# Patient Record
Sex: Male | Born: 1940 | Race: Black or African American | Hispanic: No | Smoking: Never smoker
Health system: Southern US, Community
[De-identification: ages and names within clinical notes are randomized; demographics above are authoritative.]

## PROBLEM LIST (undated history)

## (undated) DIAGNOSIS — Z972 Presence of dental prosthetic device (complete) (partial): Secondary | ICD-10-CM

## (undated) DIAGNOSIS — R972 Elevated prostate specific antigen [PSA]: Secondary | ICD-10-CM

## (undated) DIAGNOSIS — R351 Nocturia: Secondary | ICD-10-CM

## (undated) DIAGNOSIS — Z9189 Other specified personal risk factors, not elsewhere classified: Secondary | ICD-10-CM

## (undated) DIAGNOSIS — B999 Unspecified infectious disease: Secondary | ICD-10-CM

## (undated) DIAGNOSIS — C61 Malignant neoplasm of prostate: Secondary | ICD-10-CM

## (undated) DIAGNOSIS — I509 Heart failure, unspecified: Secondary | ICD-10-CM

## (undated) DIAGNOSIS — I10 Essential (primary) hypertension: Secondary | ICD-10-CM

## (undated) HISTORY — PX: LUMBAR SPINE SURGERY: SHX701

## (undated) HISTORY — PX: INGUINAL HERNIA REPAIR: SUR1180

---

## 2014-09-06 ENCOUNTER — Other Ambulatory Visit: Payer: Self-pay | Admitting: Urology

## 2014-09-15 ENCOUNTER — Encounter (HOSPITAL_BASED_OUTPATIENT_CLINIC_OR_DEPARTMENT_OTHER): Payer: Self-pay | Admitting: *Deleted

## 2014-09-15 NOTE — Progress Notes (Signed)
NPO AFTER MN. ARRIVE AT 0715. NEEDS ISTAT AND EKG. WILL DO FLEET ENEMA AM DOS.  PT TO BRING NAME OF BP MED. WHICH HE TAKES IN THE EVENING.

## 2014-09-15 NOTE — Progress Notes (Signed)
   09/15/14 1241  OBSTRUCTIVE SLEEP APNEA  Have you ever been diagnosed with sleep apnea through a sleep study? No  Do you snore loudly (loud enough to be heard through closed doors)?  1  Do you often feel tired, fatigued, or sleepy during the daytime? 0  Has anyone observed you stop breathing during your sleep? 0  Do you have, or are you being treated for high blood pressure? 1  BMI more than 35 kg/m2? 0  Age over 72 years old? 1  Gender: 1  Obstructive Sleep Apnea Score 4  Score 4 or greater  Results sent to PCP

## 2014-09-16 ENCOUNTER — Encounter (HOSPITAL_BASED_OUTPATIENT_CLINIC_OR_DEPARTMENT_OTHER)
Admission: RE | Disposition: A | Discharge status: Home / Self Care | Payer: Self-pay | Source: Ambulatory Visit | Attending: Urology

## 2014-09-16 ENCOUNTER — Ambulatory Visit (HOSPITAL_BASED_OUTPATIENT_CLINIC_OR_DEPARTMENT_OTHER): Payer: Medicare Other | Admitting: Anesthesiology

## 2014-09-16 ENCOUNTER — Encounter (HOSPITAL_BASED_OUTPATIENT_CLINIC_OR_DEPARTMENT_OTHER): Payer: Self-pay | Admitting: *Deleted

## 2014-09-16 ENCOUNTER — Ambulatory Visit (HOSPITAL_BASED_OUTPATIENT_CLINIC_OR_DEPARTMENT_OTHER)
Admission: RE | Admit: 2014-09-16 | Discharge: 2014-09-16 | Disposition: A | Discharge status: Home / Self Care | Payer: Medicare Other | Source: Ambulatory Visit | Attending: Urology | Admitting: Urology

## 2014-09-16 DIAGNOSIS — C61 Malignant neoplasm of prostate: Secondary | ICD-10-CM | POA: Diagnosis not present

## 2014-09-16 DIAGNOSIS — I10 Essential (primary) hypertension: Secondary | ICD-10-CM | POA: Insufficient documentation

## 2014-09-16 DIAGNOSIS — R972 Elevated prostate specific antigen [PSA]: Secondary | ICD-10-CM | POA: Diagnosis present

## 2014-09-16 HISTORY — DX: Other specified personal risk factors, not elsewhere classified: Z91.89

## 2014-09-16 HISTORY — DX: Nocturia: R35.1

## 2014-09-16 HISTORY — PX: PROSTATE BIOPSY: SHX241

## 2014-09-16 HISTORY — DX: Presence of dental prosthetic device (complete) (partial): Z97.2

## 2014-09-16 HISTORY — DX: Essential (primary) hypertension: I10

## 2014-09-16 HISTORY — DX: Elevated prostate specific antigen (PSA): R97.20

## 2014-09-16 LAB — POCT I-STAT 4, (NA,K, GLUC, HGB,HCT)
Glucose, Bld: 97 mg/dL (ref 70–99)
HEMATOCRIT: 45 % (ref 39.0–52.0)
HEMOGLOBIN: 15.3 g/dL (ref 13.0–17.0)
Potassium: 4.9 mEq/L (ref 3.7–5.3)
SODIUM: 141 meq/L (ref 137–147)

## 2014-09-16 SURGERY — BIOPSY, PROSTATE, RECTAL APPROACH, WITH US GUIDANCE
Anesthesia: Monitor Anesthesia Care | Site: Prostate

## 2014-09-16 MED ORDER — MIDAZOLAM HCL 2 MG/2ML IJ SOLN
INTRAMUSCULAR | Status: AC
  Filled 2014-09-16: qty 2

## 2014-09-16 MED ORDER — LIDOCAINE HCL (CARDIAC) 20 MG/ML IV SOLN
INTRAVENOUS | Status: DC | PRN
  Administered 2014-09-16: 50 mg via INTRAVENOUS

## 2014-09-16 MED ORDER — FENTANYL CITRATE 0.05 MG/ML IJ SOLN
25.0000 ug | INTRAMUSCULAR | Status: DC | PRN
  Filled 2014-09-16: qty 1

## 2014-09-16 MED ORDER — FENTANYL CITRATE 0.05 MG/ML IJ SOLN
INTRAMUSCULAR | Status: DC | PRN
  Administered 2014-09-16: 100 ug via INTRAVENOUS

## 2014-09-16 MED ORDER — CIPROFLOXACIN IN D5W 400 MG/200ML IV SOLN
400.0000 mg | INTRAVENOUS | Status: DC
  Filled 2014-09-16: qty 200

## 2014-09-16 MED ORDER — LACTATED RINGERS IV SOLN
INTRAVENOUS | Status: DC
  Administered 2014-09-16: 09:00:00 via INTRAVENOUS
  Filled 2014-09-16: qty 1000

## 2014-09-16 MED ORDER — MIDAZOLAM HCL 5 MG/5ML IJ SOLN
INTRAMUSCULAR | Status: DC | PRN
  Administered 2014-09-16: 2 mg via INTRAVENOUS

## 2014-09-16 MED ORDER — ONDANSETRON HCL 4 MG/2ML IJ SOLN
4.0000 mg | Freq: Once | INTRAMUSCULAR | Status: DC | PRN
  Filled 2014-09-16: qty 2

## 2014-09-16 MED ORDER — FENTANYL CITRATE 0.05 MG/ML IJ SOLN
INTRAMUSCULAR | Status: AC
  Filled 2014-09-16: qty 2

## 2014-09-16 MED ORDER — EPHEDRINE SULFATE 50 MG/ML IJ SOLN
INTRAMUSCULAR | Status: DC | PRN
  Administered 2014-09-16 (×2): 10 mg via INTRAVENOUS
  Administered 2014-09-16: 5 mg via INTRAVENOUS

## 2014-09-16 MED ORDER — PROPOFOL 10 MG/ML IV EMUL
INTRAVENOUS | Status: DC | PRN
  Administered 2014-09-16: 75 ug/kg/min via INTRAVENOUS

## 2014-09-16 SURGICAL SUPPLY — 20 items
BAG DRN ANRFLXCHMBR STRAP LEK (BAG)
BAG URINE LEG 19OZ MD ST LTX (BAG) IMPLANT
CATH FOLEY 2WAY SLVR  5CC 16FR (CATHETERS)
CATH FOLEY 2WAY SLVR 5CC 16FR (CATHETERS) IMPLANT
DRESSING TELFA 8X3 (GAUZE/BANDAGES/DRESSINGS) ×1 IMPLANT
DRSG TEGADERM 4X4.75 (GAUZE/BANDAGES/DRESSINGS) IMPLANT
DRSG TEGADERM 8X12 (GAUZE/BANDAGES/DRESSINGS) IMPLANT
GAUZE SPONGE 4X4 12PLY STRL LF (GAUZE/BANDAGES/DRESSINGS) IMPLANT
GLOVE BIO SURGEON STRL SZ7 (GLOVE) ×3 IMPLANT
NDL SAFETY ECLIPSE 18X1.5 (NEEDLE) IMPLANT
NDL SPNL 22GX7 QUINCKE BK (NEEDLE) IMPLANT
NEEDLE HYPO 18GX1.5 SHARP (NEEDLE)
NEEDLE SPNL 22GX7 QUINCKE BK (NEEDLE) IMPLANT
PLUG CATH AND CAP STER (CATHETERS) IMPLANT
SET IRRIG Y TYPE TUR BLADDER L (SET/KITS/TRAYS/PACK) IMPLANT
SURGILUBE 2OZ TUBE FLIPTOP (MISCELLANEOUS) ×3 IMPLANT
SYR 20CC LL (SYRINGE) IMPLANT
SYRINGE 10CC LL (SYRINGE) IMPLANT
TOWEL OR 17X24 6PK STRL BLUE (TOWEL DISPOSABLE) ×3 IMPLANT
UNDERPAD 30X30 INCONTINENT (UNDERPADS AND DIAPERS) ×5 IMPLANT

## 2014-09-16 NOTE — H&P (Signed)
History of Present Illness Austin Oconnor is referred by Dr Collier Flowers. He was found to have a significantly elevated PSA on 07/20/14. It is 15.01. He states that he never had a PSA before. He has been having hesitancy, nocturia x 3 for the past 6-7 months. He denies dysuria, hematuria. One nephew died of prostate cancer at age 72.   Past Medical History Problems  1. History of hypertension (Z86.79)  Surgical History Problems  1. History of Back Surgery 2. History of Hernia Repair  Current Meds 1. Lisinopril 20 MG Oral Tablet;  Therapy: (Recorded:21Oct2015) to Recorded 2. Multi-Vitamin TABS;  Therapy: (Recorded:21Oct2015) to Recorded  Allergies Medication  1. No Known Drug Allergies  Family History Problems  1. Family history of myocardial infarction (Z82.49) : Father  Social History Problems    Caffeine use (F15.90)   Father deceased   Mother deceased   Never a smoker   Number of children   Retired  Review of Systems Genitourinary, constitutional, skin, eye, otolaryngeal, hematologic/lymphatic, cardiovascular, pulmonary, endocrine, musculoskeletal, gastrointestinal, neurological and psychiatric system(s) were reviewed and pertinent findings if present are noted.  Genitourinary: urinary frequency, nocturia, difficulty starting the urinary stream, weak urinary stream, urinary stream starts and stops, erectile dysfunction and initiating urination requires straining.  Eyes: blurred vision and diplopia.  ENT: sore throat and sinus problems.  Musculoskeletal: joint pain.    Vitals Vital Signs [Data Includes: Last 1 Day]  Recorded: 21Oct2015 10:36AM  Height: 5 ft 7 in Weight: 161 lb  BMI Calculated: 25.22 BSA Calculated: 1.84 Blood Pressure: 167 / 71 Heart Rate: 56 Respiration: 18  Physical Exam Constitutional: Well nourished and well developed . No acute distress.  ENT:. The ears and nose are normal in appearance.  Neck: The appearance of the neck is normal  and no neck mass is present.  Pulmonary: No respiratory distress and normal respiratory rhythm and effort.  Cardiovascular: Heart rate and rhythm are normal . No peripheral edema.  Abdomen: The abdomen is soft and nontender. No masses are palpated. No CVA tenderness. No hernias are palpable. No hepatosplenomegaly noted.  Rectal: The perineum is normal on inspection. He has a very tight sphincter. He complained of severe pain. I could not introduce my index finger in the rectum.  Genitourinary: Examination of the penis demonstrates no discharge, no masses, no lesions and a normal meatus. The scrotum is without lesions. The right epididymis is palpably normal and non-tender. The left epididymis is palpably normal and non-tender. The right testis is non-tender and without masses. The left testis is non-tender and without masses.  Lymphatics: The femoral and inguinal nodes are not enlarged or tender.  Skin: Normal skin turgor, no visible rash and no visible skin lesions.  Neuro/Psych:. Mood and affect are appropriate.    Results/Data Urine [Data Includes: Last 1 Day]   21Oct2015  COLOR STRAW   APPEARANCE CLEAR   SPECIFIC GRAVITY 1.010   pH 5.5   GLUCOSE NEG mg/dL  BILIRUBIN NEG   KETONE NEG mg/dL  BLOOD NEG   PROTEIN NEG mg/dL  UROBILINOGEN 0.2 mg/dL  NITRITE NEG   LEUKOCYTE ESTERASE NEG    Assessment Assessed  1. Elevated prostate specific antigen (PSA) (R97.2)  Plan Health Maintenance  1. UA With REFLEX; [Do Not Release]; Status:Complete;   Done: 21Oct2015 10:18AM  He needs prostate biopsy. That needs to be done under general anesthesia. The procedure, risks, benefits were explained to the patient and his son and daughter. The risks include but are not  limited to hemorrhage: blood in the urine, stools, semen, infection, sepsis. They understand and are agreeable.

## 2014-09-16 NOTE — Discharge Instructions (Signed)
Post Anesthesia Home Care Instructions  Activity: Get plenty of rest for the remainder of the day. A responsible adult should stay with you for 24 hours following the procedure.  For the next 24 hours, DO NOT: -Drive a car -Paediatric nurse -Drink alcoholic beverages -Take any medication unless instructed by your physician -Make any legal decisions or sign important papers.  Meals: Start with liquid foods such as gelatin or soup. Progress to regular foods as tolerated. Avoid greasy, spicy, heavy foods. If nausea and/or vomiting occur, drink only clear liquids until the nausea and/or vomiting subsides. Call your physician if vomiting continues.  Special Instructions/Symptoms: Your throat may feel dry or sore from the anesthesia or the breathing tube placed in your throat during surgery. If this causes discomfort, gargle with warm salt water. The discomfort should disappear within 24 hours.  Transurethral Resection of the Prostate Care After Refer to this sheet in the next few weeks. These instructions provide you with information on caring for yourself after your procedure. Your caregiver also may give you specific instructions. Your treatment has been planned according to current medical practices, but complications sometimes occur. Call your caregiver if you have any problems or questions after your procedure. HOME CARE INSTRUCTIONS  Recovery can take 4-6 weeks. Avoid alcohol, caffeinated drinks, and spicy foods for 2 weeks after your procedure. Drink enough fluids to keep your urine clear or pale yellow. Urinate as soon as you feel the urge to do so. Do not try to hold your urine for long periods of time. During recovery you may experience pain caused by bladder spasms, which result in a very intense urge to urinate. Take all medicines as directed by your caregiver, including medicines for pain. Try to limit the amount of pain medicines you take because it can cause constipation. If you do  become constipated, do not strain to move your bowels. Straining can increase bleeding. Constipation can be minimized by increasing the amount fluids and fiber in your diet. Your caregiver also may prescribe a stool softener. Do not lift heavy objects (more than 5 lb [2.25 kg]) or perform exercises that cause you to strain for at least 1 month after your procedure. When sitting, you may want to sit in a soft chair or use a cushion. For the first 10 days after your procedure, avoid the following activities:  Running.  Strenuous work.  Long walks.  Riding in a car for extended periods.  Sex. SEEK MEDICAL CARE IF:  You have difficulty urinating.  You have blood in your urine that does not go away after you rest or increase your fluid intake.  You have swelling in your penis or scrotum. SEEK IMMEDIATE MEDICAL CARE IF:   You are suddenly unable to urinate.  You notice blood clots in your urine.  You have chills.  You have a fever.  You have pain in your back or lower abdomen.  You have pain or swelling in your legs. MAKE SURE YOU:   Understand these instructions.  Will watch your condition.  Will get help right away if you are not doing well or get worse. Document Released: 10/28/2005 Document Revised: 07/22/2012 Document Reviewed: 12/06/2011 [facility] Patient Information 2015 [place], [place]. This information is not intended to replace advice given to you by your health care provider. Make sure you discuss any questions you have with your health care provider. Transrectal Ultrasound-Guided Biopsy A transrectal ultrasound-guided biopsy is a procedure to take samples of tissue from your prostate. Ultrasound  images are used to guide the procedure. It is usually done to check the prostate gland for cancer. BEFORE THE PROCEDURE  Do not eat or drink after midnight on the night before your procedure.  Take medicines as your doctor tells you.  Your doctor may have you stop taking  some medicines 5-7 days before the procedure.  You will be given an enema before your procedure. During an enema, a liquid is put into your butt (rectum) to clear out waste.  You may have lab tests the day of your procedure.  Make plans to have someone drive you home. PROCEDURE  You will be given medicine to help you relax before the procedure. An IV tube will be put into one of your veins. It will be used to give fluids and medicine.  You will be given medicine to reduce the risk of infection (antibiotic).  You will be placed on your side.  A probe with gel will be put in your butt. This is used to take pictures of your prostate and the area around it.  A medicine to numb the area is put into your prostate.  A biopsy needle is then inserted and guided to your prostate.  Samples of prostate tissue are taken. The needle is removed.  The samples are sent to a lab to be checked. Results are usually back in 2-3 days. AFTER THE PROCEDURE  You will be taken to a room where you will be watched until you are doing okay.  You may have some pain in the area around your butt. You will be given medicines for this.  You may be able to go home the same day. Sometimes, an overnight stay in the hospital is needed. Document Released: 10/16/2009 Document Revised: 11/02/2013 Document Reviewed: 06/16/2013 [facility] Patient Information 2015 [place], [place]. This information is not intended to replace advice given to you by your health care provider. Make sure you discuss any questions you have with your health care provider.

## 2014-09-16 NOTE — Op Note (Signed)
Austin Oconnor is a 73 y.o.   09/16/2014  Monitor Anesthesia Care  Preop diagnosis: Elevated PSA  Postop diagnosis: Same  Procedure done: Ultrasound guided prostate biopsy  Surgeon: Charlene Brooke. [name]  Anesthesia: Monitored anesthesia care  Indication:patient is a 73 years old male who was found to have a significantly elevated PSA of 15.01 by his primary care physician on 07/20/2014.  I could not perform a digital rectal examination because he has a tight anal sphincter.  He is scheduled today for ultrasound prostate biopsy.  Procedure:The patient was identified by his wrist band and proper timeout was taken.  A 10 MHz transrectal ultrasound probe was inserted in the rectum. The seminal vesicles appear normal. There are several calcifications at the base and mid gland. There is no evidence of hypoechoic nodules. The prostate width is 4.78 cm. The height is 3.78 cm. The length is 4.45 cm. The prostate volume is 42.18 cm.  2 biopsies were taken of the right apex: One at the right lateral and the other one at the right medial apex. 2 biopsies of the right mid gland were done: 1 at the right mid lateral and one at the medial mid gland. Then 2 biopsies of the right base were done: 1 at the right lateral and one at the right medial base.  Then 2 biopsies of the left apex were done: 1 at the lateral apex and one at the medial apex. 2 biopsies of left mid gland were done: 1 on the lateral mid gland and one at the medial mid gland. 2 biopsies of the left base were done: 1 at the lateral base and one at the medial base.  There was no evidence of bleeding at the end of the procedure. The ultrasound probe was then removed.  Patient tolerated procedure well left the OR in satisfactory condition to postanesthesia care unit.

## 2014-09-16 NOTE — Anesthesia Postprocedure Evaluation (Signed)
  Anesthesia Post-op Note  Patient: Austin Oconnor  Procedure(s) Performed: Procedure(s) (LRB): BIOPSY TRANSRECTAL ULTRASONIC PROSTATE (TUBP) (N/A)  Patient Location: PACU  Anesthesia Type: MAC  Level of Consciousness: awake and alert   Airway and Oxygen Therapy: Patient Spontanous Breathing  Post-op Pain: mild  Post-op Assessment: Post-op Vital signs reviewed, Patient's Cardiovascular Status Stable, Respiratory Function Stable, Patent Airway and No signs of Nausea or vomiting  Last Vitals:  Filed Vitals:   09/16/14 1000  BP: 114/59  Pulse:   Temp:   Resp:     Post-op Vital Signs: stable   Complications: No apparent anesthesia complications

## 2014-09-16 NOTE — Transfer of Care (Signed)
Immediate Anesthesia Transfer of Care Note  Patient: Austin Oconnor  Procedure(s) Performed: Procedure(s): BIOPSY TRANSRECTAL ULTRASONIC PROSTATE (TUBP) (N/A)  Patient Location: PACU  Anesthesia Type:General  Level of Consciousness: sedated and responds to stimulation  Airway & Oxygen Therapy: Patient Spontanous Breathing and Patient connected to face mask oxygen  Post-op Assessment: Report given to PACU RN  Post vital signs: Reviewed and stable  Complications: No apparent anesthesia complications

## 2014-09-16 NOTE — Anesthesia Preprocedure Evaluation (Addendum)
Anesthesia Evaluation  Patient identified by MRN, date of birth, ID band Patient awake    Reviewed: Allergy & Precautions, H&P , NPO status , Patient's Chart, lab work & pertinent test results  History of Anesthesia Complications Negative for: history of anesthetic complications  Airway Mallampati: III  TM Distance: >3 FB Neck ROM: Full    Dental  (+) Partial Lower, Partial Upper, Dental Advisory Given   Pulmonary neg pulmonary ROS,  breath sounds clear to auscultation  Pulmonary exam normal       Cardiovascular Exercise Tolerance: Good hypertension, Pt. on medications Rhythm:Regular Rate:Normal     Neuro/Psych negative neurological ROS  negative psych ROS   GI/Hepatic negative GI ROS, Neg liver ROS,   Endo/Other  negative endocrine ROS  Renal/GU negative Renal ROS  negative genitourinary   Musculoskeletal negative musculoskeletal ROS (+)   Abdominal   Peds negative pediatric ROS (+)  Hematology negative hematology ROS (+)   Anesthesia Other Findings   Reproductive/Obstetrics negative OB ROS                           Anesthesia Physical Anesthesia Plan  ASA: II  Anesthesia Plan: MAC   Post-op Pain Management:    Induction: Intravenous  Airway Management Planned: Nasal Cannula  Additional Equipment:   Intra-op Plan:   Post-operative Plan: Extubation in OR  Informed Consent: I have reviewed the patients History and Physical, chart, labs and discussed the procedure including the risks, benefits and alternatives for the proposed anesthesia with the patient or authorized representative who has indicated his/her understanding and acceptance.   Dental advisory given  Plan Discussed with: CRNA  Anesthesia Plan Comments:        Anesthesia Quick Evaluation

## 2014-09-19 ENCOUNTER — Encounter (HOSPITAL_BASED_OUTPATIENT_CLINIC_OR_DEPARTMENT_OTHER): Payer: Self-pay | Admitting: Urology

## 2014-09-22 ENCOUNTER — Other Ambulatory Visit (HOSPITAL_COMMUNITY): Payer: Self-pay | Admitting: Urology

## 2014-09-22 DIAGNOSIS — C61 Malignant neoplasm of prostate: Secondary | ICD-10-CM

## 2014-09-30 ENCOUNTER — Encounter (HOSPITAL_COMMUNITY)
Admission: RE | Admit: 2014-09-30 | Discharge: 2014-09-30 | Disposition: A | Discharge status: Home / Self Care | Payer: Medicare Other | Source: Ambulatory Visit | Attending: Urology | Admitting: Urology

## 2014-09-30 DIAGNOSIS — C61 Malignant neoplasm of prostate: Secondary | ICD-10-CM | POA: Insufficient documentation

## 2014-09-30 MED ORDER — TECHNETIUM TC 99M MEDRONATE IV KIT
26.4000 | PACK | Freq: Once | INTRAVENOUS | Status: AC | PRN
  Administered 2014-09-30: 26.4 via INTRAVENOUS

## 2014-10-04 ENCOUNTER — Other Ambulatory Visit (HOSPITAL_COMMUNITY): Payer: Self-pay | Admitting: Urology

## 2014-10-04 ENCOUNTER — Ambulatory Visit (HOSPITAL_COMMUNITY)
Admission: RE | Admit: 2014-10-04 | Discharge: 2014-10-04 | Disposition: A | Discharge status: Home / Self Care | Payer: Medicare Other | Source: Ambulatory Visit | Attending: Urology | Admitting: Urology

## 2014-10-04 DIAGNOSIS — C61 Malignant neoplasm of prostate: Secondary | ICD-10-CM | POA: Insufficient documentation

## 2014-10-11 ENCOUNTER — Other Ambulatory Visit (HOSPITAL_COMMUNITY): Payer: Self-pay | Admitting: Urology

## 2014-10-11 DIAGNOSIS — R948 Abnormal results of function studies of other organs and systems: Secondary | ICD-10-CM

## 2014-10-24 ENCOUNTER — Ambulatory Visit (HOSPITAL_COMMUNITY)
Admission: RE | Admit: 2014-10-24 | Discharge: 2014-10-24 | Disposition: A | Discharge status: Home / Self Care | Payer: Medicare Other | Source: Ambulatory Visit | Attending: Urology | Admitting: Urology

## 2014-10-24 DIAGNOSIS — Z8546 Personal history of malignant neoplasm of prostate: Secondary | ICD-10-CM | POA: Insufficient documentation

## 2014-10-24 DIAGNOSIS — R948 Abnormal results of function studies of other organs and systems: Secondary | ICD-10-CM | POA: Diagnosis not present

## 2014-10-24 DIAGNOSIS — M25551 Pain in right hip: Secondary | ICD-10-CM | POA: Diagnosis not present

## 2014-10-24 LAB — POCT I-STAT CREATININE: CREATININE: 0.8 mg/dL (ref 0.50–1.35)

## 2014-10-24 MED ORDER — GADOBENATE DIMEGLUMINE 529 MG/ML IV SOLN
15.0000 mL | Freq: Once | INTRAVENOUS | Status: AC | PRN
  Administered 2014-10-24: 15 mL via INTRAVENOUS

## 2014-10-28 ENCOUNTER — Telehealth: Payer: Self-pay | Admitting: Oncology

## 2014-10-28 NOTE — Telephone Encounter (Signed)
LEFT MESSAGE FOR PATIENT TO RETURN CALL TO SCHEDULE NP APPT [phone]

## 2014-11-02 ENCOUNTER — Telehealth: Payer: Self-pay | Admitting: Oncology

## 2014-11-02 NOTE — Telephone Encounter (Signed)
LEFT MESSAGE FOR PATIENT TO RETURN CALL TO SCHEDULE NP APPT.  °

## 2014-11-17 ENCOUNTER — Ambulatory Visit (HOSPITAL_BASED_OUTPATIENT_CLINIC_OR_DEPARTMENT_OTHER): Payer: Medicare Other | Admitting: Oncology

## 2014-11-17 ENCOUNTER — Other Ambulatory Visit: Payer: Medicare Other

## 2014-11-17 ENCOUNTER — Ambulatory Visit: Payer: Medicare Other

## 2014-11-17 ENCOUNTER — Encounter (INDEPENDENT_AMBULATORY_CARE_PROVIDER_SITE_OTHER): Payer: Self-pay

## 2014-11-17 ENCOUNTER — Encounter: Payer: Self-pay | Admitting: Oncology

## 2014-11-17 VITALS — BP 152/73 | HR 68 | Temp 97.7°F | Resp 18 | Ht 68.0 in | Wt 162.4 lb

## 2014-11-17 DIAGNOSIS — M259 Joint disorder, unspecified: Secondary | ICD-10-CM

## 2014-11-17 DIAGNOSIS — C61 Malignant neoplasm of prostate: Secondary | ICD-10-CM

## 2014-11-17 NOTE — Consult Note (Signed)
Reason for Referral: Prostate cancer.   HPI: 74 year old gentleman currently of [place] where he lived the majority of his life. He is a rather healthy gentleman without any significant comorbid conditions and recently evaluated by Dr. Janice Norrie. For an elevated PSA of 15.01. He also have had symptoms of nocturia and hesitancy and subsequently underwent a prostate biopsy on 09/16/2014. 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 Bone scan for staging purposes was obtained on 09/30/2014 which showed increased uptake in the intertrochanteric region of the left femur but really no other widespread disease. He underwent a MRI of the pelvis which showed multifocal osseous disease with a larger lesion in the anterior trochanteric region of the proximal left femur and predisposing that lesion to pathological fracture. Patient was offered androgen deprivation and bone directed therapy but refused and I was asked to comment about the role of chemotherapy in his care. Clinically, he is asymptomatic at this time. He is no longer reporting any major urinary symptoms. He does not report any frequency or urgency. His nocturia has improved. He does not report any headaches or blurry vision or syncope. He does not report any fevers, chills, weight loss or appetite changes. He does not report any chest pain, shortness of breath, cough or hemoptysis. He does not report any nausea, vomiting, abdominal pain, hematochezia or melena. He does not report any palpitation or leg edema. He does not report any hematuria or dysuria. He does not report any skeletal complaints. He does not report any hip pain back pain or any shoulder pain. He is able to ambulate without any difficulties. Rest of his review of systems unremarkable.   Past Medical History  Diagnosis Date  . Elevated PSA   . Hypertension   . Nocturia   . Wears partial dentures   . At risk for sleep  apnea     STOP-BANG= 4      :  Past Surgical History  Procedure Laterality Date  . Inguinal hernia repair Bilateral 1990  &  1960s  . Lumbar spine surgery  age 37's  . Prostate biopsy N/A 09/16/2014    Procedure: BIOPSY TRANSRECTAL ULTRASONIC PROSTATE (TUBP);  Surgeon: Arvil Persons, MD;  Location: [facility];  Service: Urology;  Laterality: N/A;  :   Current Outpatient Prescriptions  Medication Sig Dispense Refill  . Cholecalciferol (VITAMIN D3) 1000 UNITS CAPS Take 1 capsule by mouth daily.    Marland Kitchen lisinopril (PRINIVIL,ZESTRIL) 20 MG tablet Take 20 mg by mouth daily.    . Multiple Vitamin (MULTIVITAMIN) tablet Take 1 tablet by mouth daily.    . vitamin B-12 (CYANOCOBALAMIN) 1000 MCG tablet Take 1,000 mcg by mouth daily.     No current facility-administered medications for this visit.     No Known Allergies:  No family history on file.:  History   Social History  . Marital Status: Widowed    Spouse Name: N/A    Number of Children: N/A  . Years of Education: N/A   Occupational History  . Not on file.   Social History Main Topics  . Smoking status: Never Smoker   . Smokeless tobacco: Never Used  . Alcohol Use: No  . Drug Use: No  . Sexual Activity: Not on file   Other Topics Concern  . Not on file   Social History Narrative  :  Pertinent items are noted in HPI.  Exam:  Blood pressure 152/73, pulse 68, temperature 97.7 F (36.5 C), temperature source Oral, resp. rate 18, height 5\' 8"  (1.727 m), weight 162 lb 6.4 oz (73.664 kg), SpO2 99 %. General appearance: alert and cooperative Head: Normocephalic, without obvious abnormality Throat: lips, mucosa, and tongue normal; teeth and gums normal Neck: no adenopathy Back: negative Resp: clear to auscultation bilaterally Chest wall: no tenderness Cardio: regular rate and rhythm, S1, S2 normal, no murmur, click, rub or gallop GI: soft, non-tender; bowel sounds normal; no masses,  no  organomegaly Extremities: extremities normal, atraumatic, no cyanosis or edema Pulses: 2+ and symmetric Skin: Skin color, texture, turgor normal. No rashes or lesions Lymph nodes: Cervical, supraclavicular, and axillary nodes normal.  CBC    Component Value Date/Time   HGB 15.3 09/16/2014 0851   HCT 45.0 09/16/2014 0851      Chemistry      Component Value Date/Time   NA 141 09/16/2014 0851   K 4.9 09/16/2014 0851   CREATININE 0.80 10/24/2014 1259   No results found for: CALCIUM, ALKPHOS, AST, ALT, BILITOT     Mr Pelvis W Wo Contrast  10/25/2014   CLINICAL DATA:  Right hip pain with abnormal bone scan. History of prostate cancer. No known injury. Initial encounter.  EXAM: MRI PELVIS WITHOUT AND WITH CONTRAST  TECHNIQUE: Multiplanar multisequence MR imaging of the pelvis was performed both before and after administration of intravenous contrast.  CONTRAST:  83mL MULTIHANCE GADOBENATE DIMEGLUMINE 529 MG/ML IV SOLN  COMPARISON:  Whole body bone scan 09/30/2014. Radiographs 10/04/2014  FINDINGS: There is a large lytic lesion in the proximal left femur corresponding with the bone scan abnormality. This involves the intertrochanteric and subtrochanteric regions, partially destroying the cortex posteromedially in the region of the lesser trochanter. The lesion measures approximately 5.4 x 3.2 cm on coronal image number 14 and shows heterogeneous enhancement following contrast and surrounding bone marrow edema. No definite pathologic fracture is demonstrated at this time, although the patient is at risk for that.  Multiple other smaller enhancing metastases are identified throughout the pelvis. There is a tiny lesion in the proximal right femoral diaphysis. No other cortical destruction or impending pathologic fractures demonstrated. There are no soft tissue masses.  The prostate gland demonstrates heterogeneous enlargement and enhancement, measuring approximately 5.1 x 5.2 cm transverse. No enlarged  pelvic lymph nodes demonstrated.  There are degenerative and postsurgical changes within the lower lumbar spine. Both common hamstring tendons demonstrate tendinosis and partial tearing. Small scrotal hydroceles are noted bilaterally.  IMPRESSION: 1. Multifocal osseous metastatic disease. There is a large lesion in the intertrochanteric region of the proximal left femur, corresponding with the bone scan abnormality and predisposing the patient to pathologic fracture. 2. Heterogeneous enlargement and enhancement of the prostate gland consistent with primary prostate cancer. No pelvic adenopathy identified.   Electronically Signed   By: Camie Patience M.D.   On: 10/25/2014 08:43    Assessment and Plan:   74 year old gentleman with the following issues:  1. 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 His diagnosis was made on 09/19/2014. His imaging studies did show metastatic disease into the pelvic bones predominantly in the left hip area. The natural course of this disease was discussed extensively with the patient and his family. I explained to him that the his disease is  incurable but certainly would be manageable intradermal for an extended period of time with androgen deprivation. Different forms of androgen deprivation was discussed with the patient including chemical castration versus surgical castration.  The role of systemic chemotherapy in the hormone sensitive prostate cancer setting was discussed. I feel it ends some value to androgen deprivation but it isn't by itself is unclear whether it adds any value. He had adamantly refused androgen deprivation leading up to this point and it is unclear to me how much chemotherapy would do by itself. The standard of care at this time would be androgen deprivation and anything short of that will definitely would not offer  the best treatment option.  He still very hesitant to proceed with hormone therapy due to the fear of side effects. He is very concerned about sexual function and he is not really sure he would proceed with androgen deprivation knowing that. I explained to him that without androgen deprivation, he is risking further testis is of his cancer and ultimately more suffering and ultimately death from his cancer which is very possible.  For the time being he will continue to think about it and will let me know in the future.  2. Left hip lesion: This is a risk of potential pathological fracture. He is asymptomatic at this time and ideally I would like to have him evaluated by orthopedics as well as radiation oncology. I will make these referrals for him if he is willing to proceed with that.  3. Bone directed therapy: He will certainly benefit from Xgeva to prevent any further bone loss and any skeletal related complications.  He will think about our discussion today and will let me know in the near future and I will proceed per his recommendations.

## 2014-11-17 NOTE — Progress Notes (Signed)
Checked in new pt with no financial concerns at this time.  Pt has 2 insurances so financial assistance may not be needed but he has Raquel's card for any billing questions or concerns.

## 2014-11-17 NOTE — Progress Notes (Signed)
Please see consult note.  

## 2015-12-27 ENCOUNTER — Other Ambulatory Visit: Payer: Self-pay | Admitting: Urology

## 2015-12-27 DIAGNOSIS — C61 Malignant neoplasm of prostate: Secondary | ICD-10-CM

## 2016-01-10 ENCOUNTER — Encounter (HOSPITAL_COMMUNITY)
Admission: RE | Admit: 2016-01-10 | Discharge: 2016-01-10 | Disposition: A | Discharge status: Home / Self Care | Payer: Medicare Other | Source: Ambulatory Visit | Attending: Urology | Admitting: Urology

## 2016-01-10 DIAGNOSIS — R937 Abnormal findings on diagnostic imaging of other parts of musculoskeletal system: Secondary | ICD-10-CM | POA: Diagnosis not present

## 2016-01-10 DIAGNOSIS — C61 Malignant neoplasm of prostate: Secondary | ICD-10-CM | POA: Insufficient documentation

## 2016-01-10 MED ORDER — TECHNETIUM TC 99M MEDRONATE IV KIT
25.0000 | PACK | Freq: Once | INTRAVENOUS | Status: AC | PRN
  Administered 2016-01-10: 25.9 via INTRAVENOUS

## 2016-01-30 NOTE — Progress Notes (Signed)
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 To received Lupron as well as Xgeva every four months.  Pain on a scale of 0-10 is: Denies pain in his left femur. Reports his left leg at the hip becomes sore after he does leg exercises at the [facility].   Reports hot flashes. Gleason 4+5=9. 42 cc volume prostate.   If Spine Met(s), symptoms, if any, include:  Bowel/Bladder retention or incontinence (please describe): strong urine stream, decreased libido and erectile dysfunction. Denies dysuria or hematuria.  Numbness or weakness in extremities (please describe): No  Current Decadron regimen, if applicable: No  Ambulatory status? Walker? Wheelchair?: Ambulatory  SAFETY ISSUES:  Prior radiation?   Pacemaker/ICD? no  Possible current pregnancy? no  Is the patient on methotrexate? no  Current Complaints / other details:  75 year old male. NKDA. Retired. One son and one daughter. Reports hot flashes.

## 2016-02-05 ENCOUNTER — Ambulatory Visit
Admission: RE | Admit: 2016-02-05 | Discharge: 2016-02-05 | Disposition: A | Discharge status: Home / Self Care | Payer: Medicare Other | Source: Ambulatory Visit | Attending: Radiation Oncology | Admitting: Radiation Oncology

## 2016-02-05 ENCOUNTER — Encounter: Payer: Self-pay | Admitting: Radiation Oncology

## 2016-02-05 ENCOUNTER — Ambulatory Visit: Payer: Medicare Other

## 2016-02-05 VITALS — BP 166/81 | HR 52 | Resp 16 | Ht 68.0 in | Wt 159.7 lb

## 2016-02-05 DIAGNOSIS — Z510 Encounter for antineoplastic radiation therapy: Secondary | ICD-10-CM | POA: Diagnosis not present

## 2016-02-05 DIAGNOSIS — R351 Nocturia: Secondary | ICD-10-CM | POA: Diagnosis not present

## 2016-02-05 DIAGNOSIS — C7951 Secondary malignant neoplasm of bone: Secondary | ICD-10-CM | POA: Diagnosis present

## 2016-02-05 DIAGNOSIS — I10 Essential (primary) hypertension: Secondary | ICD-10-CM | POA: Diagnosis not present

## 2016-02-05 DIAGNOSIS — C61 Malignant neoplasm of prostate: Secondary | ICD-10-CM | POA: Diagnosis present

## 2016-02-05 NOTE — Progress Notes (Signed)
Radiation Oncology         [phone] ________________________________  Initial Outpatient Consultation  Name: Austin Oconnor  MRN: [id number]  Date: 02/05/2016  DOB: 1940-12-25  CC:No PCP Per Patient  Austin Gallo, MD   REFERRING PHYSICIAN: Franchot Gallo, MD  DIAGNOSIS: 75 y.o. 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    ICD-9-CM ICD-10-CM   1. Malignant neoplasm of prostate ([place]) 185 C61   2. 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 gentleman.  He was noted to have an elevated PSA of 15.01 by his primary care physician, Dr. Collier Oconnor, on 07/20/14.  Accordingly, he was referred for evaluation in urology by Dr. Janice Oconnor on 09/16/14. A digital rectal examination was unable to be performed at that time due to the patient having a tight anal sphincter.  The patient proceeded to transrectal ultrasound with 12 biopsies of the prostate on 09/16/14.  The prostate volume measured 42 cc.  Out of 12 core biopsies, all were positive.  The maximum Gleason score was 4+5=9 and this was seen in the right base lateral and right base medial.   Bone scan on 09/30/14 revealed evidence of osseous metastatic disease in the intertrochanteric region of the left femur. MRI of the pelvis on 10/24/14 was performed, confirming metastatic involvement in the left intertrochanteric femur and multiple other metastases in the pelvis.  The patient started [place] on 11/28/14 and his first Lupron injection a month later. Delton See was started on 06/06/15. The patient's PSA on 05/05/15 = 0.43, 11/14/14 = 0.25, and 01/10/16 = 0.25 with his testosterone = 20.  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  The patient has kindly been referred today for discussion of potential radiation treatment  options.  PREVIOUS RADIATION THERAPY: No  PAST MEDICAL HISTORY:  has a past medical history of Elevated PSA; Hypertension; Nocturia; Wears partial dentures; and At risk for sleep apnea.    PAST SURGICAL HISTORY: Past Surgical History  Procedure Laterality Date  . Inguinal hernia repair Bilateral 1990  &  1960s  . Lumbar spine surgery  age 26's  . Prostate biopsy N/A 09/16/2014    Procedure: BIOPSY TRANSRECTAL ULTRASONIC PROSTATE (TUBP);  Surgeon: Austin Persons, MD;  Location: [facility];  Service: Urology;  Laterality: N/A;    FAMILY HISTORY: family history is negative for Cancer.  SOCIAL HISTORY:  reports that he has never smoked. He has never used smokeless tobacco. He reports that he does not drink alcohol or use illicit drugs.  ALLERGIES: Review of patient's allergies indicates no known allergies.  MEDICATIONS:  Current Outpatient Prescriptions  Medication Sig Dispense Refill  . Cholecalciferol (VITAMIN D3) 1000 UNITS CAPS Take 1 capsule by mouth daily.    Marland Kitchen lisinopril (PRINIVIL,ZESTRIL) 20 MG tablet Take 20 mg by mouth daily.    . Multiple Vitamin (MULTIVITAMIN) tablet Take 1 tablet by mouth daily.    . vitamin B-12 (CYANOCOBALAMIN) 1000 MCG tablet Take 1,000 mcg by mouth daily.     No current facility-administered medications for this encounter.    REVIEW OF SYSTEMS:  A 15 point review of systems is documented in the electronic medical record. This was obtained by the nursing staff. However, I reviewed this with the patient to discuss relevant  findings and make appropriate changes.  Pertinent items are noted in HPI..  The patient completed an IPSS and IIEF questionnaire.  His IPSS score was 13 indicating moderate urinary outflow obstructive symptoms.  He indicated that his erectile function is able to complete sexual activity on most attempts.  The patient only reports pain in his left leg at the hip after doing leg exercises at the gym.   PHYSICAL EXAM: This  patient is in no acute distress.  He is alert and oriented.   height is 5\' 8"  (1.727 m) and weight is 159 lb 11.2 oz (72.439 kg). His blood pressure is 166/81 and his pulse is 52. His respiration is 16 and oxygen saturation is 99%.  He exhibits no respiratory distress or labored breathing.  He appears neurologically intact.  His mood is pleasant.  His affect is appropriate.    KPS = 100  100 - Normal; no complaints; no evidence of disease. 90   - Able to carry on normal activity; minor signs or symptoms of disease. 80   - Normal activity with effort; some signs or symptoms of disease. 90   - Cares for self; unable to carry on normal activity or to do active work. 60   - Requires occasional assistance, but is able to care for most of his personal needs. 50   - Requires considerable assistance and frequent medical care. 27   - Disabled; requires special care and assistance. 21   - Severely disabled; hospital admission is indicated although death not imminent. 11   - Very sick; hospital admission necessary; active supportive treatment necessary. 10   - Moribund; fatal processes progressing rapidly. 0     - Dead  Karnofsky DA, Abelmann Austin Oconnor, Austin Oconnor LS and Austin Oconnor [facility] [phone]) The use of the nitrogen mustards in the palliative treatment of carcinoma: with particular reference to bronchogenic carcinoma Cancer 1 [phone]   LABORATORY DATA:  Lab Results  Component Value Date   HGB 15.3 09/16/2014   HCT 45.0 09/16/2014   Lab Results  Component Value Date   NA 141 09/16/2014   K 4.9 09/16/2014   No results found for: ALT, AST, GGT, ALKPHOS, BILITOT   RADIOGRAPHY: Nm Bone Scan Whole Body  01/10/2016  CLINICAL DATA:  Prostate cancer complaining of left hip pain. EXAM: NUCLEAR MEDICINE WHOLE BODY BONE SCAN TECHNIQUE: Whole body anterior and posterior images were obtained approximately 3 hours after intravenous injection of radiopharmaceutical. RADIOPHARMACEUTICALS:  25.9 mCi Technetium-4m MDP IV  COMPARISON:  Bone scan 09/30/2014 and MRI pelvis 10/24/2014. FINDINGS: There is a persistent area of increased uptake in the left hip consistent with known metastatic focus. It has much less activity when compared to the prior study and was likely treated. Suspect metastatic disease involving the sternum which appears relatively stable. Diffuse uptake in the spine is likely degenerative. Small focus of uptake in the left ankle is stable and probably related to prior trauma. Asymmetric uptake in the left forearm, wrist and hand may be related to some type of reflex sympathetic dystrophy/ complex regional pain syndrome. IMPRESSION: 1. Persistent uptake in the left hip but much improved since the prior study. This is likely a treated metastasis. 2. 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 3. No findings for new metastatic disease involving the osseous structures. 4. Asymmetric uptake in the left forearm, wrist and hand may be related to complex regional pain syndrome or a vascular abnormality. Electronically Signed   By: Marijo Sanes M.D.   On: 01/10/2016  12:30      IMPRESSION:  75 y.o. 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  The patient may benefit from palliative radiation to the left femur to reduce his risk for pain and fracture risk.  PLAN: Today, I talked to the patient and family about the findings and work-up thus far.  We discussed the natural history of bone metastases and general treatment, highlighting the role of radiotherapy in the management.  We discussed the available radiation techniques, and focused on the details of logistics and delivery.  We reviewed the anticipated acute and late sequelae associated with radiation in this setting.  The patient was encouraged to ask questions that I answered to the best of my ability. The patient would like to proceed with radiation and will be  scheduled for CT simulation Friday morning at 8 AM.  I spent 60 minutes minutes face to face with the patient and more than 50% of that time was spent in counseling and/or coordination of care.   ------------------------------------------------  Sheral Apley. Tammi Klippel, M.D.

## 2016-02-05 NOTE — Progress Notes (Signed)
See progress note under physician encounter. 

## 2016-02-09 ENCOUNTER — Ambulatory Visit
Admission: RE | Admit: 2016-02-09 | Discharge: 2016-02-09 | Disposition: A | Discharge status: Home / Self Care | Payer: Medicare Other | Source: Ambulatory Visit | Attending: Radiation Oncology | Admitting: Radiation Oncology

## 2016-02-09 DIAGNOSIS — C7951 Secondary malignant neoplasm of bone: Secondary | ICD-10-CM

## 2016-02-09 DIAGNOSIS — Z510 Encounter for antineoplastic radiation therapy: Secondary | ICD-10-CM | POA: Diagnosis not present

## 2016-02-09 DIAGNOSIS — C61 Malignant neoplasm of prostate: Secondary | ICD-10-CM

## 2016-02-09 NOTE — Progress Notes (Signed)
  Radiation Oncology         (336) [phone] ________________________________  Name: Austin Oconnor MRN: [id number]  Date: 02/09/2016  DOB: [birth year]  SIMULATION AND TREATMENT PLANNING NOTE    ICD-9-CM ICD-10-CM   1. Malignant neoplasm of prostate ([place]) 185 C61   2. 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  NARRATIVE:  The patient was brought to the [facility].  Identity was confirmed.  All relevant records and images related to the planned course of therapy were reviewed.  The patient freely provided informed written consent to proceed with treatment after reviewing the details related to the planned course of therapy. The consent form was witnessed and verified by the simulation staff.  Then, the patient was set-up in a stable reproducible  supine position for radiation therapy.  CT images were obtained.  Surface markings were placed.  The CT images were loaded into the planning software.  Then the target and avoidance structures were contoured.  Treatment planning then occurred.  The radiation prescription was entered and confirmed.  Then, I designed and supervised the construction of a total of 3 medically necessary complex treatment devices including BodyFix for the legs and two MLCs to shield the testes.  I have requested : Isodose Plan.   PLAN:  The patient will receive 30 Gy in 10 fraction.  ________________________________  Sheral Apley Tammi Klippel, M.D.  This document serves as a record of services personally performed by Tyler Pita, MD. It was created on his behalf by Arlyce Harman, a trained medical scribe. The creation of this record is based on the scribe's personal observations and the provider's statements to them. This document has been checked and approved by the attending provider.

## 2016-02-11 DIAGNOSIS — Z510 Encounter for antineoplastic radiation therapy: Secondary | ICD-10-CM | POA: Diagnosis not present

## 2016-02-13 DIAGNOSIS — Z510 Encounter for antineoplastic radiation therapy: Secondary | ICD-10-CM | POA: Diagnosis not present

## 2016-02-16 ENCOUNTER — Ambulatory Visit
Admission: RE | Admit: 2016-02-16 | Discharge: 2016-02-16 | Disposition: A | Discharge status: Home / Self Care | Payer: Medicare Other | Source: Ambulatory Visit | Attending: Radiation Oncology | Admitting: Radiation Oncology

## 2016-02-16 ENCOUNTER — Ambulatory Visit: Payer: Medicare Other | Admitting: Radiation Oncology

## 2016-02-16 DIAGNOSIS — Z510 Encounter for antineoplastic radiation therapy: Secondary | ICD-10-CM | POA: Diagnosis not present

## 2016-02-19 ENCOUNTER — Ambulatory Visit
Admission: RE | Admit: 2016-02-19 | Discharge: 2016-02-19 | Disposition: A | Discharge status: Home / Self Care | Payer: Medicare Other | Source: Ambulatory Visit | Attending: Radiation Oncology | Admitting: Radiation Oncology

## 2016-02-19 DIAGNOSIS — Z510 Encounter for antineoplastic radiation therapy: Secondary | ICD-10-CM | POA: Diagnosis not present

## 2016-02-20 ENCOUNTER — Ambulatory Visit
Admission: RE | Admit: 2016-02-20 | Discharge: 2016-02-20 | Disposition: A | Discharge status: Home / Self Care | Payer: Medicare Other | Source: Ambulatory Visit | Attending: Radiation Oncology | Admitting: Radiation Oncology

## 2016-02-20 DIAGNOSIS — Z510 Encounter for antineoplastic radiation therapy: Secondary | ICD-10-CM | POA: Diagnosis not present

## 2016-02-21 ENCOUNTER — Ambulatory Visit
Admission: RE | Admit: 2016-02-21 | Discharge: 2016-02-21 | Disposition: A | Discharge status: Home / Self Care | Payer: Medicare Other | Source: Ambulatory Visit | Attending: Radiation Oncology | Admitting: Radiation Oncology

## 2016-02-21 DIAGNOSIS — Z510 Encounter for antineoplastic radiation therapy: Secondary | ICD-10-CM | POA: Diagnosis not present

## 2016-02-22 ENCOUNTER — Ambulatory Visit
Admission: RE | Admit: 2016-02-22 | Discharge: 2016-02-22 | Disposition: A | Discharge status: Home / Self Care | Payer: Medicare Other | Source: Ambulatory Visit | Attending: Radiation Oncology | Admitting: Radiation Oncology

## 2016-02-22 DIAGNOSIS — Z510 Encounter for antineoplastic radiation therapy: Secondary | ICD-10-CM | POA: Diagnosis not present

## 2016-02-23 ENCOUNTER — Ambulatory Visit
Admission: RE | Admit: 2016-02-23 | Discharge: 2016-02-23 | Disposition: A | Discharge status: Home / Self Care | Payer: Medicare Other | Source: Ambulatory Visit | Attending: Radiation Oncology | Admitting: Radiation Oncology

## 2016-02-23 VITALS — BP 122/68 | HR 58 | Resp 16 | Wt 156.1 lb

## 2016-02-23 DIAGNOSIS — Z510 Encounter for antineoplastic radiation therapy: Secondary | ICD-10-CM | POA: Diagnosis not present

## 2016-02-23 DIAGNOSIS — C7951 Secondary malignant neoplasm of bone: Secondary | ICD-10-CM

## 2016-02-23 NOTE — Progress Notes (Addendum)
Weight and vitals stable. Denies pain. Reports his left leg becomes only after working out which he does regularly. Denies numbness or tingling in his left leg. No edema noted in his left leg. Denies any changes in his bowel habits. Denies fatigue. Oriented patient to staff and routine of the clinic. Provided patient with RADIATION THERAPY AND YOU handbook then, reviewed pertinent information. Educated patient reference potential side effect of fatigue and management. Provided patient with my business card and encouraged him to call with needs. Patient verbalized understanding of all reviewed.   BP 122/68 mmHg  Pulse 58  Resp 16  Wt 156 lb 1.6 oz (70.806 kg)  SpO2 100% Wt Readings from Last 3 Encounters:  02/23/16 156 lb 1.6 oz (70.806 kg)  02/05/16 159 lb 11.2 oz (72.439 kg)  11/17/14 162 lb 6.4 oz (73.664 kg)

## 2016-02-26 ENCOUNTER — Ambulatory Visit
Admission: RE | Admit: 2016-02-26 | Discharge: 2016-02-26 | Disposition: A | Discharge status: Home / Self Care | Payer: Medicare Other | Source: Ambulatory Visit | Attending: Radiation Oncology | Admitting: Radiation Oncology

## 2016-02-26 DIAGNOSIS — Z510 Encounter for antineoplastic radiation therapy: Secondary | ICD-10-CM | POA: Diagnosis not present

## 2016-02-26 NOTE — Progress Notes (Signed)
   Department of Radiation Oncology  Phone:  [phone] Fax:        [phone]  Weekly Treatment Note    Name: Austin Oconnor Date: 02/26/2016 MRN: [id number] DOB: 01/26/1941   Diagnosis:     ICD-9-CM ICD-10-CM   1. 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 Cholecalciferol (VITAMIN D3) 1000 UNITS CAPS Take 1 capsule by mouth daily.    Marland Kitchen lisinopril (PRINIVIL,ZESTRIL) 20 MG tablet Take 20 mg by mouth daily.    . Multiple Vitamin (MULTIVITAMIN) tablet Take 1 tablet by mouth daily.    . vitamin B-12 (CYANOCOBALAMIN) 1000 MCG tablet Take 1,000 mcg by mouth daily.     No current facility-administered medications for this encounter.     ALLERGIES: Review of patient's allergies indicates no known allergies.   LABORATORY DATA:  Lab Results  Component Value Date   HGB 15.3 09/16/2014   HCT 45.0 09/16/2014   Lab Results  Component Value Date   NA 141 09/16/2014   K 4.9 09/16/2014   No results found for: ALT, AST, GGT, ALKPHOS, BILITOT   NARRATIVE: JEMALE OMLOR was seen today for weekly treatment management. The chart was checked and the patient's films were reviewed.  Weight and vitals stable. Denies pain. Reports his left leg becomes only after working out which he does regularly. Denies numbness or tingling in his left leg. No edema noted in his left leg. Denies any changes in his bowel habits. Denies fatigue.   BP 122/68 mmHg  Pulse 58  Resp 16  Wt 156 lb 1.6 oz (70.806 kg)  SpO2 100% Wt Readings from Last 3 Encounters:  02/23/16 156 lb 1.6 oz (70.806 kg)  02/05/16 159 lb 11.2 oz (72.439 kg)  11/17/14 162 lb 6.4 oz (73.664 kg)     PHYSICAL EXAMINATION: weight is 156 lb 1.6 oz (70.806 kg). His blood pressure is 122/68 and his pulse is 58. His respiration is 16 and oxygen saturation is 100%.        ASSESSMENT: The patient is doing  satisfactorily with treatment.  PLAN: We will continue with the patient's radiation treatment as planned. The patient is doing well halfway through his course of radiation treatment.

## 2016-02-27 ENCOUNTER — Ambulatory Visit
Admission: RE | Admit: 2016-02-27 | Discharge: 2016-02-27 | Disposition: A | Discharge status: Home / Self Care | Payer: Medicare Other | Source: Ambulatory Visit | Attending: Radiation Oncology | Admitting: Radiation Oncology

## 2016-02-27 DIAGNOSIS — Z510 Encounter for antineoplastic radiation therapy: Secondary | ICD-10-CM | POA: Diagnosis not present

## 2016-02-27 NOTE — Addendum Note (Signed)
Encounter addended by: Heywood Footman, RN on: 02/27/2016 10:20 AM<BR>     Documentation filed: Notes Section, Chief Complaint Section

## 2016-02-28 ENCOUNTER — Ambulatory Visit
Admission: RE | Admit: 2016-02-28 | Discharge: 2016-02-28 | Disposition: A | Discharge status: Home / Self Care | Payer: Medicare Other | Source: Ambulatory Visit | Attending: Radiation Oncology | Admitting: Radiation Oncology

## 2016-02-28 DIAGNOSIS — Z510 Encounter for antineoplastic radiation therapy: Secondary | ICD-10-CM | POA: Diagnosis not present

## 2016-02-29 ENCOUNTER — Ambulatory Visit
Admission: RE | Admit: 2016-02-29 | Discharge: 2016-02-29 | Disposition: A | Discharge status: Home / Self Care | Payer: Medicare Other | Source: Ambulatory Visit | Attending: Radiation Oncology | Admitting: Radiation Oncology

## 2016-02-29 DIAGNOSIS — Z510 Encounter for antineoplastic radiation therapy: Secondary | ICD-10-CM | POA: Diagnosis not present

## 2016-03-01 ENCOUNTER — Ambulatory Visit
Admission: RE | Admit: 2016-03-01 | Discharge: 2016-03-01 | Disposition: A | Discharge status: Home / Self Care | Payer: Medicare Other | Source: Ambulatory Visit | Attending: Radiation Oncology | Admitting: Radiation Oncology

## 2016-03-01 ENCOUNTER — Encounter: Payer: Self-pay | Admitting: Radiation Oncology

## 2016-03-01 VITALS — BP 148/83 | HR 64 | Resp 16 | Wt 157.4 lb

## 2016-03-01 DIAGNOSIS — Z510 Encounter for antineoplastic radiation therapy: Secondary | ICD-10-CM | POA: Diagnosis not present

## 2016-03-01 DIAGNOSIS — C7951 Secondary malignant neoplasm of bone: Secondary | ICD-10-CM

## 2016-03-01 NOTE — Progress Notes (Signed)
  Radiation Oncology         [phone]   Name: Austin Oconnor MRN: [id number]   Date: 03/01/2016  DOB: 1941/04/24     Weekly Radiation Therapy Management    ICD-9-CM ICD-10-CM   1. 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  Weight and vitals stable. Reports aching left femur pain last night that resolved this morning with movement. Denies numbness or tingling in his left leg. No edema noted in his left leg. The left femur pain radiates to the left knee. Denies any changes in his bowel habits. Denies fatigue. One month follow up appointment card given. Patient understands to contact this RN with needs.    The patient is without complaint. Set-up films were reviewed. The chart was checked.  Physical Findings  weight is 157 lb 6.4 oz (71.396 kg). His blood pressure is 148/83 and his pulse is 64. His respiration is 16 and oxygen saturation is 100%. . Weight essentially stable.  No significant changes.  Impression The patient is tolerating radiation.  Plan The patient has completed radiation treatment. He will return in 1 month for follow up.         Sheral Apley Tammi Klippel, M.D.  This document serves as a record of services personally performed by Tyler Pita, MD. It was created on his behalf by Arlyce Harman, a trained medical scribe. The creation of this record is based on the scribe's personal observations and the provider's statements to them. This document has been checked and approved by the attending provider.

## 2016-03-01 NOTE — Progress Notes (Signed)
Weight and vitals stable. Reports aching left femur pain last night that resolved this morning with movement.Denies numbness or tingling in his left leg. No edema noted in his left leg. Denies any changes in his bowel habits. Denies fatigue. One month follow up appointment card given. Patient understands to contact this RN with needs.   BP 148/83 mmHg  Pulse 64  Resp 16  Wt 157 lb 6.4 oz (71.396 kg)  SpO2 100% Wt Readings from Last 3 Encounters:  03/01/16 157 lb 6.4 oz (71.396 kg)  02/23/16 156 lb 1.6 oz (70.806 kg)  02/05/16 159 lb 11.2 oz (72.439 kg)

## 2016-03-15 NOTE — Progress Notes (Signed)
  Radiation Oncology         (336) [phone] ________________________________  Name: Austin Oconnor MRN: [id number]  Date: 03/01/2016  DOB: 12-28-1940  End of Treatment Note  Diagnosis:   75 y.o. 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     Indication for treatment:  Palliative       Radiation treatment dates:   02/19/2016 to 03/01/2016  Site/dose:   The Left femur bone met was treated to 30 Gy in 10 fractions at 3 Gy per fraction.  Beams/energy:   AP/PA (Deprecated) // 15X  Narrative: The patient tolerated radiation treatment relatively well.   He reported left femur pain which radiated to the left knee. He did not experience fatigue, change in bowel habits, or edema in the left leg.   Plan: The patient has completed radiation treatment. The patient will return to radiation oncology clinic for routine followup in one month. I advised him to call or return sooner if he has any questions or concerns related to his recovery or treatment. ________________________________  Sheral Apley. Tammi Klippel, M.D.   This document serves as a record of services personally performed by Tyler Pita, MD. It was created on his behalf by Arlyce Harman, a trained medical scribe. The creation of this record is based on the scribe's personal observations and the provider's statements to them. This document has been checked and approved by the attending provider.

## 2016-03-28 ENCOUNTER — Other Ambulatory Visit (HOSPITAL_COMMUNITY): Payer: Medicare Other | Admitting: Dentistry

## 2016-04-02 ENCOUNTER — Ambulatory Visit (HOSPITAL_COMMUNITY): Payer: Self-pay | Admitting: Dentistry

## 2016-04-02 ENCOUNTER — Encounter (HOSPITAL_COMMUNITY): Payer: Self-pay | Admitting: Dentistry

## 2016-04-02 VITALS — BP 126/66 | HR 58 | Temp 98.0°F

## 2016-04-02 DIAGNOSIS — K036 Deposits [accretions] on teeth: Secondary | ICD-10-CM

## 2016-04-02 DIAGNOSIS — K0850 Unsatisfactory restoration of tooth, unspecified: Secondary | ICD-10-CM

## 2016-04-02 DIAGNOSIS — M264 Malocclusion, unspecified: Secondary | ICD-10-CM

## 2016-04-02 DIAGNOSIS — IMO0002 Reserved for concepts with insufficient information to code with codable children: Secondary | ICD-10-CM

## 2016-04-02 DIAGNOSIS — Z01818 Encounter for other preprocedural examination: Secondary | ICD-10-CM

## 2016-04-02 DIAGNOSIS — M270 Developmental disorders of jaws: Secondary | ICD-10-CM

## 2016-04-02 DIAGNOSIS — K060 Gingival recession: Secondary | ICD-10-CM

## 2016-04-02 DIAGNOSIS — K0851 Open restoration margins of tooth: Secondary | ICD-10-CM

## 2016-04-02 DIAGNOSIS — C61 Malignant neoplasm of prostate: Secondary | ICD-10-CM

## 2016-04-02 DIAGNOSIS — K0530 Chronic periodontitis, unspecified: Secondary | ICD-10-CM

## 2016-04-02 DIAGNOSIS — K031 Abrasion of teeth: Secondary | ICD-10-CM

## 2016-04-02 DIAGNOSIS — K030 Excessive attrition of teeth: Secondary | ICD-10-CM

## 2016-04-02 DIAGNOSIS — K08409 Partial loss of teeth, unspecified cause, unspecified class: Secondary | ICD-10-CM

## 2016-04-02 DIAGNOSIS — C7951 Secondary malignant neoplasm of bone: Secondary | ICD-10-CM

## 2016-04-02 DIAGNOSIS — Z972 Presence of dental prosthetic device (complete) (partial): Secondary | ICD-10-CM

## 2016-04-02 NOTE — Progress Notes (Signed)
DENTAL CONSULTATION  Date of Consultation:  04/02/2016 Patient Name:   Austin Oconnor Date of Birth:   [birth year] Medical Record Number: [id number]  VITALS: BP 126/66 mmHg  Pulse 58  Temp(Src) 98 F (36.7 C) (Oral)  CHIEF COMPLAINT: Patient referred by Dr. Diona Fanti for dental consultation.   HPI: Austin Oconnor is a 75 year old male with metastatic prostate cancer to bone. Patient has been receiving Lupron therapy along with monthly Xgeva therapy. Patient referred for dental consultation by Dr. Franchot Gallo to discuss Xgeva therapy and the risk for medication induced osteonecrosis of the jaw with invasive dental procedures.  Patient currently denies acute toothaches, swellings, or abscesses. Patient is usually seen every 6 months for an exam and cleaning with [facility]. Patient is currently scheduled for a dental cleaning tomorrow at 9:45 AM. Patient has upper acrylic partial denture and lower cast partial denture. The maxillary acrylic partial denture was made approximately 10 years ago. The cast mandibular partial denture was fabricated 20 years ago. Patient indicates that the dentures " fit good". Patient denies having any dental phobia.  PROBLEM LIST: Patient Active Problem List   Diagnosis Date Noted  . Malignant neoplasm of prostate ([place]) 02/05/2016    Priority: Medium  . Bone metastasis ([place]) 02/05/2016    Priority: Medium    PMH: Past Medical History  Diagnosis Date  . Elevated PSA   . Hypertension   . Nocturia   . Wears partial dentures   . At risk for sleep apnea     STOP-BANG= 4        PSH: Past Surgical History  Procedure Laterality Date  . Inguinal hernia repair Bilateral 1990  &  1960s  . Lumbar spine surgery  age 19's  . Prostate biopsy N/A 09/16/2014    Procedure: BIOPSY TRANSRECTAL ULTRASONIC PROSTATE (TUBP);  Surgeon: Arvil Persons, MD;  Location: [facility];  Service: Urology;  Laterality: N/A;    ALLERGIES: No Known  Allergies  MEDICATIONS: Current Outpatient Prescriptions  Medication Sig Dispense Refill  . Cholecalciferol (VITAMIN D3) 1000 UNITS CAPS Take 1 capsule by mouth daily.    Marland Kitchen lisinopril (PRINIVIL,ZESTRIL) 20 MG tablet Take 20 mg by mouth daily.    . Multiple Vitamin (MULTIVITAMIN) tablet Take 1 tablet by mouth daily.    . vitamin B-12 (CYANOCOBALAMIN) 1000 MCG tablet Take 1,000 mcg by mouth daily.     No current facility-administered medications for this visit.    LABS: Lab Results  Component Value Date   HGB 15.3 09/16/2014   HCT 45.0 09/16/2014      Component Value Date/Time   NA 141 09/16/2014 0851   K 4.9 09/16/2014 0851   GLUCOSE 97 09/16/2014 0851   CREATININE 0.80 10/24/2014 1259   No results found for: INR, PROTIME No results found for: PTT  SOCIAL HISTORY: Social History   Social History  . Marital Status: Widowed    Spouse Name: N/A  . Number of Children: N/A  . Years of Education: N/A   Occupational History  . Not on file.   Social History Main Topics  . Smoking status: Never Smoker   . Smokeless tobacco: Never Used  . Alcohol Use: No  . Drug Use: No  . Sexual Activity: Not Currently   Other Topics Concern  . Not on file   Social History Narrative    FAMILY HISTORY: Family History  Problem Relation Age of Onset  . Cancer Neg Hx     REVIEW  OF SYSTEMS: Reviewed with patient as per History of Present Illness. Psych: Patient denies dental phobia.  DENTAL HISTORY: CHIEF COMPLAINT: Patient referred by Dr. Diona Fanti for dental consultation.   HPI: Austin Oconnor is a 75 year old male with metastatic prostate cancer to bone. Patient has been receiving Lupron therapy along with monthly Xgeva therapy. Patient referred for dental consultation by Dr. Franchot Gallo to discuss Xgeva therapy and the risk for medication induced osteonecrosis of the jaw with invasive dental procedures.  Patient currently denies acute toothaches, swellings, or  abscesses. Patient is usually seen every 6 months for an exam and cleaning with [facility]. Patient is currently scheduled for a dental cleaning tomorrow at 9:45 AM. Patient has upper acrylic partial denture and lower cast partial denture. The maxillary acrylic partial denture was made approximately 10 years ago. The mandibular cast partial denture was fabricated 20 years ago. Patient indicates that the dentures " fit good". Patient denies having any dental phobia.  DENTAL EXAMINATION: GENERAL: The patient is a well-developed, well-nourished male in no acute distress. HEAD AND NECK: There is no palpable submandibular lymphadenopathy. The patient denies acute TMJ symptoms. INTRAORAL EXAM: The patient has normal saliva. The patient has bilateral mandibular lingual tori. DENTITION: Patient is missing tooth numbers 1 -4, 10, 14, 15, 17-19, 21, 28, 30-32. Patient has evidence of maxillary and mandibular interincisal attrition. 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 There is incipient to moderate bone loss noted. DENTAL CARIES/SUBOPTIMAL RESTORATIONS: Patient has multiple suboptimal dental restorations. Patient has multiple abfraction lesions.  ENDODONTIC: Patient currently denies acute pulpitis symptoms. I do not see any evidence of periapical pathology or radiolucency. CROWN AND BRIDGE: There are no crown or bridge restorations. PROSTHODONTIC: The patient has a maxillary acrylic partial denture replacing tooth numbers 4 and 10 with clasps on tooth numbers 5 and 13. The patient has a mandibular cast partial denture replacing tooth numbers 18, 19, 21, 28, 30, and 31. The partial dentures " fit good" by patient report. OCCLUSION: Patient has a poor occlusal scheme but a stable occlusion at this time.  RADIOGRAPHIC INTERPRETATION: An orthopantogram was taken and supplemented with  12 periapical radiographs. There are multiple missing teeth. There is supra-eruption and drifting of the unopposed teeth into the edentulous areas. Multiple dental restorations are noted. There is incipient to moderate bone loss. No obvious periapical radiolucencies are noted. There is evidence of maxillary and mandibular interincisal attrition.  ASSESSMENTS: 1. Prostate cancer 2. Bone metastases 3. Xgeva therapy with the risk for osteonecrosis of the jaw with invasive dental procedures 4. Chronic periodontitis with bone loss 5. Gingival recession 6. Accretions 7. Multiple flexure lesions 8. Multiple suboptimal dental restorations. 9. Bilateral mandibular lingual tori 10. Maxillary acrylic and mandibular cast partial denture. 11. Poor occlusal scheme but a stable occlusion  PLAN/RECOMMENDATIONS: 1. I discussed the risks, benefits, and complications of various treatment options with the patient in relationship to his medical and dental conditions, use of Xgeva, and the risk for medication-induced osteonecrosis of the jaw with invasive dental procedures. We discussed various treatment options to include no treatment, and replacement of suboptimal dental restorations, root canal therapy, crown and bridge therapy, implant therapy, and replacement of missing teeth as indicated with new upper or lower partial dentures as indicated.  The patient expresses an understanding of the potential risks for osteonecrosis of the jaw due to the previous Xgeva therapy given and understands that dental extractions,  surgical periodontal procedures, and other invasive dental procedures should not be performed without consideration for discontinuation of Xgeva therapy for approximately 6 months. The patient currently wishes to continue his dental care with [facility] and will have them contact Dental Medicine if questions arise.  2. Discussion of findings with medical team and coordination of future medical and  dental care as needed.  I spent in excess of  90 minutes during the conduct of this consultation and >50% of this time involved direct face-to-face encounter for counseling and/or coordination of the patient's care.    Lenn Cal, DDS

## 2016-04-02 NOTE — Patient Instructions (Signed)
Patient is to continue his dental care with [facility]. Patient scheduled for dental cleaning tomorrow. [facility] will discuss replacement of suboptimal dental restorations as needed. Patient to consider fabrication of new maxillary and mandibular partial dentures as desired. Patient is to avoid dental extractions, surgical periodontal procedures, or other invasive dental procedures that would put the patient at risk for osteonecrosis of the jaw related to [facility] therapy.  Dr. Enrique Sack

## 2016-04-11 ENCOUNTER — Ambulatory Visit
Admission: RE | Admit: 2016-04-11 | Discharge: 2016-04-11 | Disposition: A | Discharge status: Home / Self Care | Payer: Medicare Other | Source: Ambulatory Visit | Attending: Radiation Oncology | Admitting: Radiation Oncology

## 2016-04-11 ENCOUNTER — Encounter: Payer: Self-pay | Admitting: Radiation Oncology

## 2016-04-11 VITALS — BP 133/67 | HR 55 | Resp 16 | Wt 156.1 lb

## 2016-04-11 DIAGNOSIS — C7951 Secondary malignant neoplasm of bone: Secondary | ICD-10-CM | POA: Diagnosis not present

## 2016-04-11 DIAGNOSIS — C801 Malignant (primary) neoplasm, unspecified: Secondary | ICD-10-CM | POA: Diagnosis not present

## 2016-04-11 NOTE — Progress Notes (Signed)
  Radiation Oncology         ([phone]) [phone] ________________________________  Name: Austin Oconnor MRN: [id number]  Date: 04/11/2016  DOB: November 14, 1940    Follow-Up Visit Note  CC: No PCP Per Patient  Franchot Gallo, MD  Diagnosis:   75 y.o. 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     ICD-9-CM ICD-10-CM   1. Bone metastasis ([facility]) 198.5 C79.51     Interval Since Last Radiation:  1  months (02/19/2016 to 03/01/2016)  Narrative:  The patient returns today for routine follow-up.  Weight and vitals stable. Reports left femur pain that radiated to his left knee has resolved completely. Reports occasional cramping in his lower left leg. No edema of lower extremities noted or reported. Denies change of bowel habits. Denies numbness or tingling in lower extremities. Denies fatigue. Received his Xgeva injection this month already. Scheduled for follow up with Dahlstedt and Lupron next month.                               ALLERGIES:  has No Known Allergies.  Meds: Current Outpatient Prescriptions  Medication Sig Dispense Refill  . Cholecalciferol (VITAMIN D3) 1000 UNITS CAPS Take 1 capsule by mouth daily.    Marland Kitchen lisinopril (PRINIVIL,ZESTRIL) 20 MG tablet Take 20 mg by mouth daily.    . Multiple Vitamin (MULTIVITAMIN) tablet Take 1 tablet by mouth daily.    . vitamin B-12 (CYANOCOBALAMIN) 1000 MCG tablet Take 1,000 mcg by mouth daily.     No current facility-administered medications for this encounter.    Physical Findings: The patient is in no acute distress. Patient is alert and oriented.  weight is 156 lb 1.6 oz (70.806 kg). His blood pressure is 133/67 and his pulse is 55. His respiration is 16 and oxygen saturation is 100%. .  No significant changes.  Lab Findings: Lab Results  Component Value Date   HGB 15.3 09/16/2014   HCT 45.0 09/16/2014    Lab Results  Component Value  Date   NA 141 09/16/2014   K 4.9 09/16/2014   GLUCOSE 97 09/16/2014   CREATININE 0.80 10/24/2014    Impression:  The patient is recovering from the effects of radiation.  There is no evidence of recurrence.  Plan:  The patient is doing well. He will be released to urology for follow up.  _____________________________________  Sheral Apley. Tammi Klippel, M.D.   This document serves as a record of services personally performed by Tyler Pita, MD. It was created on his behalf by Arlyce Harman, a trained medical scribe. The creation of this record is based on the scribe's personal observations and the provider's statements to them. This document has been checked and approved by the attending provider.

## 2016-04-11 NOTE — Progress Notes (Signed)
Weight and vitals stable. Reports left femur pain that radiated to his left knee has resolved completely. Reports occasional cramping in his lower left leg. No edema of lower extremities noted or reported. Denies change of bowel habits. Denies numbness or tingling in lower extremities. Denies fatigue. Received his Xgeva injection this month already. Scheduled for follow up with Dahlstedt and Lupron next month.   BP 133/67 mmHg  Pulse 55  Resp 16  Wt 156 lb 1.6 oz (70.806 kg)  SpO2 100% Wt Readings from Last 3 Encounters:  04/11/16 156 lb 1.6 oz (70.806 kg)  03/01/16 157 lb 6.4 oz (71.396 kg)  02/23/16 156 lb 1.6 oz (70.806 kg)

## 2016-09-25 ENCOUNTER — Other Ambulatory Visit: Payer: Self-pay | Admitting: Urology

## 2016-09-25 DIAGNOSIS — C61 Malignant neoplasm of prostate: Secondary | ICD-10-CM

## 2016-10-02 ENCOUNTER — Encounter (HOSPITAL_COMMUNITY)
Admission: RE | Admit: 2016-10-02 | Discharge: 2016-10-02 | Disposition: A | Discharge status: Home / Self Care | Payer: Medicare Other | Source: Ambulatory Visit | Attending: Urology | Admitting: Urology

## 2016-10-02 DIAGNOSIS — C61 Malignant neoplasm of prostate: Secondary | ICD-10-CM | POA: Diagnosis present

## 2016-10-02 MED ORDER — TECHNETIUM TC 99M MEDRONATE IV KIT
25.0000 | PACK | Freq: Once | INTRAVENOUS | Status: DC | PRN
Start: 2016-10-02 — End: 2016-10-08

## 2017-01-04 ENCOUNTER — Encounter: Payer: Self-pay | Admitting: Oncology

## 2017-01-04 ENCOUNTER — Telehealth: Payer: Self-pay | Admitting: Oncology

## 2017-01-04 NOTE — Telephone Encounter (Signed)
Appt has been scheduled for the pt to see Dr. Alen Blew on 3/9 at 2pm. Pt aware to arrive 30 minutes early. Demographics verified. Letter mailed.

## 2017-01-16 ENCOUNTER — Telehealth: Payer: Self-pay | Admitting: *Deleted

## 2017-01-16 NOTE — Telephone Encounter (Signed)
Called patient to remind him of his new patient appointment with Dr. Alen Blew tomorrow at 1:30 pm. Patient verbalized understanding.

## 2017-01-17 ENCOUNTER — Ambulatory Visit (HOSPITAL_BASED_OUTPATIENT_CLINIC_OR_DEPARTMENT_OTHER): Payer: Medicare Other | Admitting: Oncology

## 2017-01-17 ENCOUNTER — Telehealth: Payer: Self-pay | Admitting: Oncology

## 2017-01-17 ENCOUNTER — Encounter: Payer: Self-pay | Admitting: Medical Oncology

## 2017-01-17 VITALS — BP 138/73 | HR 100 | Temp 98.0°F | Ht 68.5 in | Wt 162.2 lb

## 2017-01-17 DIAGNOSIS — E291 Testicular hypofunction: Secondary | ICD-10-CM

## 2017-01-17 DIAGNOSIS — C7951 Secondary malignant neoplasm of bone: Secondary | ICD-10-CM

## 2017-01-17 DIAGNOSIS — C61 Malignant neoplasm of prostate: Secondary | ICD-10-CM

## 2017-01-17 MED ORDER — ABIRATERONE ACETATE 250 MG PO TABS: 1000.0000 mg | ORAL_TABLET | Freq: Every day | ORAL | 0 refills | Status: DC

## 2017-01-17 MED ORDER — PREDNISONE 5 MG PO TABS: 5.0000 mg | ORAL_TABLET | Freq: Every day | ORAL | 3 refills | Status: DC

## 2017-01-17 NOTE — Progress Notes (Signed)
Reason for Referral: Prostate cancer  HPI: 76 year old gentleman currently of [place] where he been residing the majority of his life. He is a pleasant gentleman with history of prostate cancer dates back to November 2015. At that time he presented with a PSA 15 and underwent a prostate biopsy at that time. He was found to have a prostate adenocarcinoma Gleason score 4+5 = 9 with a tertiary pattern of 4+3 = 7. His initial staging workup indicated advanced disease with bony metastasis in the left femur. MRI confirmed the presence of left intertrochanteric femur with multiple lesions in the pelvis. He did receive palliative radiation therapy to the area under the care of Dr. Tammi Klippel between 02/19/2016 and 03/01/2016. He was treated by Dr. Luberta Robertson initially with Lupron every 4 months and most recently every 6 months. He had an excellent PSA response initially with a nadir down to 0.13 in July 2017. His PSA was up to 0.49 in November 2017 and in February 2018 was 2.07 his testosterone was adequately castrate at that time. His most recent bone scan obtained on 10/02/2016 showed stable disease with diffuse bony metastasis which is unchanged.  Clinically, he reports no major changes in his health. He remains an excellent health and shape and continues to live independently. He continues to drive and work out regularly. He denied any back pain, shoulder pain or hip pain. He denied any urination difficulties.   He does not report any headaches, blurry vision, syncope or seizures. He is not report any fevers, chills or sweats. He does not report any cough, wheezing or hemoptysis. He does not report any chest pain, palpitation, orthopnea or leg edema. He does not report any nausea, vomiting or abdominal pain. He does not report any constipation, diarrhea or oh satiety. He does not report any hematuria or dysuria. Remaining review of systems unremarkable.   Past Medical History:  Diagnosis Date  . At risk for  sleep apnea    STOP-BANG= 4      . Elevated PSA   . Hypertension   . Nocturia   . Wears partial dentures   :  Past Surgical History:  Procedure Laterality Date  . INGUINAL HERNIA REPAIR Bilateral 1990  &  1960s  . LUMBAR SPINE SURGERY  age 45's  . PROSTATE BIOPSY N/A 09/16/2014   Procedure: BIOPSY TRANSRECTAL ULTRASONIC PROSTATE (TUBP);  Surgeon: Arvil Persons, MD;  Location: [facility];  Service: Urology;  Laterality: N/A;  :   Current Outpatient Prescriptions:  .  Cholecalciferol (VITAMIN D3) 1000 UNITS CAPS, Take 1 capsule by mouth daily., Disp: , Rfl:  .  lisinopril (PRINIVIL,ZESTRIL) 10 MG tablet, Take 1 tablet by mouth daily., Disp: , Rfl:  .  Multiple Vitamin (MULTIVITAMIN) tablet, Take 1 tablet by mouth daily., Disp: , Rfl:  .  vitamin B-12 (CYANOCOBALAMIN) 1000 MCG tablet, Take 1,000 mcg by mouth daily., Disp: , Rfl:  .  abiraterone Acetate ([name]) 250 MG tablet, Take 4 tablets (1,000 mg total) by mouth daily. Take on an empty stomach 1 hour before or 2 hours after a meal, Disp: 120 tablet, Rfl: 0 .  predniSONE (DELTASONE) 5 MG tablet, Take 1 tablet (5 mg total) by mouth daily with breakfast., Disp: 60 tablet, Rfl: 3:  No Known Allergies:  Family History  Problem Relation Age of Onset  . Cancer Neg Hx   :  Social History   Social History  . Marital status: Widowed    Spouse name: N/A  . Number  of children: N/A  . Years of education: N/A   Occupational History  . Not on file.   Social History Main Topics  . Smoking status: Never Smoker  . Smokeless tobacco: Never Used  . Alcohol use No  . Drug use: No  . Sexual activity: Not Currently   Other Topics Concern  . Not on file   Social History Narrative  . No narrative on file  :  Pertinent items are noted in HPI.  Exam: Blood pressure 138/73, pulse 100, temperature 98 F (36.7 C), temperature source Oral, height 5' 8.5" (1.74 m), weight 162 lb 3.2 oz (73.6 kg), SpO2 100 %.  ECOG  0 General appearance: alert and cooperative appeared without distress. Throat: Oral mucosa without ulcers or lesions. Neck: no adenopathy Back: negative Resp: clear to auscultation bilaterally Cardio: regular rate and rhythm, S1, S2 normal, no murmur, click, rub or gallop GI: soft, non-tender; bowel sounds normal; no masses,  no organomegaly Extremities: extremities normal, atraumatic, no cyanosis or edema Skin: Skin color, texture, turgor normal. No rashes or lesions  CBC    Component Value Date/Time   HGB 15.3 09/16/2014 0851   HCT 45.0 09/16/2014 0851     Chemistry      Component Value Date/Time   NA 141 09/16/2014 0851   K 4.9 09/16/2014 0851   CREATININE 0.80 10/24/2014 1259   No results found for: CALCIUM, ALKPHOS, AST, ALT, BILITOT     Assessment and Plan:   76 year old gentleman with the following issues:  1. Castration resistant prostate cancer with metastatic disease to the bone. His initial diagnosis was in November 2015 with a Gleason score of 4+5 = 9 and a PSA 15. He had advanced disease to the bone including his left femur.  He was treated with palliative radiation therapy initially to the femur and androgen deprivation. He is currently receiving Lupron with an excellent response initially. His PSA nadir was 0.13 and July 2017. His PSA was up to 0.49 in November 2017 and 2.07 in February 2018. His castrate level testosterone indicating castration resistant disease.  The natural course of this disease was discussed with the patient today including different treatment options. Second line hormonal therapy with Nicki Reaper, ketoconazole among others were reviewed. Provenge immunotherapy and systemic chemotherapy could also be an option. Radiopharmaceutical options such as Trudi Ida are  also an option.  After discussion today, he is willing to proceed with [name] at this time. Complications associated with this medication including fatigue, nausea, fluid retention,  electrolyte imbalance and adrenal insufficiency. The benefit would include improvement in his PSA, quality of life and overall survival. I also explained to him the need to take it with prednisone at 5 mg daily., Percussion social with this medication include weight gain, fluid retention and hypoglycemia. He willing to proceed with this therapy and written information was given to the patient today.  2. Androgen deprivation: He is to continue Lupron indefinitely.  3. Bone directed therapy: He is already on Xgeva and I agree with continuing that.  4. Follow-up: Will be in the next 4-5 weeks to follow on his progress.

## 2017-01-17 NOTE — Progress Notes (Signed)
Information regarding [name] given to patient.

## 2017-01-17 NOTE — Telephone Encounter (Signed)
Gave patient calender and AVS per 01/17/2017 los.

## 2017-01-23 ENCOUNTER — Telehealth: Payer: Self-pay | Admitting: Pharmacist

## 2017-01-23 ENCOUNTER — Encounter: Payer: Self-pay | Admitting: Medical Oncology

## 2017-01-23 DIAGNOSIS — C61 Malignant neoplasm of prostate: Secondary | ICD-10-CM

## 2017-01-23 MED ORDER — ABIRATERONE ACETATE 250 MG PO TABS: 1000.0000 mg | ORAL_TABLET | Freq: Every day | ORAL | 0 refills | Status: DC

## 2017-01-23 NOTE — Telephone Encounter (Signed)
Oral Chemotherapy Pharmacist Encounter  Received new prescription for [name] for metastatic castration-resistant prostate cancer  No recent labs in Epic to review for appropriate dosing. No evidence on hepatic or renal dysfunction on scanned labs from 10/17/15  Current medication list in Epic assessed, no DDis with [name] identified.  Prescription will be sent to the [facility] for benefits analysis.  Oral Oncology Clinic will continue to follow.  Johny Drilling, PharmD, BCPS, BCOP 01/23/2017  10:40 AM Oral Oncology Clinic [phone]

## 2017-01-24 ENCOUNTER — Telehealth: Payer: Self-pay | Admitting: Oncology

## 2017-01-24 NOTE — Telephone Encounter (Signed)
Faxed office note to [facility] °

## 2017-01-31 MED FILL — ZYTIGA 250 MG TABLET: 250 | 30 days supply | Qty: 120 | Fill #0

## 2017-01-31 NOTE — Telephone Encounter (Addendum)
Oral Chemotherapy Pharmacist Encounter  Received notification from [place] that patient has high copay for [name]. Upon further investigation, noted patient with double coverage for prescription insurance.  Prior authorization for secondary insurance ([place]) submitted on CoverMyMeds Status is approved.  Effective dates: 01/31/17-01/31/2019  I called [facility] to have them re-process prescription on both insurances, copayment $103 They will call and discuss copayment with patient and let the office know if we still need to pursue manufacturer assistance as all foundation grants for prostate cancer are currently fully allocated.  Oral Oncology Clinic will continue to follow.  Johny Drilling, PharmD, BCPS, BCOP 01/31/2017  11:07 AM Oral Oncology Clinic [phone]

## 2017-01-31 NOTE — Telephone Encounter (Signed)
Oral Chemotherapy Pharmacist Encounter  Received notification from [facility] that they were successful in obtaining a copay card for patient. [name] copayment now $10. Patient can afford. The pharmacy will contact patient when [name] ready for pick up.  Oral Oncology Clinic will continue to follow.  Johny Drilling, PharmD, BCPS, BCOP 01/31/2017  3:07 PM Oral Oncology Clinic [phone]

## 2017-02-12 ENCOUNTER — Ambulatory Visit: Payer: Self-pay | Admitting: Oncology

## 2017-02-19 ENCOUNTER — Telehealth: Payer: Self-pay | Admitting: Oncology

## 2017-02-19 ENCOUNTER — Other Ambulatory Visit (HOSPITAL_BASED_OUTPATIENT_CLINIC_OR_DEPARTMENT_OTHER): Payer: Medicare Other

## 2017-02-19 ENCOUNTER — Encounter: Payer: Self-pay | Admitting: Oncology

## 2017-02-19 ENCOUNTER — Ambulatory Visit (HOSPITAL_BASED_OUTPATIENT_CLINIC_OR_DEPARTMENT_OTHER): Payer: Medicare Other | Admitting: Oncology

## 2017-02-19 VITALS — BP 146/64 | HR 64 | Temp 97.6°F | Resp 18 | Ht 68.5 in | Wt 159.4 lb

## 2017-02-19 DIAGNOSIS — C7951 Secondary malignant neoplasm of bone: Secondary | ICD-10-CM

## 2017-02-19 DIAGNOSIS — C61 Malignant neoplasm of prostate: Secondary | ICD-10-CM | POA: Diagnosis not present

## 2017-02-19 DIAGNOSIS — E291 Testicular hypofunction: Secondary | ICD-10-CM | POA: Diagnosis not present

## 2017-02-19 LAB — COMPREHENSIVE METABOLIC PANEL
ALBUMIN: 3.9 g/dL (ref 3.5–5.0)
ALK PHOS: 57 U/L (ref 40–150)
ALT: 25 U/L (ref 0–55)
ANION GAP: 9 meq/L (ref 3–11)
AST: 30 U/L (ref 5–34)
BILIRUBIN TOTAL: 0.69 mg/dL (ref 0.20–1.20)
BUN: 14.8 mg/dL (ref 7.0–26.0)
CALCIUM: 8.8 mg/dL (ref 8.4–10.4)
CHLORIDE: 107 meq/L (ref 98–109)
CO2: 26 mEq/L (ref 22–29)
CREATININE: 0.9 mg/dL (ref 0.7–1.3)
EGFR: >90 mL/min/{1.73_m2} (ref >90)
Glucose: 101 mg/dl (ref 70–140)
Potassium: 4.6 mEq/L (ref 3.5–5.1)
Sodium: 142 mEq/L (ref 136–145)
Total Protein: 7 g/dL (ref 6.4–8.3)

## 2017-02-19 LAB — CBC WITH DIFFERENTIAL/PLATELET
BASO%: 0.8 % (ref 0.0–2.0)
BASOS ABS: 0 10*3/uL (ref 0.0–0.1)
EOS%: 0.2 % (ref 0.0–7.0)
Eosinophils Absolute: 0 10*3/uL (ref 0.0–0.5)
HEMATOCRIT: 39.5 % (ref 38.4–49.9)
HEMOGLOBIN: 13.4 g/dL (ref 13.0–17.1)
LYMPH#: 1.4 10*3/uL (ref 0.9–3.3)
LYMPH%: 44.8 % (ref 14.0–49.0)
MCH: 31.2 pg (ref 27.2–33.4)
MCHC: 34 g/dL (ref 32.0–36.0)
MCV: 91.5 fL (ref 79.3–98.0)
MONO#: 0.2 10*3/uL (ref 0.1–0.9)
MONO%: 6.6 % (ref 0.0–14.0)
NEUT#: 1.5 10*3/uL (ref 1.5–6.5)
NEUT%: 47.6 % (ref 39.0–75.0)
PLATELETS: 209 10*3/uL (ref 140–400)
RBC: 4.31 10*6/uL (ref 4.20–5.82)
RDW: 14.4 % (ref 11.0–14.6)
WBC: 3.1 10*3/uL — ABNORMAL LOW (ref 4.0–10.3)

## 2017-02-19 NOTE — Progress Notes (Signed)
Met with patient to introduce myself as Arboriculturist. Advised him of two grants he may apply for and how they work(Prostate and [facility]). Patient reported his income verbally. Patient approved for both one-time grants. Patient has a copy of the approval letter along with the Outpatient pharmacy information which he is familiar with as well as the expense sheet. Patient verbalized understanding/ Patient was given my card for any additional financial questions or concerns. Patient received a gas card today from his grant assistance.

## 2017-02-19 NOTE — Telephone Encounter (Signed)
Gave patient AVS and calender per 4/11 los . Follow up in 7 weeks.

## 2017-02-19 NOTE — Progress Notes (Signed)
Hematology and Oncology Follow Up Visit  Austin Oconnor [id number] 09-15-1941 76 y.o. 02/19/2017 10:20 AM Austin Oconnor, MDVaradarajan, [name],*   Principle Diagnosis: 76 year old gentleman with castration resistant metastatic prostate cancer with disease to the bone. His initial diagnosis was in 2015 with a Gleason score 4+5 = 9.    Prior Therapy:  He did receive palliative radiation therapy to the area under the care of Dr. Tammi Klippel between 02/19/2016 and 03/01/2016.   He was treated by Dr. Luberta Robertson initially with Lupron every 4 months and most recently every 6 months. He had an excellent PSA response initially with a nadir down to 0.13 in July 2017. His PSA was up to 0.49 in November 2017 and in February 2018 was 2.07 his testosterone was adequately castrate at that time. Bone scan obtained on 10/02/2016 showed stable disease with diffuse bony metastasis which is unchanged.  Current therapy: [name] 1000 mg daily with prednisone 5 mg daily started in March 2018.  Interim History: [name] presents today for a follow-up visit. His last visit, he started [name] and prednisone without complications. He denied any nausea, fatigue or lower extremity edema. His appetite remains excellent and maintained his weight. He also feels last few weeks minus gained some more weight. He denied any pathological fractures or bone pain. He denied any arthralgias or myalgias. He denied any complications related to prednisone. He remains active and attends to activities of daily living.  He does not report any headaches, blurry vision, syncope or seizures. He is not report any fevers, chills or sweats. He does not report any cough, wheezing or hemoptysis. He does not report any chest pain, palpitation, orthopnea or leg edema. He does not report any nausea, vomiting or abdominal pain. He does not report any constipation, diarrhea or oh satiety. He does not report any hematuria or dysuria. Remaining review  of systems unremarkable.   Medications: I have reviewed the patient's current medications.  Current Outpatient Prescriptions  Medication Sig Dispense Refill  . abiraterone Acetate ([name]) 250 MG tablet Take 4 tablets (1,000 mg total) by mouth daily. Take on an empty stomach 1 hour before or 2 hours after a meal 120 tablet 0  . Cholecalciferol (VITAMIN D3) 1000 UNITS CAPS Take 1 capsule by mouth daily.    Marland Kitchen lisinopril (PRINIVIL,ZESTRIL) 10 MG tablet Take 1 tablet by mouth daily.    . Multiple Vitamin (MULTIVITAMIN) tablet Take 1 tablet by mouth daily.    . predniSONE (DELTASONE) 5 MG tablet Take 1 tablet (5 mg total) by mouth daily with breakfast. 60 tablet 3  . vitamin B-12 (CYANOCOBALAMIN) 1000 MCG tablet Take 1,000 mcg by mouth daily.     No current facility-administered medications for this visit.      Allergies: No Known Allergies  Past Medical History, Surgical history, Social history, and Family History were reviewed and updated.   Physical Exam: Blood pressure (!) 146/64, pulse 64, temperature 97.6 F (36.4 C), temperature source Oral, resp. rate 18, height 5' 8.5" (1.74 m), weight 159 lb 6.4 oz (72.3 kg), SpO2 100 %. ECOG: 0 General appearance: alert and cooperative appeared without distress. Head: Normocephalic, without obvious abnormality Neck: no adenopathy Lymph nodes: Cervical, supraclavicular, and axillary nodes normal. Heart:regular rate and rhythm, S1, S2 normal, no murmur, click, rub or gallop Lung:chest clear, no wheezing, rales, normal symmetric air entry. Abdomin: soft, non-tender, without masses or organomegaly EXT:no erythema, induration, or nodules   Lab Results: Lab Results  Component Value Date   WBC 3.1 (  L) 02/19/2017   HGB 13.4 02/19/2017   HCT 39.5 02/19/2017   MCV 91.5 02/19/2017   PLT 209 02/19/2017     Chemistry      Component Value Date/Time   NA 141 09/16/2014 0851   K 4.9 09/16/2014 0851   CREATININE 0.80 10/24/2014 1259   No  results found for: CALCIUM, ALKPHOS, AST, ALT, BILITOT     Impression and Plan:  76 year old gentleman with the following issues:  1. Castration resistant prostate cancer with metastatic disease to the bone. His initial diagnosis was in November 2015 with a Gleason score of 4+5 = 9 and a PSA 15. He had advanced disease to the bone including his left femur.  He was treated with palliative radiation therapy initially to the femur and androgen deprivation. He is currently receiving Lupron with an excellent response initially. His PSA nadir was 0.13 and July 2017. His PSA was up to 0.49 in November 2017 and 2.07 in February 2018. His castrate level testosterone indicating castration resistant disease.  He is currently on [name] and prednisone started in March 2018. After 2 weeks of therapy continues to tolerate this medication without complications. His PSA is currently pending and we'll continue to check periodically. The plan is to continue at this time as long as his PSA response without any complications.  2. Androgen deprivation: He is to continue Lupron indefinitely. Currently receiving it at [facility].  3. Bone directed therapy: He is already on Xgeva and I agree with continuing that.  4. Follow-up: Will be in the next 6 weeks to follow on his progress.   [facility], MD 4/11/201810:20 AM

## 2017-02-20 LAB — PSA: Prostate Specific Ag, Serum: 0.3 ng/mL (ref 0.0–4.0)

## 2017-03-04 ENCOUNTER — Other Ambulatory Visit: Payer: Self-pay | Admitting: *Deleted

## 2017-03-04 DIAGNOSIS — C61 Malignant neoplasm of prostate: Secondary | ICD-10-CM

## 2017-03-04 MED ORDER — ABIRATERONE ACETATE 250 MG PO TABS: 1000.0000 mg | ORAL_TABLET | Freq: Every day | ORAL | 0 refills | Status: DC

## 2017-03-04 MED FILL — ZYTIGA 250 MG TABLET: 250 | 30 days supply | Qty: 120 | Fill #0

## 2017-03-27 ENCOUNTER — Other Ambulatory Visit: Payer: Self-pay | Admitting: Oncology

## 2017-03-27 DIAGNOSIS — C61 Malignant neoplasm of prostate: Secondary | ICD-10-CM

## 2017-03-27 MED FILL — ZYTIGA 250 MG TABLET: 250 | 30 days supply | Qty: 120 | Fill #0

## 2017-04-09 ENCOUNTER — Telehealth: Payer: Self-pay | Admitting: Oncology

## 2017-04-09 ENCOUNTER — Other Ambulatory Visit (HOSPITAL_BASED_OUTPATIENT_CLINIC_OR_DEPARTMENT_OTHER): Payer: Medicare Other

## 2017-04-09 ENCOUNTER — Ambulatory Visit (HOSPITAL_BASED_OUTPATIENT_CLINIC_OR_DEPARTMENT_OTHER): Payer: Medicare Other | Admitting: Oncology

## 2017-04-09 VITALS — BP 129/64 | HR 62 | Temp 98.5°F | Resp 20 | Ht 68.5 in | Wt 160.3 lb

## 2017-04-09 DIAGNOSIS — C7951 Secondary malignant neoplasm of bone: Secondary | ICD-10-CM | POA: Diagnosis not present

## 2017-04-09 DIAGNOSIS — C61 Malignant neoplasm of prostate: Secondary | ICD-10-CM

## 2017-04-09 DIAGNOSIS — E291 Testicular hypofunction: Secondary | ICD-10-CM

## 2017-04-09 LAB — COMPREHENSIVE METABOLIC PANEL
ALT: 20 U/L (ref 0–55)
ANION GAP: 8 meq/L (ref 3–11)
AST: 23 U/L (ref 5–34)
Albumin: 3.8 g/dL (ref 3.5–5.0)
Alkaline Phosphatase: 56 U/L (ref 40–150)
BUN: 16.4 mg/dL (ref 7.0–26.0)
CO2: 26 meq/L (ref 22–29)
CREATININE: 1 mg/dL (ref 0.7–1.3)
Calcium: 8.7 mg/dL (ref 8.4–10.4)
Chloride: 105 mEq/L (ref 98–109)
EGFR: 89 mL/min/{1.73_m2} — ABNORMAL LOW (ref >90)
Glucose: 75 mg/dl (ref 70–140)
Potassium: 3.9 mEq/L (ref 3.5–5.1)
Sodium: 140 mEq/L (ref 136–145)
Total Bilirubin: 0.66 mg/dL (ref 0.20–1.20)
Total Protein: 6.5 g/dL (ref 6.4–8.3)

## 2017-04-09 LAB — CBC WITH DIFFERENTIAL/PLATELET
BASO%: 0.7 % (ref 0.0–2.0)
Basophils Absolute: 0 10*3/uL (ref 0.0–0.1)
EOS%: 0.3 % (ref 0.0–7.0)
Eosinophils Absolute: 0 10*3/uL (ref 0.0–0.5)
HCT: 39.6 % (ref 38.4–49.9)
HGB: 13.2 g/dL (ref 13.0–17.1)
LYMPH%: 51.8 % — ABNORMAL HIGH (ref 14.0–49.0)
MCH: 30.6 pg (ref 27.2–33.4)
MCHC: 33.4 g/dL (ref 32.0–36.0)
MCV: 91.7 fL (ref 79.3–98.0)
MONO#: 0.4 10*3/uL (ref 0.1–0.9)
MONO%: 10.5 % (ref 0.0–14.0)
NEUT#: 1.3 10*3/uL — ABNORMAL LOW (ref 1.5–6.5)
NEUT%: 36.7 % — AB (ref 39.0–75.0)
PLATELETS: 181 10*3/uL (ref 140–400)
RBC: 4.32 10*6/uL (ref 4.20–5.82)
RDW: 14.1 % (ref 11.0–14.6)
WBC: 3.6 10*3/uL — AB (ref 4.0–10.3)
lymph#: 1.8 10*3/uL (ref 0.9–3.3)

## 2017-04-09 NOTE — Progress Notes (Signed)
Hematology and Oncology Follow Up Visit  Austin Oconnor [id number] 1940/12/31 76 y.o. 04/09/2017 8:45 AM Austin Oconnor, MDVaradarajan, Oconnor,*   Principle Diagnosis: 76 year old gentleman with castration resistant metastatic prostate cancer with disease to the bone. His initial diagnosis was in 2015 with a Gleason score 4+5 = 9.    Prior Therapy:  He did receive palliative radiation therapy to the area under the care of Dr. Tammi Klippel between 02/19/2016 and 03/01/2016.   He was treated by Dr. Luberta Robertson initially with Lupron every 4 months and most recently every 6 months. He had an excellent PSA response initially with a nadir down to 0.13 in July 2017. His PSA was up to 0.49 in November 2017 and in February 2018 was 2.07 his testosterone was adequately castrate at that time. Bone scan obtained on 10/02/2016 showed stable disease with diffuse bony metastasis which is unchanged.  Current therapy: [name] 1000 mg daily with prednisone 5 mg daily started in March 2018.  Interim History: Austin Oconnor presents today for a follow-up visit. Since the last visit, he reports no changes in his health. He continues to take [name] without complications.. He denied any nausea, fatigue or lower extremity edema. His appetite remains excellent and maintained his weight. He denied any pathological fractures or bone pain. He denied any arthralgias or myalgias. He denied any complications related to prednisone. He does report periodic headaches related to sinus allergies. He denied any recent hospitalizations or illnesses.  He does not report any headaches, blurry vision, syncope or seizures. He is not report any fevers, chills or sweats. He does not report any cough, wheezing or hemoptysis. He does not report any chest pain, palpitation, orthopnea or leg edema. He does not report any nausea, vomiting or abdominal pain. He does not report any constipation, diarrhea or oh satiety. He does not report any  hematuria or dysuria. Remaining review of systems unremarkable.   Medications: I have reviewed the patient's current medications.  Current Outpatient Prescriptions  Medication Sig Dispense Refill  . Cholecalciferol (VITAMIN D3) 1000 UNITS CAPS Take 1 capsule by mouth daily.    Marland Kitchen lisinopril (PRINIVIL,ZESTRIL) 10 MG tablet Take 1 tablet by mouth daily.    . Multiple Vitamin (MULTIVITAMIN) tablet Take 1 tablet by mouth daily.    . predniSONE (DELTASONE) 5 MG tablet Take 1 tablet (5 mg total) by mouth daily with breakfast. 60 tablet 3  . vitamin B-12 (CYANOCOBALAMIN) 1000 MCG tablet Take 1,000 mcg by mouth daily.    Marland Kitchen [name] 250 MG tablet TAKE 4 TABLETS (1,000 MG TOTAL) BY MOUTH DAILY. TAKE ON AN EMPTY STOMACH 1 HOUR BEFORE OR 2 HOURS AFTER A MEAL 120 tablet 0   No current facility-administered medications for this visit.      Allergies: No Known Allergies  Past Medical History, Surgical history, Social history, and Family History were reviewed and updated.   Physical Exam: Blood pressure 129/64, pulse 62, temperature 98.5 F (36.9 C), temperature source Oral, resp. rate 20, height 5' 8.5" (1.74 m), weight 160 lb 4.8 oz (72.7 kg), SpO2 100 %. ECOG: 0 General appearance: Well-appearing gentleman without distress. Head: Normocephalic, without obvious abnormality without oral ulcers or lesions. Neck: no adenopathy Lymph nodes: Cervical, supraclavicular, and axillary nodes normal. Heart:regular rate and rhythm, S1, S2 normal, no murmur, click, rub or gallop Lung:chest clear, no wheezing, rales, normal symmetric air entry. Abdomin: soft, non-tender, without masses or organomegaly no shifting dullness or ascites. EXT:no erythema, induration, or nodules   Lab Results: Lab Results  Component Value Date   WBC 3.6 (L) 04/09/2017   HGB 13.2 04/09/2017   HCT 39.6 04/09/2017   MCV 91.7 04/09/2017   PLT 181 04/09/2017     Chemistry      Component Value Date/Time   NA 142 02/19/2017 0953    K 4.6 02/19/2017 0953   CO2 26 02/19/2017 0953   BUN 14.8 02/19/2017 0953   CREATININE 0.9 02/19/2017 0953      Component Value Date/Time   CALCIUM 8.8 02/19/2017 0953   ALKPHOS 57 02/19/2017 0953   AST 30 02/19/2017 0953   ALT 25 02/19/2017 0953   BILITOT 0.69 02/19/2017 0953     Results for Austin Oconnor, Austin Oconnor (MRN [id number]) as of 04/09/2017 08:47  Ref. Range 02/19/2017 09:53  PSA Latest Ref Range: 0.0 - 4.0 ng/mL 0.3    Impression and Plan:  76 year old gentleman with the following issues:  1. Castration resistant prostate cancer with metastatic disease to the bone. His initial diagnosis was in November 2015 with a Gleason score of 4+5 = 9 and a PSA 15. He had advanced disease to the bone including his left femur.  He was treated with palliative radiation therapy initially to the femur and androgen deprivation. He is currently receiving Lupron with an excellent response initially. His PSA nadir was 0.13 and July 2017. His PSA was up to 0.49 in November 2017 and 2.07 in February 2018. His castrate level testosterone indicating castration resistant disease.  He is currently on [name] and prednisone started in March 2018.   His PSA on 02/19/2017 was 0.3 indicating excellent response to therapy. Risks and benefits of continuing this medication were reviewed today and is agreeable to continue. He has no difficulties obtaining it or take it at this time.  2. Androgen deprivation: He is to continue Lupron indefinitely. Currently receiving it at [facility].  3. Bone directed therapy: He is already on Xgeva and I have recommended continuing it for the time being.  4. Follow-up: Will be in the next 8 weeks to follow on his progress.   [facility], MD 5/30/20188:45 AM

## 2017-04-09 NOTE — Telephone Encounter (Signed)
Appointments scheduled per 04/09/17 los. Patient was given a copy of the AVS report and appointment schedule, per 04/09/17 los. °

## 2017-04-10 ENCOUNTER — Telehealth: Payer: Self-pay | Admitting: *Deleted

## 2017-04-10 LAB — PSA

## 2017-04-10 NOTE — Telephone Encounter (Signed)
-----   Message from Wyatt Portela, MD sent at 04/10/2017  8:15 AM EDT ----- Please let him know his PSA is very low.

## 2017-04-10 NOTE — Telephone Encounter (Signed)
As noted below by Dr. Shadad, I informed patient of his PSA level. Patient verbalized understanding. 

## 2017-04-25 ENCOUNTER — Other Ambulatory Visit: Payer: Self-pay | Admitting: *Deleted

## 2017-04-25 DIAGNOSIS — C61 Malignant neoplasm of prostate: Secondary | ICD-10-CM

## 2017-04-25 MED ORDER — ABIRATERONE ACETATE 250 MG PO TABS: 1000.0000 mg | ORAL_TABLET | Freq: Every day | ORAL | 0 refills | Status: DC

## 2017-04-28 MED FILL — ZYTIGA 250 MG TABLET: 250 | 30 days supply | Qty: 120 | Fill #0

## 2017-04-30 ENCOUNTER — Telehealth (HOSPITAL_COMMUNITY): Payer: Self-pay | Admitting: Pharmacy Technician

## 2017-05-20 ENCOUNTER — Other Ambulatory Visit: Payer: Self-pay | Admitting: Oncology

## 2017-05-20 DIAGNOSIS — C61 Malignant neoplasm of prostate: Secondary | ICD-10-CM

## 2017-05-22 MED FILL — ZYTIGA 250 MG TABLET: 250 | 30 days supply | Qty: 120 | Fill #0

## 2017-06-10 ENCOUNTER — Ambulatory Visit (HOSPITAL_BASED_OUTPATIENT_CLINIC_OR_DEPARTMENT_OTHER): Payer: Medicare Other | Admitting: Oncology

## 2017-06-10 ENCOUNTER — Other Ambulatory Visit (HOSPITAL_BASED_OUTPATIENT_CLINIC_OR_DEPARTMENT_OTHER): Payer: Medicare Other

## 2017-06-10 VITALS — BP 140/74 | HR 77 | Temp 98.5°F | Resp 17 | Ht 68.5 in | Wt 157.4 lb

## 2017-06-10 DIAGNOSIS — C61 Malignant neoplasm of prostate: Secondary | ICD-10-CM | POA: Diagnosis not present

## 2017-06-10 DIAGNOSIS — E291 Testicular hypofunction: Secondary | ICD-10-CM | POA: Diagnosis not present

## 2017-06-10 DIAGNOSIS — C7951 Secondary malignant neoplasm of bone: Secondary | ICD-10-CM

## 2017-06-10 LAB — CBC WITH DIFFERENTIAL/PLATELET
BASO%: 0.9 % (ref 0.0–2.0)
Basophils Absolute: 0 10*3/uL (ref 0.0–0.1)
EOS%: 0.3 % (ref 0.0–7.0)
Eosinophils Absolute: 0 10*3/uL (ref 0.0–0.5)
HCT: 40 % (ref 38.4–49.9)
HGB: 13.5 g/dL (ref 13.0–17.1)
LYMPH%: 51.1 % — AB (ref 14.0–49.0)
MCH: 31.3 pg (ref 27.2–33.4)
MCHC: 33.8 g/dL (ref 32.0–36.0)
MCV: 92.7 fL (ref 79.3–98.0)
MONO#: 0.3 10*3/uL (ref 0.1–0.9)
MONO%: 9.8 % (ref 0.0–14.0)
NEUT#: 1.3 10*3/uL — ABNORMAL LOW (ref 1.5–6.5)
NEUT%: 37.9 % — AB (ref 39.0–75.0)
Platelets: 196 10*3/uL (ref 140–400)
RBC: 4.32 10*6/uL (ref 4.20–5.82)
RDW: 14.2 % (ref 11.0–14.6)
WBC: 3.5 10*3/uL — ABNORMAL LOW (ref 4.0–10.3)
lymph#: 1.8 10*3/uL (ref 0.9–3.3)

## 2017-06-10 LAB — COMPREHENSIVE METABOLIC PANEL
ALT: 17 U/L (ref 0–55)
ANION GAP: 8 meq/L (ref 3–11)
AST: 21 U/L (ref 5–34)
Albumin: 3.7 g/dL (ref 3.5–5.0)
Alkaline Phosphatase: 51 U/L (ref 40–150)
BUN: 11.6 mg/dL (ref 7.0–26.0)
CHLORIDE: 107 meq/L (ref 98–109)
CO2: 26 meq/L (ref 22–29)
Calcium: 8.5 mg/dL (ref 8.4–10.4)
Creatinine: 0.9 mg/dL (ref 0.7–1.3)
Glucose: 76 mg/dl (ref 70–140)
POTASSIUM: 4.3 meq/L (ref 3.5–5.1)
Sodium: 141 mEq/L (ref 136–145)
Total Bilirubin: 0.55 mg/dL (ref 0.20–1.20)
Total Protein: 6.4 g/dL (ref 6.4–8.3)

## 2017-06-10 NOTE — Progress Notes (Signed)
Hematology and Oncology Follow Up Visit  Austin Oconnor [id number] June 30, 1941 76 y.o. 06/10/2017 8:16 AM [name], MDVaradarajan, [name],*   Principle Diagnosis: 76 year old gentleman with castration-resistant metastatic prostate cancer with disease to the bone. His initial diagnosis was in 2015 with a Gleason score 4+5 = 9.    Prior Therapy:  He did receive palliative radiation therapy to the area under the care of Dr. Tammi Klippel between 02/19/2016 and 03/01/2016.   He was treated by Dr. Luberta Robertson initially with Lupron every 4 months and most recently every 6 months. He had an excellent PSA response initially with a nadir down to 0.13 in July 2017. His PSA was up to 0.49 in November 2017 and in February 2018 was 2.07 his testosterone was adequately castrate at that time. Bone scan obtained on 10/02/2016 showed stable disease with diffuse bony metastasis which is unchanged.  Current therapy: [name] 1000 mg daily with prednisone 5 mg daily started in March 2018.  Interim History: Austin Oconnor presents today for a follow-up visit. Since the last visit, he reports doing well. He continues to exercise regularly and performs activities of daily living. He reports a pulled muscle in his back while he was working out but no other pain or discomfort. He continues to take [name] without complications. He denied any nausea, fatigue or lower extremity edema. He denied any pathological fractures or hospitalizations. His appetite remains excellent and maintained his weight. He denied any arthralgias or myalgias. He denied any complications related to prednisone. He had any anxiety or insomnia. His quality of life and performance status remains excellent.  He does not report any headaches, blurry vision, syncope or seizures. He is not report any fevers, chills or sweats. He does not report any cough, wheezing or hemoptysis. He does not report any chest pain, palpitation, orthopnea or leg edema. He  does not report any nausea, vomiting or abdominal pain. He does not report any constipation, diarrhea or oh satiety. He does not report any hematuria or dysuria. Remaining review of systems unremarkable.   Medications: I have reviewed the patient's current medications.  Current Outpatient Prescriptions  Medication Sig Dispense Refill  . Cholecalciferol (VITAMIN D3) 1000 UNITS CAPS Take 1 capsule by mouth daily.    Marland Kitchen lisinopril (PRINIVIL,ZESTRIL) 10 MG tablet Take 1 tablet by mouth daily.    . Multiple Vitamin (MULTIVITAMIN) tablet Take 1 tablet by mouth daily.    . predniSONE (DELTASONE) 5 MG tablet Take 1 tablet (5 mg total) by mouth daily with breakfast. 60 tablet 3  . vitamin B-12 (CYANOCOBALAMIN) 1000 MCG tablet Take 1,000 mcg by mouth daily.    Marland Kitchen [name] 250 MG tablet TAKE 4 TABLETS (1,000 MG TOTAL) BY MOUTH DAILY. TAKE ON AN EMPTY STOMACH 1 HOUR BEFORE OR 2 HOURS AFTER A MEAL 120 tablet 0   No current facility-administered medications for this visit.      Allergies: No Known Allergies  Past Medical History, Surgical history, Social history, and Family History were reviewed and updated.   Physical Exam: Blood pressure 140/74, pulse 77, temperature 98.5 F (36.9 C), temperature source Oral, resp. rate 17, height 5' 8.5" (1.74 m), weight 157 lb 6.4 oz (71.4 kg), SpO2 99 %. ECOG: 0 General appearance: Alert, awake gentleman without distress. Head: Normocephalic, without obvious abnormality. No ulcers or thrush. Neck: no adenopathy no masses or lesions. Lymph nodes: Cervical, supraclavicular, and axillary nodes normal. Heart:regular rate and rhythm, S1, S2 normal, no murmur, click, rub or gallop Lung:chest clear, no wheezing,  rales, normal symmetric air entry. Abdomin: soft, non-tender, without masses or organomegaly no rebound or guarding. EXT:no erythema, induration, or nodules   Lab Results: Lab Results  Component Value Date   WBC 3.5 (L) 06/10/2017   HGB 13.5 06/10/2017    HCT 40.0 06/10/2017   MCV 92.7 06/10/2017   PLT 196 06/10/2017     Chemistry      Component Value Date/Time   NA 140 04/09/2017 0816   K 3.9 04/09/2017 0816   CO2 26 04/09/2017 0816   BUN 16.4 04/09/2017 0816   CREATININE 1.0 04/09/2017 0816      Component Value Date/Time   CALCIUM 8.7 04/09/2017 0816   ALKPHOS 56 04/09/2017 0816   AST 23 04/09/2017 0816   ALT 20 04/09/2017 0816   BILITOT 0.66 04/09/2017 0816     Results for Austin Oconnor, Austin Oconnor (MRN [id number]) as of 06/10/2017 07:39  Ref. Range 02/19/2017 09:53 04/09/2017 08:16  Prostate Specific Ag, Serum Latest Ref Range: 0.0 - 4.0 ng/mL 0.3 <0.1     Impression and Plan:  76 year old gentleman with the following issues:  1. Castration resistant prostate cancer with metastatic disease to the bone. His initial diagnosis was in November 2015 with a Gleason score of 4+5 = 9 and a PSA 15. He had advanced disease to the bone including his left femur.  He was treated with palliative radiation therapy initially to the femur and androgen deprivation. He is currently receiving Lupron with an excellent response initially. His PSA nadir was 0.13 and July 2017. His PSA was up to 0.49 in November 2017 and 2.07 in February 2018. His castrate level testosterone indicating castration resistant disease.  He is currently on [name] and prednisone started in March 2018.   His PSA shows excellent response and currently undetectable based on laboratory data in May 2018. Risks and benefits of continuing this medication were reviewed again and he is agreeable to continue. He continues to enjoy excellent quality of life and performance status and has no objections continuing this medication.  2. Androgen deprivation: He is to continue Lupron indefinitely. Currently receiving it at [facility].  3. Bone directed therapy: He is already on Xgeva and I have recommended continuing it for the time being.  4. Follow-up: Will be in 3  months.   [facility], MD 7/31/20188:16 AM

## 2017-06-11 LAB — PSA: Prostate Specific Ag, Serum: <0.1 ng/mL (ref 0.0–4.0)

## 2017-06-13 ENCOUNTER — Other Ambulatory Visit: Payer: Self-pay | Admitting: Oncology

## 2017-06-13 DIAGNOSIS — C61 Malignant neoplasm of prostate: Secondary | ICD-10-CM

## 2017-06-16 MED FILL — ZYTIGA 250 MG TABLET: 250 | 30 days supply | Qty: 120 | Fill #0

## 2017-07-08 ENCOUNTER — Other Ambulatory Visit: Payer: Self-pay | Admitting: Oncology

## 2017-07-08 DIAGNOSIS — C61 Malignant neoplasm of prostate: Secondary | ICD-10-CM

## 2017-07-10 ENCOUNTER — Encounter: Payer: Self-pay | Admitting: *Deleted

## 2017-07-10 MED FILL — ZYTIGA 250 MG TABLET: 250 | 30 days supply | Qty: 120 | Fill #0

## 2017-08-20 ENCOUNTER — Other Ambulatory Visit: Payer: Self-pay | Admitting: Oncology

## 2017-08-20 DIAGNOSIS — C61 Malignant neoplasm of prostate: Secondary | ICD-10-CM

## 2017-08-20 MED FILL — ZYTIGA 250 MG TABLET: 250 | 30 days supply | Qty: 120 | Fill #0

## 2017-08-25 ENCOUNTER — Telehealth: Payer: Self-pay | Admitting: Oncology

## 2017-08-25 NOTE — Telephone Encounter (Signed)
08/24/17 prescription refill scanned in media

## 2017-08-28 ENCOUNTER — Other Ambulatory Visit: Payer: Self-pay | Admitting: Oncology

## 2017-08-28 DIAGNOSIS — C61 Malignant neoplasm of prostate: Secondary | ICD-10-CM

## 2017-09-16 ENCOUNTER — Other Ambulatory Visit (HOSPITAL_BASED_OUTPATIENT_CLINIC_OR_DEPARTMENT_OTHER): Payer: Medicare Other

## 2017-09-16 ENCOUNTER — Telehealth: Payer: Self-pay | Admitting: Oncology

## 2017-09-16 ENCOUNTER — Ambulatory Visit (HOSPITAL_BASED_OUTPATIENT_CLINIC_OR_DEPARTMENT_OTHER): Payer: Medicare Other | Admitting: Oncology

## 2017-09-16 VITALS — BP 157/82 | HR 77 | Temp 99.2°F | Resp 20 | Ht 68.5 in | Wt 163.1 lb

## 2017-09-16 DIAGNOSIS — C7951 Secondary malignant neoplasm of bone: Secondary | ICD-10-CM | POA: Diagnosis not present

## 2017-09-16 DIAGNOSIS — E291 Testicular hypofunction: Secondary | ICD-10-CM | POA: Diagnosis not present

## 2017-09-16 DIAGNOSIS — C61 Malignant neoplasm of prostate: Secondary | ICD-10-CM

## 2017-09-16 LAB — CBC WITH DIFFERENTIAL/PLATELET
BASO%: 0.3 % (ref 0.0–2.0)
BASOS ABS: 0 10*3/uL (ref 0.0–0.1)
EOS ABS: 0 10*3/uL (ref 0.0–0.5)
EOS%: 0.7 % (ref 0.0–7.0)
HCT: 39.1 % (ref 38.4–49.9)
HGB: 13.2 g/dL (ref 13.0–17.1)
LYMPH%: 51.8 % — AB (ref 14.0–49.0)
MCH: 30.8 pg (ref 27.2–33.4)
MCHC: 33.8 g/dL (ref 32.0–36.0)
MCV: 91.4 fL (ref 79.3–98.0)
MONO#: 0.4 10*3/uL (ref 0.1–0.9)
MONO%: 12.2 % (ref 0.0–14.0)
NEUT#: 1.1 10*3/uL — ABNORMAL LOW (ref 1.5–6.5)
NEUT%: 35 % — AB (ref 39.0–75.0)
Platelets: 189 10*3/uL (ref 140–400)
RBC: 4.28 10*6/uL (ref 4.20–5.82)
RDW: 13.5 % (ref 11.0–14.6)
WBC: 3 10*3/uL — ABNORMAL LOW (ref 4.0–10.3)
lymph#: 1.6 10*3/uL (ref 0.9–3.3)

## 2017-09-16 LAB — COMPREHENSIVE METABOLIC PANEL
ALT: 19 U/L (ref 0–55)
AST: 26 U/L (ref 5–34)
Albumin: 3.8 g/dL (ref 3.5–5.0)
Alkaline Phosphatase: 53 U/L (ref 40–150)
Anion Gap: 7 mEq/L (ref 3–11)
BUN: 9.1 mg/dL (ref 7.0–26.0)
CHLORIDE: 107 meq/L (ref 98–109)
CO2: 29 meq/L (ref 22–29)
CREATININE: 0.8 mg/dL (ref 0.7–1.3)
Calcium: 8.4 mg/dL (ref 8.4–10.4)
EGFR: >60 mL/min/{1.73_m2} (ref >60)
GLUCOSE: 71 mg/dL (ref 70–140)
Potassium: 3.4 mEq/L — ABNORMAL LOW (ref 3.5–5.1)
Sodium: 143 mEq/L (ref 136–145)
TOTAL PROTEIN: 6.7 g/dL (ref 6.4–8.3)
Total Bilirubin: 0.64 mg/dL (ref 0.20–1.20)

## 2017-09-16 NOTE — Telephone Encounter (Signed)
Gave avs and calendar for February 2019 °

## 2017-09-16 NOTE — Progress Notes (Signed)
Hematology and Oncology Follow Up Visit  Austin Oconnor [id number] November 23, 1940 76 y.o. 09/16/2017 8:33 AM Austin Oconnor, MDVaradarajan, Austin Oconnor,*   Principle Diagnosis: 76 year old gentleman with castration-resistant metastatic prostate cancer with disease to the bone. His initial diagnosis was in 2015 with a Gleason score 4+5 = 9.    Prior Therapy:  He did receive palliative radiation therapy to the area under the care of Dr. Tammi Klippel between 02/19/2016 and 03/01/2016.   He was treated by Dr. Luberta Robertson initially with Lupron every 4 months and most recently every 6 months. He had an excellent PSA response initially with a nadir down to 0.13 in July 2017. His PSA was up to 0.49 in November 2017 and in February 2018 was 2.07 his testosterone was adequately castrate at that time. Bone scan obtained on 10/02/2016 showed stable disease with diffuse bony metastasis which is unchanged.  Current therapy: [name] 1000 mg daily with prednisone 5 mg daily started in March 2018.  Interim History: Mr. Szczesniak presents today for a follow-up visit. Since the last visit, he reports doing well. He reports no changes in his health. He continues to take [name] without complications. He denied any nausea, fatigue or lower extremity edema. He denied any pathological fractures or hospitalizations. His appetite remains excellent performance status remained at baseline. He denied any arthralgias or myalgias. He denied any complications related to prednisone.  He continues to exercise regularly without any decline in ability to do so.  He denies any complications or issues obtaining this medication.  He does not report any headaches, blurry vision, syncope or seizures. He is not report any fevers, chills or sweats. He does not report any cough, wheezing or hemoptysis. He does not report any chest pain, palpitation, orthopnea or leg edema. He does not report any nausea, vomiting or abdominal pain. He does not report  any constipation, diarrhea or oh satiety. He does not report any hematuria or dysuria. Remaining review of systems unremarkable.   Medications: I have reviewed the patient's current medications.  Current Outpatient Medications  Medication Sig Dispense Refill  . Cholecalciferol (VITAMIN D3) 1000 UNITS CAPS Take 1 capsule by mouth daily.    Marland Kitchen lisinopril (PRINIVIL,ZESTRIL) 10 MG tablet Take 1 tablet by mouth daily.    . Multiple Vitamin (MULTIVITAMIN) tablet Take 1 tablet by mouth daily.    . predniSONE (DELTASONE) 5 MG tablet TAKE 1 TABLET (5 MG TOTAL) BY MOUTH DAILY WITH BREAKFAST. 60 tablet 3  . vitamin B-12 (CYANOCOBALAMIN) 1000 MCG tablet Take 1,000 mcg by mouth daily.    Marland Kitchen [name] 250 MG tablet TAKE 4 TABLETS (1,000 MG TOTAL) BY MOUTH DAILY. TAKE ON AN EMPTY STOMACH 1 HOUR BEFORE OR 2 HOURS AFTER A MEAL 120 tablet 0   No current facility-administered medications for this visit.      Allergies: No Known Allergies  Past Medical History, Surgical history, Social history, and Family History were reviewed and updated.   Physical Exam: Blood pressure (!) 157/82, pulse 77, temperature 99.2 F (37.3 C), temperature source Oral, resp. rate 20, height 5' 8.5" (1.74 m), weight 163 lb 1.6 oz (74 kg), SpO2 100 %. ECOG: 0 General appearance: Well-appearing gentleman without distress. Head: Normocephalic, without obvious abnormality. No no oral ulcers or thrush. Neck: no adenopathy no masses or lesions. Lymph nodes: Cervical, supraclavicular, and axillary nodes normal. Heart:regular rate and rhythm, S1, S2 normal, no murmur, click, rub or gallop Lung:chest clear, no wheezing, rales, normal symmetric air entry. Abdomin: soft, non-tender, without masses or  organomegaly no shifting dullness or ascites. EXT:no erythema, induration, or nodules   Lab Results: Lab Results  Component Value Date   WBC 3.0 (L) 09/16/2017   HGB 13.2 09/16/2017   HCT 39.1 09/16/2017   MCV 91.4 09/16/2017   PLT 189  09/16/2017     Chemistry      Component Value Date/Time   NA 141 06/10/2017 0734   K 4.3 06/10/2017 0734   CO2 26 06/10/2017 0734   BUN 11.6 06/10/2017 0734   CREATININE 0.9 06/10/2017 0734      Component Value Date/Time   CALCIUM 8.5 06/10/2017 0734   ALKPHOS 51 06/10/2017 0734   AST 21 06/10/2017 0734   ALT 17 06/10/2017 0734   BILITOT 0.55 06/10/2017 0734     Results for GRIFFITH, SANTILLI (MRN [id number]) as of 09/16/2017 08:25  Ref. Range 02/19/2017 09:53 04/09/2017 08:16 06/10/2017 07:34  Prostate Specific Ag, Serum Latest Ref Range: 0.0 - 4.0 ng/mL 0.3 <0.1 <0.1      Impression and Plan:  76 year old gentleman with the following issues:  1. Castration resistant prostate cancer with metastatic disease to the bone. His initial diagnosis was in November 2015 with a Gleason score of 4+5 = 9 and a PSA 15. He had advanced disease to the bone including his left femur.  He was treated with palliative radiation therapy initially to the femur and androgen deprivation. His PSA nadir was 0.13 and July 2017. His PSA was up to 0.49 in November 2017 and 2.07 in February 2018. His castrate level testosterone indicating castration resistant disease.  He is currently on [name] and prednisone started in March 2018.   He continues to tolerate this medication well with excellent response to PSA.  His PSA continues to be undetectable.  Risks and benefits of continuing this medication was reviewed today and is agreeable to continue.  2. Androgen deprivation: He is to continue Lupron indefinitely. Currently receiving it at [facility].  3. Bone directed therapy: He is already on Xgeva and I have recommended continuing it for the time being.  4. Follow-up: Will be in 3 months.   Zola Button, MD 11/6/20188:33 AM

## 2017-09-17 ENCOUNTER — Other Ambulatory Visit: Payer: Self-pay | Admitting: Oncology

## 2017-09-17 ENCOUNTER — Telehealth: Payer: Self-pay | Admitting: *Deleted

## 2017-09-17 DIAGNOSIS — C61 Malignant neoplasm of prostate: Secondary | ICD-10-CM

## 2017-09-17 LAB — PSA

## 2017-09-17 MED FILL — ZYTIGA 250 MG TABLET: 250 | 30 days supply | Qty: 120 | Fill #0

## 2017-09-17 NOTE — Telephone Encounter (Signed)
Spoke with patient, gave results of last PSA 

## 2017-09-17 NOTE — Telephone Encounter (Signed)
-----   Message from Wyatt Portela, MD sent at 09/17/2017  8:39 AM EST ----- Please let him know his PSA is down

## 2017-09-24 ENCOUNTER — Other Ambulatory Visit: Payer: Self-pay | Admitting: Oncology

## 2017-09-24 DIAGNOSIS — C61 Malignant neoplasm of prostate: Secondary | ICD-10-CM

## 2017-10-10 ENCOUNTER — Other Ambulatory Visit: Payer: Self-pay | Admitting: Oncology

## 2017-10-10 DIAGNOSIS — C61 Malignant neoplasm of prostate: Secondary | ICD-10-CM

## 2017-10-20 MED FILL — ABIRATERONE ACETATE 250 MG: 250 | 30 days supply | Qty: 120 | Fill #0

## 2017-11-13 ENCOUNTER — Other Ambulatory Visit: Payer: Self-pay | Admitting: Oncology

## 2017-11-13 DIAGNOSIS — C61 Malignant neoplasm of prostate: Secondary | ICD-10-CM

## 2017-11-27 ENCOUNTER — Telehealth: Payer: Self-pay | Admitting: Pharmacy Technician

## 2017-11-27 NOTE — Telephone Encounter (Signed)
Oral Oncology [place] Advocate Encounter  Met Austin in [facility] lobby to complete application for [name] and [name] in an effort to reduce Austin's out of pocket expense for [name] to $0.    Austin previously had copay assistance.  The remainder of those funds to now fully cover his January 2019 copay.  The remaining balance after copay assistance funds are exhausted is in excess of $600.  There are no grant funds available at this time.    Austin did state that he would likely have a break in therapy while waiting for his application to process.    [name] Application will be faxed to [phone].   [name] [place] assistance phone number for follow up is [phone].   This encounter will be updated until final determination.  Austin Oconnor. Austin Oconnor, Austin Oconnor Austin Oconnor [phone] 11/27/2017 11:51 AM

## 2017-12-09 ENCOUNTER — Telehealth: Payer: Self-pay | Admitting: Pharmacy Technician

## 2017-12-09 MED FILL — ZYTIGA 250 MG TABLET: 250 | 30 days supply | Qty: 120 | Fill #0

## 2017-12-09 NOTE — Telephone Encounter (Signed)
Oral Oncology Patient Advocate Encounter  Was successful in securing patient an $ 7500 grant from Patient [place] [facility]) to provide copayment coverage for his [name].  This will keep the out of pocket expense at $0.    I have spoken with the patient.    The billing information is as follows and has been shared with [place].   Member ID: [id number] Group ID: [id number] RxBin: [id number] Dates of Eligibility: 09/10/2017 through 12/08/2018  Austin Oconnor. Melynda Keller, [place] Patient [place] [phone] 12/09/2017 3:04 PM

## 2017-12-11 ENCOUNTER — Other Ambulatory Visit: Payer: Self-pay | Admitting: Pharmacist

## 2017-12-15 ENCOUNTER — Telehealth: Payer: Self-pay | Admitting: Medical Oncology

## 2017-12-15 NOTE — Telephone Encounter (Signed)
I spoke with patient to ask if he would be willing to participate in a prostate cancer research blood collection here at [facility]. I informed him he would need to sign a consent and he will be given a gift card. I asked him to arrive at 8 am.  He states he would be willing to participate.

## 2017-12-16 ENCOUNTER — Encounter: Payer: Self-pay | Admitting: *Deleted

## 2017-12-16 ENCOUNTER — Other Ambulatory Visit: Payer: Self-pay | Admitting: *Deleted

## 2017-12-16 DIAGNOSIS — C61 Malignant neoplasm of prostate: Secondary | ICD-10-CM

## 2017-12-17 ENCOUNTER — Telehealth: Payer: Self-pay | Admitting: Oncology

## 2017-12-17 ENCOUNTER — Inpatient Hospital Stay: Payer: Medicare HMO | Attending: Oncology | Admitting: Oncology

## 2017-12-17 ENCOUNTER — Inpatient Hospital Stay: Payer: Medicare HMO | Admitting: *Deleted

## 2017-12-17 ENCOUNTER — Inpatient Hospital Stay: Payer: Medicare HMO

## 2017-12-17 VITALS — BP 167/87 | HR 72 | Temp 99.3°F | Resp 20 | Ht 68.5 in | Wt 161.5 lb

## 2017-12-17 DIAGNOSIS — C7951 Secondary malignant neoplasm of bone: Secondary | ICD-10-CM | POA: Diagnosis not present

## 2017-12-17 DIAGNOSIS — I10 Essential (primary) hypertension: Secondary | ICD-10-CM | POA: Diagnosis not present

## 2017-12-17 DIAGNOSIS — C61 Malignant neoplasm of prostate: Secondary | ICD-10-CM

## 2017-12-17 DIAGNOSIS — E291 Testicular hypofunction: Secondary | ICD-10-CM

## 2017-12-17 LAB — COMPREHENSIVE METABOLIC PANEL
ALT: 16 U/L (ref 0–55)
AST: 23 U/L (ref 5–34)
Albumin: 3.6 g/dL (ref 3.5–5.0)
Alkaline Phosphatase: 58 U/L (ref 40–150)
Anion gap: 8 (ref 3–11)
BILIRUBIN TOTAL: 0.5 mg/dL (ref 0.2–1.2)
BUN: 11 mg/dL (ref 7–26)
CHLORIDE: 106 mmol/L (ref 98–109)
CO2: 28 mmol/L (ref 22–29)
CREATININE: 0.94 mg/dL (ref 0.70–1.30)
Calcium: 8.5 mg/dL (ref 8.4–10.4)
Glucose, Bld: 94 mg/dL (ref 70–140)
POTASSIUM: 4.1 mmol/L (ref 3.5–5.1)
Sodium: 142 mmol/L (ref 136–145)
Total Protein: 6.5 g/dL (ref 6.4–8.3)

## 2017-12-17 LAB — CBC WITH DIFFERENTIAL/PLATELET
BASOS PCT: 0 %
Basophils Absolute: 0 10*3/uL (ref 0.0–0.1)
EOS ABS: 0 10*3/uL (ref 0.0–0.5)
EOS PCT: 1 %
HCT: 38 % — ABNORMAL LOW (ref 38.4–49.9)
HEMOGLOBIN: 12.9 g/dL — AB (ref 13.0–17.1)
LYMPHS ABS: 1.5 10*3/uL (ref 0.9–3.3)
Lymphocytes Relative: 46 %
MCH: 31 pg (ref 27.2–33.4)
MCHC: 33.9 g/dL (ref 32.0–36.0)
MCV: 91.2 fL (ref 79.3–98.0)
Monocytes Absolute: 0.3 10*3/uL (ref 0.1–0.9)
Monocytes Relative: 8 %
NEUTROS PCT: 45 %
Neutro Abs: 1.5 10*3/uL (ref 1.5–6.5)
PLATELETS: 190 10*3/uL (ref 140–400)
RBC: 4.17 MIL/uL — ABNORMAL LOW (ref 4.20–5.82)
RDW: 14.2 % (ref 11.0–14.6)
WBC: 3.2 10*3/uL — AB (ref 4.0–10.3)

## 2017-12-17 NOTE — Progress Notes (Signed)
Hematology and Oncology Follow Up Visit  Austin Oconnor [id number] Aug 17, 1941 77 y.o. 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  He was diagnosed with advanced disease and Gleason score 4+5 = 9 prostate cancer 2015.   Prior Therapy:  He is status post radiation therapy to the area under the care of Dr. Tammi Klippel between 02/19/2016 and 03/01/2016.   He was treated with Lupron under the care of Dr. Luberta Robertson every 4 months.   His PSA was up to 0.49 in November 2017 and in February 2018 was 2.07 his testosterone was adequately castrate at that time.  Indicating castration resistant disease.  Current therapy: [name] 1000 mg daily with prednisone 5 mg daily started in March 2018.  Interim History: Austin Oconnor is here for a follow-up visit.  He continues to feel well without any major changes in his health.  He continues to exercise regularly and his performance status and activity level is unchanged.  He did take [name] and prednisone without new complications.  He denies any edema, joint pain or excessive fatigue.  His appetite remain excellent and his weight is stable.  He denies any new urological issues.  He denies any frequency or hematuria.  He denied any bone pain or pathological fractures.  He does not report any headaches, blurry vision, syncope or seizures. He is not report any fevers, chills or sweats.  He denies any weight loss or appetite changes.  He does not report any cough, wheezing or hemoptysis. He does not report any chest pain, palpitation, orthopnea or leg edema. He does not report any nausea, vomiting or abdominal pain. He does not report any constipation, diarrhea or oh satiety. He does not report any hematuria or dysuria.  He does not report any skin rashes or lesions.  Does not report lymphadenopathy or petechiae.   He does not report any heat or cold intolerance.  He denies any anxiety or depression.  Remaining review of systems is negative.   Medications: I have reviewed the patient's current medications.  Current Outpatient Medications  Medication Sig Dispense Refill  . abiraterone acetate ([name]) 250 MG tablet TAKE 4 TABLETS (1,000 MG TOTAL) BY MOUTH DAILY. TAKE ON AN EMPTY STOMACH 1 HOUR BEFORE OR 2 HOURS AFTER A MEAL 120 tablet 0  . Cholecalciferol (VITAMIN D3) 1000 UNITS CAPS Take 1 capsule by mouth daily.    Marland Kitchen lisinopril (PRINIVIL,ZESTRIL) 10 MG tablet Take 1 tablet by mouth daily.    . Multiple Vitamin (MULTIVITAMIN) tablet Take 1 tablet by mouth daily.    . predniSONE (DELTASONE) 5 MG tablet TAKE 1 TABLET (5 MG TOTAL) BY MOUTH DAILY WITH BREAKFAST. 60 tablet 3  . vitamin B-12 (CYANOCOBALAMIN) 1000 MCG tablet Take 1,000 mcg by mouth daily.     No current facility-administered medications for this visit.      Allergies: No Known Allergies  Past Medical History, Surgical history, Social history, and Family History were reviewed and updated.   Physical Exam: Blood pressure (!) 167/87, pulse 72, temperature 99.3 F (37.4 C), temperature source Oral, resp. rate 20, height 5' 8.5" (1.74 m), weight 161 lb 8 oz (73.3 kg), SpO2 100 %.   ECOG: 0 General appearance: Alert, awake gentleman appeared without distress. Head: Normocephalic, without obvious abnormality.  Oropharynx: Mucous membranes are moist and pink.  No oral thrush or ulcers. Eyes: No scleral icterus. Lymph nodes: Cervical, supraclavicular, and  axillary nodes normal. Heart:regular rate and rhythm, S1, S2 normal, no murmur, click, rub or gallop Lung: Clear to auscultation in all lung fields.  No wheezes or rhonchi. Abdomin: Soft, nontender without rebound or guarding. Musculoskeletal: No joint deformity or effusion. Skin: No rashes or lesions. Neurological: No motor, sensory deficits.   Lab Results: Lab Results  Component  Value Date   WBC 3.0 (L) 09/16/2017   HGB 13.2 09/16/2017   HCT 39.1 09/16/2017   MCV 91.4 09/16/2017   PLT 189 09/16/2017     Chemistry      Component Value Date/Time   NA 143 09/16/2017 0805   K 3.4 (L) 09/16/2017 0805   CO2 29 09/16/2017 0805   BUN 9.1 09/16/2017 0805   CREATININE 0.8 09/16/2017 0805      Component Value Date/Time   CALCIUM 8.4 09/16/2017 0805   ALKPHOS 53 09/16/2017 0805   AST 26 09/16/2017 0805   ALT 19 09/16/2017 0805   BILITOT 0.64 09/16/2017 0805      Results for Austin Oconnor, Austin Oconnor (MRN [id number]) as of 12/17/2017 09:04  Ref. Range 04/09/2017 08:16 06/10/2017 07:34 09/16/2017 08:05  Prostate Specific Ag, Serum Latest Ref Range: 0.0 - 4.0 ng/mL <0.1 <0.1 <0.1      Impression and Plan:  77 year old gentleman with the following issues:  1. Castration resistant prostate cancer with metastatic disease to the bone.  He presented with advanced disease in 2015 and subsequently developed castration resistant in 2018.  He is currently receiving [name] and prednisone since March 6213 without complications.  He had an excellent response to therapy and PSA continues to be undetectable.  Risks and benefits of continuing [name] and prednisone long-term was reviewed today.  Complication associated with this therapy were rediscussed again.  These complications that include fatigue, edema and hypertension among others.  Given his excellent response and tolerance he is agreeable to continue.  2. Androgen deprivation: Risks of continuing this medication long-term was discussed.  Is recommended to continue Lupron indefinitely and he will receive that at [facility].  These complications including osteoporosis as well as lack of muscle mass.  We have discussed strategies to improve his muscle mass which includes exercising regularly which she has been doing.  3. Bone directed therapy: Given his bone disease he is at risk of developing skeletal related events.  I  recommended continuing Delton See which she is already receiving at [facility] urology.  4.  Hypertension: His blood pressure checked periodically at home has been within normal range.  His blood pressure slightly elevated today and I have asked him to continue to monitor this.  Adjustment in his antihypertensive may be needed because of [name].  5.  Prostate cancer research study: We have discussed the possible enrollment and prostate cancer study that requires only blood sample and obtaining consent to do so.  I explained to him the ramification of the study and importance of participation if he is willing to.  I answered all his questions regarding this issue.  6.  Prognosis: Goals of care remains palliative but his disease is under excellent control and aggressive therapy is warranted.  7. Follow-up: Will be in 3 months.  25  minutes was spent with the patient face-to-face today.  More than 50% of time was dedicated to patient counseling, education and coordination of his care.    Zola Button, MD 2/6/20199:08 AM

## 2017-12-17 NOTE — Telephone Encounter (Signed)
Gave avs and calendar for may  °

## 2017-12-18 LAB — PROSTATE-SPECIFIC AG, SERUM (LABCORP)

## 2018-01-01 ENCOUNTER — Telehealth: Payer: Self-pay | Admitting: Pharmacy Technician

## 2018-01-01 ENCOUNTER — Other Ambulatory Visit: Payer: Self-pay | Admitting: Oncology

## 2018-01-01 DIAGNOSIS — C61 Malignant neoplasm of prostate: Secondary | ICD-10-CM

## 2018-01-01 NOTE — Telephone Encounter (Signed)
Oral Oncology Patient Advocate Encounter  Was successful in securing patient an $ 6500 grant from [employer] [place] (PAF) to provide copayment coverage for his [name].  This will keep the out of pocket expense at $0.    I have spoken with the patient.    The billing information is as follows and has been shared with [place].   Member ID: [id number] Group ID: [id number] RxBin: [id number] Dates of Eligibility: 07/04/2017 through 01/01/2019  Fabio Asa. Melynda Keller, [place] Patient [place] [phone] 01/01/2018 4:02 PM

## 2018-01-07 NOTE — Telephone Encounter (Signed)
Oral Oncology Patient Advocate Encounter  Fatima Sanger funding has been obtained to provide coverage for the patient's copayments.    Application for manufacturer assistance has been suspended for now.    Fabio Asa. Melynda Keller, [place] Patient [place] [phone] 01/07/2018 2:23 PM

## 2018-01-14 ENCOUNTER — Other Ambulatory Visit: Payer: Self-pay | Admitting: Urology

## 2018-01-14 DIAGNOSIS — C61 Malignant neoplasm of prostate: Secondary | ICD-10-CM

## 2018-01-20 ENCOUNTER — Other Ambulatory Visit: Payer: Self-pay | Admitting: Oncology

## 2018-01-20 DIAGNOSIS — C61 Malignant neoplasm of prostate: Secondary | ICD-10-CM

## 2018-01-20 MED FILL — ZYTIGA 250 MG TABLET: 250 | 30 days supply | Qty: 120 | Fill #0

## 2018-01-26 ENCOUNTER — Encounter (HOSPITAL_COMMUNITY)
Admission: RE | Admit: 2018-01-26 | Discharge: 2018-01-26 | Disposition: A | Discharge status: Home / Self Care | Payer: Medicare HMO | Source: Ambulatory Visit | Attending: Urology | Admitting: Urology

## 2018-01-26 DIAGNOSIS — C7951 Secondary malignant neoplasm of bone: Secondary | ICD-10-CM | POA: Diagnosis not present

## 2018-01-26 DIAGNOSIS — C61 Malignant neoplasm of prostate: Secondary | ICD-10-CM | POA: Diagnosis not present

## 2018-01-26 MED ORDER — TECHNETIUM TC 99M MEDRONATE IV KIT
25.0000 | PACK | Freq: Once | INTRAVENOUS | Status: AC | PRN
  Administered 2018-01-26: 20.5 via INTRAVENOUS

## 2018-02-11 ENCOUNTER — Other Ambulatory Visit: Payer: Self-pay | Admitting: Oncology

## 2018-02-11 DIAGNOSIS — C61 Malignant neoplasm of prostate: Secondary | ICD-10-CM

## 2018-02-16 MED FILL — ZYTIGA 250 MG TABLET: 250 | 30 days supply | Qty: 120 | Fill #0

## 2018-03-09 ENCOUNTER — Other Ambulatory Visit: Payer: Self-pay | Admitting: Oncology

## 2018-03-09 DIAGNOSIS — C61 Malignant neoplasm of prostate: Secondary | ICD-10-CM

## 2018-03-13 MED FILL — ZYTIGA 250 MG TABLET: 250 | 30 days supply | Qty: 120 | Fill #0

## 2018-03-17 ENCOUNTER — Inpatient Hospital Stay: Payer: Medicare HMO

## 2018-03-17 ENCOUNTER — Other Ambulatory Visit: Payer: Self-pay | Admitting: Oncology

## 2018-03-17 ENCOUNTER — Telehealth: Payer: Self-pay

## 2018-03-17 ENCOUNTER — Inpatient Hospital Stay: Payer: Medicare HMO | Attending: Oncology | Admitting: Oncology

## 2018-03-17 VITALS — BP 152/79 | HR 60 | Temp 98.4°F | Resp 17 | Ht 68.5 in | Wt 156.1 lb

## 2018-03-17 DIAGNOSIS — E291 Testicular hypofunction: Secondary | ICD-10-CM | POA: Diagnosis not present

## 2018-03-17 DIAGNOSIS — I10 Essential (primary) hypertension: Secondary | ICD-10-CM

## 2018-03-17 DIAGNOSIS — C7951 Secondary malignant neoplasm of bone: Secondary | ICD-10-CM

## 2018-03-17 DIAGNOSIS — C61 Malignant neoplasm of prostate: Secondary | ICD-10-CM

## 2018-03-17 LAB — CBC WITH DIFFERENTIAL (CANCER CENTER ONLY)
Basophils Absolute: 0 10*3/uL (ref 0.0–0.1)
Basophils Relative: 1 %
EOS PCT: 0 %
Eosinophils Absolute: 0 10*3/uL (ref 0.0–0.5)
HEMATOCRIT: 38.3 % — AB (ref 38.4–49.9)
Hemoglobin: 13.1 g/dL (ref 13.0–17.1)
LYMPHS PCT: 44 %
Lymphs Abs: 1.8 10*3/uL (ref 0.9–3.3)
MCH: 31 pg (ref 27.2–33.4)
MCHC: 34.3 g/dL (ref 32.0–36.0)
MCV: 90.4 fL (ref 79.3–98.0)
MONO ABS: 0.3 10*3/uL (ref 0.1–0.9)
MONOS PCT: 7 %
NEUTROS ABS: 1.9 10*3/uL (ref 1.5–6.5)
Neutrophils Relative %: 48 %
PLATELETS: 211 10*3/uL (ref 140–400)
RBC: 4.23 MIL/uL (ref 4.20–5.82)
RDW: 13.7 % (ref 11.0–14.6)
WBC Count: 4 10*3/uL (ref 4.0–10.3)

## 2018-03-17 LAB — CMP (CANCER CENTER ONLY)
ALT: 15 U/L (ref 0–55)
ANION GAP: 3 (ref 3–11)
AST: 21 U/L (ref 5–34)
Albumin: 4 g/dL (ref 3.5–5.0)
Alkaline Phosphatase: 47 U/L (ref 40–150)
BILIRUBIN TOTAL: 0.6 mg/dL (ref 0.2–1.2)
BUN: 13 mg/dL (ref 7–26)
CO2: 28 mmol/L (ref 22–29)
Calcium: 9 mg/dL (ref 8.4–10.4)
Chloride: 108 mmol/L (ref 98–109)
Creatinine: 0.92 mg/dL (ref 0.70–1.30)
GFR, Est AFR Am: >60 mL/min (ref >60)
GFR, Estimated: >60 mL/min (ref >60)
GLUCOSE: 109 mg/dL (ref 70–140)
POTASSIUM: 4.1 mmol/L (ref 3.5–5.1)
Sodium: 139 mmol/L (ref 136–145)
TOTAL PROTEIN: 6.6 g/dL (ref 6.4–8.3)

## 2018-03-17 NOTE — Telephone Encounter (Signed)
Printed avs and calender of upcoming appointment. Per 5/7 los 

## 2018-03-17 NOTE — Progress Notes (Signed)
Hematology and Oncology Follow Up Visit  Austin Oconnor [id number] [birth year] 77 y.o. 03/17/2018 9:39 AM [name], MDVaradarajan, [name],*   Principle Diagnosis: 77 year old man with castration-resistant metastatic prostate cancer initially diagnosed in 2015 and subsequently developed advanced disease in 2018.  His initial Gleason score 4+5 = 9.    Prior Therapy:  He is status post radiation therapy to the area under the care of Dr. Tammi Klippel between 02/19/2016 and 03/01/2016.   He was treated with Lupron under the care of Dr. Luberta Robertson every 4 months.   His PSA was up to 0.49 in November 2017 and in February 2018 was 2.07 his testosterone was adequately castrate at that time.  Indicating castration resistant disease.  Current therapy: [name] 1000 mg daily with prednisone 5 mg daily started in March 2018.  Interim History: Mr. Delay resents today for a follow-up visit.  He reports no major changes in his health since last evaluation.  He denies any complications related to [name] or prednisone.  He denies any nausea excessive fatigue or tiredness.  He denies any difficulties obtaining or taking this medication.  He denies any arthralgias or myalgias.  His performance status and quality of life remain excellent.   He does not report any headaches, blurry vision, syncope or seizures.  He denies any peripheral neuropathy or alteration in mental status.  He is not report any fevers, chills or sweats.  Weight has been stable.  He does not report any cough, wheezing or hemoptysis. He does not report any chest pain, palpitation, orthopnea or leg edema. He does not report any nausea, vomiting or abdominal pain. He does not report any constipation, diarrhea. He does not report any hematuria or dysuria.  He does not report any skin rashes or lesions.  Does not report lymphadenopathy or petechiae.  Remaining review of systems is negative.   Medications: I have reviewed the patient's  current medications.  Current Outpatient Medications  Medication Sig Dispense Refill  . Cholecalciferol (VITAMIN D3) 1000 UNITS CAPS Take 1 capsule by mouth daily.    Marland Kitchen lisinopril (PRINIVIL,ZESTRIL) 10 MG tablet Take 1 tablet by mouth daily.    . Multiple Vitamin (MULTIVITAMIN) tablet Take 1 tablet by mouth daily.    . predniSONE (DELTASONE) 5 MG tablet TAKE 1 TABLET (5 MG TOTAL) BY MOUTH DAILY WITH BREAKFAST. 60 tablet 2  . vitamin B-12 (CYANOCOBALAMIN) 1000 MCG tablet Take 1,000 mcg by mouth daily.    Marland Kitchen [name] 250 MG tablet TAKE 4 TABLETS (1,000 MG TOTAL) BY MOUTH DAILY. TAKE ON AN EMPTY STOMACH 1 HOUR BEFORE OR 2 HOURS AFTER A MEAL 120 tablet 0   No current facility-administered medications for this visit.      Allergies: No Known Allergies  Past Medical History, Surgical history, Social history, and Family History were reviewed and updated.   Physical Exam: Blood pressure (!) 152/79, pulse 60, temperature 98.4 F (36.9 C), temperature source Oral, resp. rate 17, height 5' 8.5" (1.74 m), weight 156 lb 1.6 oz (70.8 kg), SpO2 100 %.    ECOG: 0 General appearance: Well-appearing gentleman without distress. Head: Atraumatic without abnormalities. Oropharynx: No oral ulcers or lesions. Eyes: Nipples are equal and round reactive to light. Lymph nodes: No lymphadenopathy noted in the cervical, supraclavicular, and axillary regions. Heart:regular rate and rhythm, without any murmurs or gallops. Lung: Clear without any rhonchi, wheezes or dullness to percussion. Abdomin: Soft, no rebound or guarding.  No shifting dullness or ascites. Musculoskeletal: No clubbing or cyanosis. Skin: No  ecchymosis or petechia. Neurological: Intact on exam motor, sensory or deep tendon reflexes.   Lab Results: Lab Results  Component Value Date   WBC 3.2 (L) 12/17/2017   HGB 12.9 (L) 12/17/2017   HCT 38.0 (L) 12/17/2017   MCV 91.2 12/17/2017   PLT 190 12/17/2017     Chemistry      Component  Value Date/Time   NA 142 12/17/2017 0840   NA 143 09/16/2017 0805   K 4.1 12/17/2017 0840   K 3.4 (L) 09/16/2017 0805   CL 106 12/17/2017 0840   CO2 28 12/17/2017 0840   CO2 29 09/16/2017 0805   BUN 11 12/17/2017 0840   BUN 9.1 09/16/2017 0805   CREATININE 0.94 12/17/2017 0840   CREATININE 0.8 09/16/2017 0805      Component Value Date/Time   CALCIUM 8.5 12/17/2017 0840   CALCIUM 8.4 09/16/2017 0805   ALKPHOS 58 12/17/2017 0840   ALKPHOS 53 09/16/2017 0805   AST 23 12/17/2017 0840   AST 26 09/16/2017 0805   ALT 16 12/17/2017 0840   ALT 19 09/16/2017 0805   BILITOT 0.5 12/17/2017 0840   BILITOT 0.64 09/16/2017 0805       Results for MADDIX, HEINZ (MRN [id number]) as of 03/17/2018 09:40  Ref. Range 09/16/2017 08:05 12/17/2017 08:40  Prostate Specific Ag, Serum Latest Ref Range: 0.0 - 4.0 ng/mL <0.1 <0.1      Impression and Plan:  77 year old gentleman with the following issues:  1. Castration-resistant prostate cancer documented in 2018 with disease to the bone.  He remains on [name] and prednisone with excellent response to therapy and PSA remains undetectable.  His last bone scan obtained on January 26, 2018 was personally reviewed and showed stable disease overall.  Risks and benefits of continuing [name] at this time was discussed and is agreeable to continue.  Different salvage therapy may be needed in the future if his PSA starts to rise rapidly.  2. Androgen deprivation: He continues to receive Lupron under the care of Dr. Diona Fanti at [facility] urology.  I recommended continuing treatment indefinitely.  3. Bone directed therapy: He will be a good candidate for Xgeva if he is not receiving at [facility] urology.  We will continue to address that with him in the future.  4.  Hypertension: Slightly elevated today but his ambulatory monitoring has been within normal range.  5.  Goals of care and prognosis: He has an incurable malignancy but with an excellent performance  status.  Aggressive therapy is warranted.  6. Follow-up: Will be in 3 months.  15  minutes was spent with the patient face-to-face today.  More than 50% of time was dedicated to patient counseling, education and addressing future plan of care.    Zola Button, MD 5/7/20199:39 AM

## 2018-03-18 LAB — PROSTATE-SPECIFIC AG, SERUM (LABCORP): PROSTATE SPECIFIC AG, SERUM: 0.5 ng/mL (ref 0.0–4.0)

## 2018-04-10 ENCOUNTER — Other Ambulatory Visit: Payer: Self-pay | Admitting: Oncology

## 2018-04-10 DIAGNOSIS — C61 Malignant neoplasm of prostate: Secondary | ICD-10-CM

## 2018-04-17 MED FILL — ZYTIGA 250 MG TABLET: 250 | 30 days supply | Qty: 120 | Fill #0

## 2018-05-11 ENCOUNTER — Other Ambulatory Visit: Payer: Self-pay | Admitting: Oncology

## 2018-05-11 DIAGNOSIS — C61 Malignant neoplasm of prostate: Secondary | ICD-10-CM

## 2018-05-21 MED FILL — ZYTIGA 250 MG TABLET: 250 | 30 days supply | Qty: 120 | Fill #0

## 2018-06-16 ENCOUNTER — Inpatient Hospital Stay: Payer: Medicare HMO

## 2018-06-16 ENCOUNTER — Inpatient Hospital Stay: Payer: Medicare HMO | Attending: Oncology | Admitting: Oncology

## 2018-06-16 ENCOUNTER — Telehealth: Payer: Self-pay | Admitting: Oncology

## 2018-06-16 VITALS — BP 130/86 | HR 76 | Temp 98.3°F | Resp 17 | Ht 68.5 in | Wt 155.5 lb

## 2018-06-16 DIAGNOSIS — C7951 Secondary malignant neoplasm of bone: Secondary | ICD-10-CM | POA: Insufficient documentation

## 2018-06-16 DIAGNOSIS — C61 Malignant neoplasm of prostate: Secondary | ICD-10-CM

## 2018-06-16 DIAGNOSIS — E291 Testicular hypofunction: Secondary | ICD-10-CM | POA: Insufficient documentation

## 2018-06-16 DIAGNOSIS — I10 Essential (primary) hypertension: Secondary | ICD-10-CM | POA: Diagnosis not present

## 2018-06-16 LAB — CMP (CANCER CENTER ONLY)
ALBUMIN: 4.1 g/dL (ref 3.5–5.0)
ALT: 18 U/L (ref 0–44)
AST: 21 U/L (ref 15–41)
Alkaline Phosphatase: 53 U/L (ref 38–126)
Anion gap: 10 (ref 5–15)
BUN: 13 mg/dL (ref 8–23)
CO2: 27 mmol/L (ref 22–32)
Calcium: 9.3 mg/dL (ref 8.9–10.3)
Chloride: 106 mmol/L (ref 98–111)
Creatinine: 1.16 mg/dL (ref 0.61–1.24)
GFR, Est AFR Am: >60 mL/min (ref >60)
GFR, Estimated: 59 mL/min — ABNORMAL LOW (ref >60)
GLUCOSE: 104 mg/dL — AB (ref 70–99)
POTASSIUM: 5.5 mmol/L — AB (ref 3.5–5.1)
SODIUM: 143 mmol/L (ref 135–145)
Total Bilirubin: 0.8 mg/dL (ref 0.3–1.2)
Total Protein: 7.3 g/dL (ref 6.5–8.1)

## 2018-06-16 LAB — CBC WITH DIFFERENTIAL (CANCER CENTER ONLY)
BASOS PCT: 0 %
Basophils Absolute: 0 10*3/uL (ref 0.0–0.1)
EOS ABS: 0 10*3/uL (ref 0.0–0.5)
Eosinophils Relative: 0 %
HEMATOCRIT: 41.2 % (ref 38.4–49.9)
HEMOGLOBIN: 13.9 g/dL (ref 13.0–17.1)
Lymphocytes Relative: 50 %
Lymphs Abs: 2.2 10*3/uL (ref 0.9–3.3)
MCH: 31.2 pg (ref 27.2–33.4)
MCHC: 33.7 g/dL (ref 32.0–36.0)
MCV: 92.4 fL (ref 79.3–98.0)
Monocytes Absolute: 0.4 10*3/uL (ref 0.1–0.9)
Monocytes Relative: 8 %
NEUTROS ABS: 1.8 10*3/uL (ref 1.5–6.5)
Neutrophils Relative %: 42 %
PLATELETS: 196 10*3/uL (ref 140–400)
RBC: 4.46 MIL/uL (ref 4.20–5.82)
RDW: 13.3 % (ref 11.0–14.6)
WBC: 4.4 10*3/uL (ref 4.0–10.3)

## 2018-06-16 NOTE — Progress Notes (Signed)
Hematology and Oncology Follow Up Visit  Austin Oconnor [id number] [birth year] 77 y.o. 06/16/2018 9:29 AM Leeroy Oconnor, MDVaradarajan, [name],*   Principle Diagnosis: 77 year old man with castration-resistant metastatic prostate cancer documented in 2018 with initial diagnosis in 2015 with a Gleason score 4+5 = 9.     Prior Therapy:  He is status post radiation therapy to the area under the care of Dr. Tammi Klippel between 02/19/2016 and 03/01/2016.   He was treated with Lupron under the care of Dr. Luberta Robertson every 4 months.   His PSA was up to 0.49 in November 2017 and in February 2018 was 2.07 his testosterone was adequately castrate at that time.  Indicating castration resistant disease.  Current therapy: [name] 1000 mg daily with prednisone 5 mg daily started in March 2018.  Interim History: [name] is here for a follow-up visit.  Since last visit, he reports no major complaints.  He denies any complications related to [name] or prednisone.  He denies any nausea excessive fatigue or tiredness.  He remains active and continues to attend to activities of daily living.  Continues to exercise regularly.  He denies any worsening edema or increase in his blood pressure.  He has no difficulties obtaining this medication.  He denies any bone pain or pathological fractures.  He does not report any headaches, blurry vision, syncope or seizures.  He denies any dizziness or confusion.  He is not report any fevers, chills or sweats.    He does not report any cough, wheezing or hemoptysis. He does not report any chest pain, palpitation, orthopnea or leg edema. He does not report any nausea, vomiting or abdominal pain. He does not report any change in his bowel habits.  He does not report any hematuria or dysuria.  He does not report any skin rashes or lesions.  Does not report lymphadenopathy or petechiae.  He denies any bleeding or clotting tendencies.  Remaining review of systems is  negative.   Medications: I have reviewed the patient's current medications.  Current Outpatient Medications  Medication Sig Dispense Refill  . Cholecalciferol (VITAMIN D3) 1000 UNITS CAPS Take 1 capsule by mouth daily.    Marland Kitchen lisinopril (PRINIVIL,ZESTRIL) 10 MG tablet Take 1 tablet by mouth daily.    . Multiple Vitamin (MULTIVITAMIN) tablet Take 1 tablet by mouth daily.    . predniSONE (DELTASONE) 5 MG tablet TAKE 1 TABLET (5 MG TOTAL) BY MOUTH DAILY WITH BREAKFAST. 60 tablet 2  . vitamin B-12 (CYANOCOBALAMIN) 1000 MCG tablet Take 1,000 mcg by mouth daily.    Marland Kitchen [name] 250 MG tablet TAKE 4 TABLETS (1,000 MG TOTAL) BY MOUTH DAILY. TAKE ON AN EMPTY STOMACH 1 HOUR BEFORE OR 2 HOURS AFTER A MEAL 120 tablet 0   No current facility-administered medications for this visit.      Allergies: No Known Allergies  Past Medical History, Surgical history, Social history, and Family History were reviewed and updated.   Physical Exam: Blood pressure 130/86, pulse 76, temperature 98.3 F (36.8 C), temperature source Oral, resp. rate 17, height 5' 8.5" (1.74 m), weight 155 lb 8 oz (70.5 kg), SpO2 97 %.    ECOG: 0 General appearance: Alert, awake gentleman without distress. Head: Normocephalic without abnormalities. Oropharynx: No oral thrush or ulcers. Eyes: Sclera anicteric. Lymph nodes: No cervical, supraclavicular, inguinal or axillary lymphadenopathy.   Heart:regular rate and rhythm, S1 and S2 without any leg edema. Lung: Clear that any rhonchi, wheezes or dullness to percussion. Abdomin: Soft, nontender, nondistended without any  rebound or guarding. Musculoskeletal: No joint deformity or effusion. Skin: No skin rashes or lesions. Neurological: Intact motor, sensory exam.   Lab Results: Lab Results  Component Value Date   WBC 4.4 06/16/2018   HGB 13.9 06/16/2018   HCT 41.2 06/16/2018   MCV 92.4 06/16/2018   PLT 196 06/16/2018     Chemistry      Component Value Date/Time   NA 139  03/17/2018 0921   NA 143 09/16/2017 0805   K 4.1 03/17/2018 0921   K 3.4 (L) 09/16/2017 0805   CL 108 03/17/2018 0921   CO2 28 03/17/2018 0921   CO2 29 09/16/2017 0805   BUN 13 03/17/2018 0921   BUN 9.1 09/16/2017 0805   CREATININE 0.92 03/17/2018 0921   CREATININE 0.8 09/16/2017 0805      Component Value Date/Time   CALCIUM 9.0 03/17/2018 0921   CALCIUM 8.4 09/16/2017 0805   ALKPHOS 47 03/17/2018 0921   ALKPHOS 53 09/16/2017 0805   AST 21 03/17/2018 0921   AST 26 09/16/2017 0805   ALT 15 03/17/2018 0921   ALT 19 09/16/2017 0805   BILITOT 0.6 03/17/2018 0921   BILITOT 0.64 09/16/2017 0805       Results for AWAIS, COBARRUBIAS (MRN [id number]) as of 06/16/2018 09:12  Ref. Range 12/17/2017 08:40 03/17/2018 09:21  Prostate Specific Ag, Serum Latest Ref Range: 0.0 - 4.0 ng/mL <0.1 0.5       Impression and Plan:  77 year old gentleman with the:  1.  Prostate cancer diagnosed in 2015 and developed castration-resistant disease to the bone in 2018.  He is currently on [name] that has been well-tolerated with PSA that was undetectable up till February 2019.  In May 2019 his PSA started to rise slowly.  The natural course of this disease as well as treatment options were reviewed today.  I recommended continued [name] and monitoring his PSA in the next few months.  I recommend restaging him with CT scan and bone scan if his PSA is continues to rise and different salvage therapy may be needed.  His options could be in the form of Xtandi or systemic chemotherapy.  2. Androgen deprivation: He remains on Lupron without any complications.  I recommended continuing this indefinitely.  3. Bone directed therapy: He is at risk of developing skeletal related events.  After repeating his bone scan will assess his risk receiving Xgeva.  4.  Hypertension: His blood pressure is within normal range at this time and per his report periodic measurements have been within normal range.  We will continue to  monitor his blood pressure on [name] for the time being.  5.  Goals of care and prognosis: Treatment remains palliative but aggressive therapy is warranted.  His performance status is excellent.  6. Follow-up: Will be in 3 months.  15  minutes was spent with the patient face-to-face today.  More than 50% of time was dedicated to discussing the natural course of this disease, and future treatment options and coordinating his care.    Zola Button, MD 8/6/20199:29 AM

## 2018-06-16 NOTE — Telephone Encounter (Signed)
Gave patient avs and calendar of upcoming appts.  °

## 2018-06-17 LAB — PROSTATE-SPECIFIC AG, SERUM (LABCORP): Prostate Specific Ag, Serum: 2.5 ng/mL (ref 0.0–4.0)

## 2018-06-25 ENCOUNTER — Other Ambulatory Visit: Payer: Self-pay | Admitting: Oncology

## 2018-06-25 DIAGNOSIS — C61 Malignant neoplasm of prostate: Secondary | ICD-10-CM

## 2018-06-25 MED FILL — ZYTIGA 250 MG TABLET: 250 | 30 days supply | Qty: 120 | Fill #0

## 2018-07-08 ENCOUNTER — Other Ambulatory Visit: Payer: Self-pay | Admitting: Oncology

## 2018-07-08 DIAGNOSIS — C61 Malignant neoplasm of prostate: Secondary | ICD-10-CM

## 2018-07-20 MED FILL — ZYTIGA 250 MG TABLET: 250 | 30 days supply | Qty: 120 | Fill #0

## 2018-08-06 ENCOUNTER — Other Ambulatory Visit: Payer: Self-pay | Admitting: Oncology

## 2018-08-06 DIAGNOSIS — C61 Malignant neoplasm of prostate: Secondary | ICD-10-CM

## 2018-08-18 MED FILL — ZYTIGA 250 MG TABLET: 250 | 30 days supply | Qty: 120 | Fill #0

## 2018-09-08 ENCOUNTER — Other Ambulatory Visit: Payer: Self-pay | Admitting: Oncology

## 2018-09-08 DIAGNOSIS — C61 Malignant neoplasm of prostate: Secondary | ICD-10-CM

## 2018-09-15 ENCOUNTER — Telehealth: Payer: Self-pay | Admitting: Pharmacy Technician

## 2018-09-15 ENCOUNTER — Telehealth: Payer: Self-pay | Admitting: Oncology

## 2018-09-15 ENCOUNTER — Inpatient Hospital Stay (HOSPITAL_BASED_OUTPATIENT_CLINIC_OR_DEPARTMENT_OTHER): Payer: Medicare HMO | Admitting: Oncology

## 2018-09-15 ENCOUNTER — Inpatient Hospital Stay: Payer: Medicare HMO | Attending: Oncology

## 2018-09-15 ENCOUNTER — Telehealth: Payer: Self-pay

## 2018-09-15 VITALS — BP 136/83 | HR 73 | Temp 98.2°F | Resp 17 | Ht 68.5 in | Wt 160.7 lb

## 2018-09-15 DIAGNOSIS — Z923 Personal history of irradiation: Secondary | ICD-10-CM

## 2018-09-15 DIAGNOSIS — C61 Malignant neoplasm of prostate: Secondary | ICD-10-CM

## 2018-09-15 DIAGNOSIS — Z79899 Other long term (current) drug therapy: Secondary | ICD-10-CM

## 2018-09-15 DIAGNOSIS — I10 Essential (primary) hypertension: Secondary | ICD-10-CM

## 2018-09-15 LAB — CBC WITH DIFFERENTIAL (CANCER CENTER ONLY)
Abs Immature Granulocytes: 0.02 10*3/uL (ref 0.00–0.07)
BASOS ABS: 0 10*3/uL (ref 0.0–0.1)
Basophils Relative: 0 %
EOS PCT: 0 %
Eosinophils Absolute: 0 10*3/uL (ref 0.0–0.5)
HCT: 39.7 % (ref 39.0–52.0)
HEMOGLOBIN: 13.3 g/dL (ref 13.0–17.0)
Immature Granulocytes: 1 %
LYMPHS ABS: 1.9 10*3/uL (ref 0.7–4.0)
LYMPHS PCT: 48 %
MCH: 30.7 pg (ref 26.0–34.0)
MCHC: 33.5 g/dL (ref 30.0–36.0)
MCV: 91.7 fL (ref 80.0–100.0)
MONO ABS: 0.4 10*3/uL (ref 0.1–1.0)
Monocytes Relative: 10 %
NEUTROS ABS: 1.6 10*3/uL — AB (ref 1.7–7.7)
NRBC: 0 % (ref 0.0–0.2)
Neutrophils Relative %: 41 %
Platelet Count: 235 10*3/uL (ref 150–400)
RBC: 4.33 MIL/uL (ref 4.22–5.81)
RDW: 12.7 % (ref 11.5–15.5)
WBC Count: 4 10*3/uL (ref 4.0–10.5)

## 2018-09-15 LAB — CMP (CANCER CENTER ONLY)
ALBUMIN: 3.8 g/dL (ref 3.5–5.0)
ALK PHOS: 74 U/L (ref 38–126)
ALT: 15 U/L (ref 0–44)
AST: 21 U/L (ref 15–41)
Anion gap: 8 (ref 5–15)
BUN: 10 mg/dL (ref 8–23)
CHLORIDE: 104 mmol/L (ref 98–111)
CO2: 28 mmol/L (ref 22–32)
CREATININE: 1.05 mg/dL (ref 0.61–1.24)
Calcium: 8.8 mg/dL — ABNORMAL LOW (ref 8.9–10.3)
Glucose, Bld: 96 mg/dL (ref 70–99)
Potassium: 4.3 mmol/L (ref 3.5–5.1)
SODIUM: 140 mmol/L (ref 135–145)
Total Bilirubin: 0.4 mg/dL (ref 0.3–1.2)
Total Protein: 7 g/dL (ref 6.5–8.1)

## 2018-09-15 MED FILL — ZYTIGA 250 MG TABLET: 250 | 30 days supply | Qty: 120 | Fill #0

## 2018-09-15 NOTE — Telephone Encounter (Signed)
Completed by Sharyne Peach Per 11/5 los

## 2018-09-15 NOTE — Telephone Encounter (Signed)
Appts scheduled avs/calendar printed/ contrast provided per 11/5 los

## 2018-09-15 NOTE — Progress Notes (Signed)
Hematology and Oncology Follow Up Visit  Austin Oconnor [id number] 12/05/1940 77 y.o. 09/15/2018 9:12 AM [name], MDVaradarajan, [name],*   Principle Diagnosis: 77 year old man with prostate cancer diagnosed in 2015 with a Gleason score of 4+5 = 9.  He has castration-resistant disease with bone involvement.  Prior Therapy:  He is status post radiation therapy to the area under the care of Dr. Tammi Klippel between 02/19/2016 and 03/01/2016.   He was treated with Lupron under the care of Dr. Luberta Robertson every 4 months.   His PSA was up to 0.49 in November 2017 and in February 2018 was 2.07 his testosterone was adequately castrate at that time.  Indicating castration resistant disease.  Current therapy: [name] 1000 mg daily with prednisone 5 mg daily started in March 2018.  Interim History: Austin Oconnor returns today for a repeat evaluation.  Since the last visit, he reports no major changes or complaints.  He remains active and attends to activities of daily living.  He has tolerated [name] without any recent side effects.  He denies any nausea, abdominal distention or edema.  He denies any bone pain or discomfort.  He continues to be active and exercises regularly.  His quality of life performance status remains excellent.  He does not report any headaches, blurry vision, syncope or seizures.  He denies any alteration in mental status or lethargy.  He is not report any fevers, chills or sweats.    He does not report any cough, wheezing or hemoptysis. He does not report any chest pain, palpitation, orthopnea or leg edema. He does not report any nausea, vomiting or abdominal pain. He does not report any constipation or diarrhea.  He does not report any hematuria or dysuria.  He does not report any ecchymosis or petechiae.  Does not report lymphadenopathy or easy bruising.  He denies any changes in his mood.  Remaining review of systems is negative.   Medications: I have reviewed the  patient's current medications.  Current Outpatient Medications  Medication Sig Dispense Refill  . Cholecalciferol (VITAMIN D3) 1000 UNITS CAPS Take 1 capsule by mouth daily.    Marland Kitchen lisinopril (PRINIVIL,ZESTRIL) 10 MG tablet Take 1 tablet by mouth daily.    . Multiple Vitamin (MULTIVITAMIN) tablet Take 1 tablet by mouth daily.    . predniSONE (DELTASONE) 5 MG tablet TAKE 1 TABLET (5 MG TOTAL) BY MOUTH DAILY WITH BREAKFAST. 60 tablet 2  . vitamin B-12 (CYANOCOBALAMIN) 1000 MCG tablet Take 1,000 mcg by mouth daily.    Marland Kitchen [name] 250 MG tablet TAKE 4 TABLETS (1,000 MG TOTAL) BY MOUTH DAILY. TAKE ON AN EMPTY STOMACH 1 HOUR BEFORE OR 2 HOURS AFTER A MEAL 120 tablet 0   No current facility-administered medications for this visit.      Allergies: No Known Allergies  Past Medical History, Surgical history, Social history, and Family History were reviewed and updated.   Physical Exam: Blood pressure 136/83, pulse 73, temperature 98.2 F (36.8 C), temperature source Oral, resp. rate 17, height 5' 8.5" (1.74 m), weight 160 lb 11.2 oz (72.9 kg), SpO2 99 %.     ECOG: 0   General appearance: Comfortable appearing without any discomfort Head: Normocephalic without any trauma Oropharynx: Mucous membranes are moist and pink without any thrush or ulcers. Eyes: Pupils are equal and round reactive to light. Lymph nodes: No cervical, supraclavicular, inguinal or axillary lymphadenopathy.   Heart:regular rate and rhythm.  S1 and S2 without leg edema. Lung: Clear without any rhonchi or wheezes.  No dullness to percussion. Abdomin: Soft, nontender, nondistended with good bowel sounds.  No hepatosplenomegaly. Musculoskeletal: No joint deformity or effusion.  Full range of motion noted. Neurological: No deficits noted on motor, sensory and deep tendon reflex exam. Skin: No petechial rash or dryness.  Appeared moist.    Lab Results: Lab Results  Component Value Date   WBC 4.0 09/15/2018   HGB 13.3  09/15/2018   HCT 39.7 09/15/2018   MCV 91.7 09/15/2018   PLT 235 09/15/2018     Chemistry      Component Value Date/Time   NA 143 06/16/2018 0855   NA 143 09/16/2017 0805   K 5.5 (H) 06/16/2018 0855   K 3.4 (L) 09/16/2017 0805   CL 106 06/16/2018 0855   CO2 27 06/16/2018 0855   CO2 29 09/16/2017 0805   BUN 13 06/16/2018 0855   BUN 9.1 09/16/2017 0805   CREATININE 1.16 06/16/2018 0855   CREATININE 0.8 09/16/2017 0805      Component Value Date/Time   CALCIUM 9.3 06/16/2018 0855   CALCIUM 8.4 09/16/2017 0805   ALKPHOS 53 06/16/2018 0855   ALKPHOS 53 09/16/2017 0805   AST 21 06/16/2018 0855   AST 26 09/16/2017 0805   ALT 18 06/16/2018 0855   ALT 19 09/16/2017 0805   BILITOT 0.8 06/16/2018 0855   BILITOT 0.64 09/16/2017 0805       Results for MANNY, VITOLO (MRN [id number]) as of 09/15/2018 09:12  Ref. Range 03/17/2018 09:21 06/16/2018 08:55  Prostate Specific Ag, Serum Latest Ref Range: 0.0 - 4.0 ng/mL 0.5 2.5        Impression and Plan:  77 year old man with:  1.  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  He is currently on [name] with prednisone without any major complications.  His PSA is showing slow rise although he is completely asymptomatic at this time.  I recommended repeat his staging work-up with a CT scan and a bone scan in the near future consideration for different salvage therapy depending on his disease status.  These options were reviewed today which include Darron Doom, systemic chemotherapy among others.  For the time being we will continue on [name] until he completes his staging work-up.  2. Androgen deprivation: He is currently on Lupron under the care of Dr. Diona Fanti.  I recommended continuing this indefinitely.  3. Bone directed therapy: He is currently receiving Xgeva under the care of Dr. Diona Fanti.  I recommended continuing this for the time being.  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  4.  Hypertension: No issues reported at this time.  His blood pressure continues to be within normal range.  5.  Goals of care and prognosis: His performance status remain excellent and aggressive therapy is warranted.  The goal is palliative however.  6. Follow-up: Will be in February 2020.  15  minutes was spent with the patient face-to-face today.  More than 50% of time was dedicated to reviewing his disease status, treatment options and answer question regarding his long-term care.    Zola Button, MD 11/5/20199:12 AM

## 2018-09-15 NOTE — Telephone Encounter (Signed)
Oral Oncology Patient Advocate Encounter   Received a call from [place] at the [facility] that the patient did not have enough funds left from the PAF grant to cover his copay.    Was successful in securing patient a $7500 grant from [employer] to provide copayment coverage for [place].  This will keep the out of pocket expense at $0.     The billing information is as follows and has been shared with [place].   Member ID: [id number]  Group ID: [id number] RxBin: [id number] PCN: [place] Patient [place] Phone [phone] Fax [phone] 09/15/2018 10:02 AM

## 2018-09-16 LAB — PROSTATE-SPECIFIC AG, SERUM (LABCORP): PROSTATE SPECIFIC AG, SERUM: 14 ng/mL — AB (ref 0.0–4.0)

## 2018-09-26 ENCOUNTER — Other Ambulatory Visit: Payer: Self-pay | Admitting: Oncology

## 2018-09-26 DIAGNOSIS — C61 Malignant neoplasm of prostate: Secondary | ICD-10-CM

## 2018-10-05 ENCOUNTER — Other Ambulatory Visit: Payer: Self-pay | Admitting: Oncology

## 2018-10-05 DIAGNOSIS — C61 Malignant neoplasm of prostate: Secondary | ICD-10-CM

## 2018-10-16 MED FILL — ZYTIGA 250 MG TABLET: 250 | 30 days supply | Qty: 120 | Fill #0

## 2018-11-05 ENCOUNTER — Other Ambulatory Visit: Payer: Self-pay | Admitting: Oncology

## 2018-11-05 DIAGNOSIS — C61 Malignant neoplasm of prostate: Secondary | ICD-10-CM

## 2018-11-13 MED FILL — ZYTIGA 250 MG TABLET: 250 | 30 days supply | Qty: 120 | Fill #0

## 2018-11-20 ENCOUNTER — Telehealth: Payer: Self-pay

## 2018-11-20 NOTE — Telephone Encounter (Signed)
Oral Oncology Patient Advocate Encounter  Patient Austin Oconnor foundation offered a 2nd grant. I applied for that to cover the next fill before 12/08/18.  I was successful at securing a grant with [employer] for $7,300. This will keep the out of pocket expense for [name] at $0. The grant information is as follows and has been shared with [name].  Approval dates: 11/20/18-12/08/18 ID: [id number] Group: [id number] BIN: [id number] PCN: [place] Patient [name] Phone [phone] Fax [phone]

## 2018-12-03 ENCOUNTER — Other Ambulatory Visit: Payer: Self-pay | Admitting: Oncology

## 2018-12-03 DIAGNOSIS — C61 Malignant neoplasm of prostate: Secondary | ICD-10-CM

## 2018-12-14 ENCOUNTER — Telehealth: Payer: Self-pay | Admitting: Pharmacist

## 2018-12-14 DIAGNOSIS — C61 Malignant neoplasm of prostate: Secondary | ICD-10-CM

## 2018-12-14 MED ORDER — ABIRATERONE ACETATE 250 MG PO TABS: 1000.0000 mg | ORAL_TABLET | Freq: Every day | ORAL | 0 refills | Status: DC

## 2018-12-14 MED FILL — ABIRATERONE ACETATE 250 MG: 250 | 30 days supply | Qty: 120 | Fill #0

## 2018-12-14 NOTE — Telephone Encounter (Signed)
Oral Oncology Pharmacist Encounter  Received notification from the [facility] long outpatient pharmacy that they are unable to process patient's next fill of [name] as rejection and the pharmacy system is stating that [name] requires prior authorization.  Upon further investigation, insurance authorization is not required for the generic product, abiraterone.  Patient has been receiving brand name [name] as when patient was originally started on the medication he had Pharmacist, community, with co-pay covered by manufacturer copayment coupon. Patient now with Medicare prescription coverage. Copayment due at the pharmacy is greatly decreased if filling for the generic product.  Oral oncology patient advocate reached out to patient to ensure that he is not still double covered for prescription insurance as he was back in 2018. Patient confirmed he only has [name] healthcare Medicare advantage plan at this time. Patient informed that pharmacy will be filling the generic product.  New prescription for abiraterone 250 mg tablets, take 4 tablets (1000 mg) by mouth once daily on an empty stomach 1 hour before or 2 hours after meals, quantity #120, refills = 0, has been E scribed to the [facility] long outpatient pharmacy. New prescription was required as prescription they had on file was dispensed as written and the pharmacy would not have been able to fill the generic product with that prescription.  Austin Oconnor, PharmD, BCPS, BCOP  12/14/2018 12:45 PM Oral Oncology Clinic [phone]

## 2018-12-15 ENCOUNTER — Inpatient Hospital Stay: Payer: Medicare Other | Attending: Oncology

## 2018-12-15 ENCOUNTER — Encounter (HOSPITAL_COMMUNITY): Payer: Self-pay

## 2018-12-15 ENCOUNTER — Ambulatory Visit (HOSPITAL_COMMUNITY)
Admission: RE | Admit: 2018-12-15 | Discharge: 2018-12-15 | Disposition: A | Discharge status: Home / Self Care | Payer: Medicare Other | Source: Ambulatory Visit | Attending: Oncology | Admitting: Oncology

## 2018-12-15 ENCOUNTER — Encounter (HOSPITAL_COMMUNITY)
Admission: RE | Admit: 2018-12-15 | Discharge: 2018-12-15 | Disposition: A | Discharge status: Home / Self Care | Payer: Medicare Other | Source: Ambulatory Visit | Attending: Oncology | Admitting: Oncology

## 2018-12-15 DIAGNOSIS — C7951 Secondary malignant neoplasm of bone: Secondary | ICD-10-CM | POA: Insufficient documentation

## 2018-12-15 DIAGNOSIS — I10 Essential (primary) hypertension: Secondary | ICD-10-CM | POA: Diagnosis not present

## 2018-12-15 DIAGNOSIS — C61 Malignant neoplasm of prostate: Secondary | ICD-10-CM | POA: Insufficient documentation

## 2018-12-15 DIAGNOSIS — N281 Cyst of kidney, acquired: Secondary | ICD-10-CM | POA: Diagnosis not present

## 2018-12-15 DIAGNOSIS — Z923 Personal history of irradiation: Secondary | ICD-10-CM | POA: Diagnosis not present

## 2018-12-15 DIAGNOSIS — Z79899 Other long term (current) drug therapy: Secondary | ICD-10-CM | POA: Insufficient documentation

## 2018-12-15 LAB — CMP (CANCER CENTER ONLY)
ALT: 17 U/L (ref 0–44)
ANION GAP: 11 (ref 5–15)
AST: 27 U/L (ref 15–41)
Albumin: 3.6 g/dL (ref 3.5–5.0)
Alkaline Phosphatase: 97 U/L (ref 38–126)
BUN: 11 mg/dL (ref 8–23)
CHLORIDE: 104 mmol/L (ref 98–111)
CO2: 28 mmol/L (ref 22–32)
CREATININE: 0.83 mg/dL (ref 0.61–1.24)
Calcium: 8.4 mg/dL — ABNORMAL LOW (ref 8.9–10.3)
Glucose, Bld: 96 mg/dL (ref 70–99)
POTASSIUM: 3.8 mmol/L (ref 3.5–5.1)
SODIUM: 143 mmol/L (ref 135–145)
Total Bilirubin: 0.8 mg/dL (ref 0.3–1.2)
Total Protein: 6.9 g/dL (ref 6.5–8.1)

## 2018-12-15 LAB — CBC WITH DIFFERENTIAL (CANCER CENTER ONLY)
Abs Immature Granulocytes: 0.01 10*3/uL (ref 0.00–0.07)
BASOS PCT: 1 %
Basophils Absolute: 0 10*3/uL (ref 0.0–0.1)
EOS PCT: 0 %
Eosinophils Absolute: 0 10*3/uL (ref 0.0–0.5)
HCT: 37.4 % — ABNORMAL LOW (ref 39.0–52.0)
Hemoglobin: 12.3 g/dL — ABNORMAL LOW (ref 13.0–17.0)
Immature Granulocytes: 0 %
Lymphocytes Relative: 42 %
Lymphs Abs: 1.6 10*3/uL (ref 0.7–4.0)
MCH: 29.6 pg (ref 26.0–34.0)
MCHC: 32.9 g/dL (ref 30.0–36.0)
MCV: 90.1 fL (ref 80.0–100.0)
MONO ABS: 0.4 10*3/uL (ref 0.1–1.0)
MONOS PCT: 11 %
Neutro Abs: 1.8 10*3/uL (ref 1.7–7.7)
Neutrophils Relative %: 46 %
PLATELETS: 227 10*3/uL (ref 150–400)
RBC: 4.15 MIL/uL — AB (ref 4.22–5.81)
RDW: 13.1 % (ref 11.5–15.5)
WBC: 3.9 10*3/uL — AB (ref 4.0–10.5)
nRBC: 0 % (ref 0.0–0.2)

## 2018-12-15 MED ORDER — IOHEXOL 300 MG/ML  SOLN
100.0000 mL | Freq: Once | INTRAMUSCULAR | Status: AC | PRN
  Administered 2018-12-15: 100 mL via INTRAVENOUS

## 2018-12-15 MED ORDER — TECHNETIUM TC 99M MEDRONATE IV KIT
20.0000 | PACK | Freq: Once | INTRAVENOUS | Status: AC | PRN
  Administered 2018-12-15: 21.2 via INTRAVENOUS

## 2018-12-15 MED ORDER — SODIUM CHLORIDE (PF) 0.9 % IJ SOLN
INTRAMUSCULAR | Status: AC
  Filled 2018-12-15: qty 50

## 2018-12-16 ENCOUNTER — Inpatient Hospital Stay (HOSPITAL_BASED_OUTPATIENT_CLINIC_OR_DEPARTMENT_OTHER): Payer: Medicare Other | Admitting: Oncology

## 2018-12-16 ENCOUNTER — Telehealth: Payer: Self-pay | Admitting: Pharmacist

## 2018-12-16 VITALS — BP 158/90 | HR 71 | Temp 98.5°F | Resp 17 | Ht 68.5 in | Wt 156.5 lb

## 2018-12-16 DIAGNOSIS — C61 Malignant neoplasm of prostate: Secondary | ICD-10-CM

## 2018-12-16 DIAGNOSIS — E291 Testicular hypofunction: Secondary | ICD-10-CM | POA: Diagnosis not present

## 2018-12-16 DIAGNOSIS — I10 Essential (primary) hypertension: Secondary | ICD-10-CM | POA: Diagnosis not present

## 2018-12-16 DIAGNOSIS — Z79899 Other long term (current) drug therapy: Secondary | ICD-10-CM

## 2018-12-16 DIAGNOSIS — Z923 Personal history of irradiation: Secondary | ICD-10-CM

## 2018-12-16 DIAGNOSIS — C7951 Secondary malignant neoplasm of bone: Secondary | ICD-10-CM

## 2018-12-16 LAB — PROSTATE-SPECIFIC AG, SERUM (LABCORP): PROSTATE SPECIFIC AG, SERUM: 75.6 ng/mL — AB (ref 0.0–4.0)

## 2018-12-16 MED ORDER — ENZALUTAMIDE 40 MG PO CAPS: 160.0000 mg | ORAL_CAPSULE | Freq: Every day | ORAL | 0 refills | Status: DC

## 2018-12-16 NOTE — Progress Notes (Signed)
Hematology and Oncology Follow Up Visit  Austin Oconnor [id number] [birth year] 78 y.o. 12/16/2018 8:29 AM Austin Oconnor, MDVaradarajan, Oconnor,*   Principle Diagnosis: 78 year old man with castration-resistant prostate cancer with disease to the bone diagnosed in 2017.  He presented with Gleason score of 4+5 = 9 in  2015.  Prior Therapy:  He is status post radiation therapy to the area under the care of Austin Oconnor between 02/19/2016 and 03/01/2016.   He was treated with Lupron under the care of Austin Oconnor every 4 months.   His PSA was up to 0.49 in November 2017 and in February 2018 was 2.07 his testosterone was adequately castrate at that time.  Indicating castration resistant disease.  Current therapy: [name] 1000 mg daily with prednisone 5 mg daily started in March 2018.  Interim History: Austin Oconnor is here for a repeat evaluation.  Since the last visit, he reports no major changes in his health.  He denies any decline in his performance status or worsening bone pain.  He does report periodic headaches but no changes in his mentation or ability to perform activities of daily living.  His appetite is reasonable and quality of life is not changed.  Denies any pathological fractures or hospitalizations.  He denies any recent infections or flu symptoms.   Patient denied any alteration mental status, neuropathy, confusion or dizziness.  Denies any headaches or lethargy.  Denies any night sweats, weight loss or changes in appetite.  Denied orthopnea, dyspnea on exertion or chest discomfort.  Denies shortness of breath, difficulty breathing hemoptysis or cough.  Denies any abdominal distention, nausea, early satiety or dyspepsia.  Denies any hematuria, frequency, dysuria or nocturia.  Denies any skin irritation, dryness or rash.  Denies any ecchymosis or petechiae.  Denies any lymphadenopathy or clotting.  Denies any heat or cold intolerance.  Denies any anxiety or depression.   Remaining review of system is negative.     Medications: I have reviewed the patient's current medications.  Current Outpatient Medications  Medication Sig Dispense Refill  . abiraterone acetate ([name]) 250 MG tablet Take 4 tablets (1,000 mg total) by mouth daily. Take on an empty stomach 1 hour before or 2 hours after a meal 120 tablet 0  . Cholecalciferol (VITAMIN D3) 1000 UNITS CAPS Take 1 capsule by mouth daily.    Marland Kitchen lisinopril (PRINIVIL,ZESTRIL) 10 MG tablet Take 1 tablet by mouth daily.    . Multiple Vitamin (MULTIVITAMIN) tablet Take 1 tablet by mouth daily.    . predniSONE (DELTASONE) 5 MG tablet TAKE 1 TABLET (5 MG TOTAL) BY MOUTH DAILY WITH BREAKFAST. 90 tablet 1  . vitamin B-12 (CYANOCOBALAMIN) 1000 MCG tablet Take 1,000 mcg by mouth daily.     No current facility-administered medications for this visit.      Allergies: No Known Allergies  Past Medical History, Surgical history, Social history, and Family History were reviewed and updated.   Physical Exam:   Blood pressure (!) 158/90, pulse 71, temperature 98.5 F (36.9 C), temperature source Oral, resp. rate 17, height 5' 8.5" (1.74 m), weight 156 lb 8 oz (71 kg), SpO2 100 %.    ECOG: 0     General appearance: Alert, awake without any distress. Head: Atraumatic without abnormalities Oropharynx: Without any thrush or ulcers. Eyes: No scleral icterus. Lymph nodes: No lymphadenopathy noted in the cervical, supraclavicular, or axillary nodes Heart:regular rate and rhythm, without any murmurs or gallops.   Lung: Clear to auscultation without any rhonchi, wheezes or dullness  to percussion. Abdomin: Soft, nontender without any shifting dullness or ascites. Musculoskeletal: No clubbing or cyanosis. Neurological: No motor or sensory deficits. Skin: No rashes or lesions. Psychiatric: Mood and affect appeared normal.    Lab Results: Lab Results  Component Value Date   WBC 3.9 (L) 12/15/2018   HGB 12.3 (L)  12/15/2018   HCT 37.4 (L) 12/15/2018   MCV 90.1 12/15/2018   PLT 227 12/15/2018     Chemistry      Component Value Date/Time   NA 143 12/15/2018 0835   NA 143 09/16/2017 0805   K 3.8 12/15/2018 0835   K 3.4 (L) 09/16/2017 0805   CL 104 12/15/2018 0835   CO2 28 12/15/2018 0835   CO2 29 09/16/2017 0805   BUN 11 12/15/2018 0835   BUN 9.1 09/16/2017 0805   CREATININE 0.83 12/15/2018 0835   CREATININE 0.8 09/16/2017 0805      Component Value Date/Time   CALCIUM 8.4 (L) 12/15/2018 0835   CALCIUM 8.4 09/16/2017 0805   ALKPHOS 97 12/15/2018 0835   ALKPHOS 53 09/16/2017 0805   AST 27 12/15/2018 0835   AST 26 09/16/2017 0805   ALT 17 12/15/2018 0835   ALT 19 09/16/2017 0805   BILITOT 0.8 12/15/2018 0835   BILITOT 0.64 09/16/2017 0805       Results for Austin Oconnor (MRN [id number]) as of 12/16/2018 08:31  Ref. Range 09/15/2018 08:53 12/15/2018 08:35  Prostate Specific Ag, Serum Latest Ref Range: 0.0 - 4.0 ng/mL 14.0 (H) 75.6 (H)    EXAM: CT ABDOMEN AND PELVIS WITH CONTRAST  TECHNIQUE: Multidetector CT imaging of the abdomen and pelvis was performed using the standard protocol following bolus administration of intravenous contrast.  CONTRAST:  184mL OMNIPAQUE IOHEXOL 300 MG/ML  SOLN  COMPARISON:  None.  FINDINGS: Lower chest: Unremarkable  Hepatobiliary: No suspicious focal abnormality within the liver parenchyma. There is no evidence for gallstones, gallbladder wall thickening, or pericholecystic fluid. No intrahepatic or extrahepatic biliary dilation.  Pancreas: No focal mass lesion. No dilatation of the main duct. No intraparenchymal cyst. No peripancreatic edema.  Spleen: No splenomegaly. No focal mass lesion.  Adrenals/Urinary Tract: No adrenal nodule or mass. Multiple cysts are seen in the right kidney measuring up to 4.8 cm. Exophytic 19 mm cystic lesion posterior right kidney has attenuation higher than would be expected for a simple cyst is likely  complicated by proteinaceous debris or hemorrhage. Small cysts noted in the left kidney. No evidence for hydroureter. The urinary bladder appears normal for the degree of distention.  Stomach/Bowel: Stomach is unremarkable. No gastric wall thickening. No evidence of outlet obstruction. Duodenum is normally positioned as is the ligament of Treitz. No small bowel wall thickening. No small bowel dilatation. The terminal ileum is normal. The appendix is normal. No gross colonic mass. No colonic wall thickening.  Vascular/Lymphatic: No abdominal aortic aneurysm. No abdominal aortic atherosclerotic calcification. There is no gastrohepatic or hepatoduodenal ligament lymphadenopathy. No intraperitoneal or retroperitoneal lymphadenopathy. No pelvic sidewall lymphadenopathy.  Reproductive: The prostate gland is unenlarged.  Other: No intraperitoneal free fluid.  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 Small sclerotic foci are noted in the lumbar spine.  IMPRESSION: 1. No evidence for soft tissue metastases in the abdomen or pelvis. 2. Bilateral renal cysts including a 19 mm cystic lesion in the posterior right kidney which has attenuation higher than would be expected for a simple cyst. This may be a  cyst complicated by proteinaceous debris or hemorrhage. Attention on follow-up recommended. 3. Sclerotic bone metastases is documented on prior bone scans and MRI.      Impression and Plan:  78 year old man with:  1.  Advanced prostate cancer with disease at the bone that is currently castration-resistant documented in 2017.  He remains on [name] without any major complications although his PSA has been rising.  His most recent PSA was up to 75.6 with imaging studies showed progression of disease with bone involvement.  Treatment options were reviewed today which includes discontinuation of [name] and  switching to different salvage therapies.  The salvage therapies include Xtandi, systemic chemotherapy or Xofigo.  Risks and benefits of all these approaches were debated.  After discussion today, he opted to proceed with Xtandi.  It would be reasonable option to try temporarily but he understands if he becomes symptomatic systemic chemotherapy need to be started at that time.  Complication associated with this medication include fatigue, edema, hematuria and rarely seizures.  2. Androgen deprivation: He remains on Lupron which she takes regularly under the care of Dr. Diona Fanti.  I recommended continuing that indefinitely.  3. Bone directed therapy: No issues reported with Xgeva at this time.  Long-term complications were reiterated including hypocalcemia and osteonecrosis of the jaw.  He is at high risk of developing skeletal related events and I recommended continuing Xgeva.  4.  Hypertension: Blood pressure mildly elevated but reasonably controlled on [name].  We need to continue to monitor on Xtandi as well.  5.  Goals of care and prognosis: Therapy remains palliative although his performance status is reasonable and aggressive therapy is warranted.  6. Follow-up: Will be in 4 weeks to follow his progress.  25  minutes was spent with the patient face-to-face today.  More than 50% of time was dedicated to discussing laboratory data, imaging studies, options of therapy and complications related to these treatments.    Zola Button, MD 2/5/20208:29 AM

## 2018-12-16 NOTE — Telephone Encounter (Addendum)
Oral Oncology Pharmacist Encounter  Received new prescription for Xtandi (enzalutamide) for the treatment of metastatic, castration resistant prostate cancer in conjunction with androgen deprivation therapy (Lupron injections), planned duration until disease progression or unacceptable toxicity.  Original diagnosis in November 2015, was found to have bone metastases at diagnosis. Patient originally refused androgen deprivation therapy at that time He received radiation to his site of bone metastasis 02/19/2016- 03/01/2016 And started on androgen deprivation therapy with Lupron injections at that time  Patient then experienced castration resistant prostate cancer and additional hormonal therapy with Fabio Asa was added in March 2018 Patient now with biochemical relapse as well as disease progression noted on imaging, and is under evaluation to start a different salvage therapy for his castration resistant disease with Xtandi.  Labs from 12/15/2018 assessed, okay for treatment. BPs in Epic reviewed, most readings above normal limits, will continue to be monitored  Current medication list in Epic reviewed, no DDIs with Xtandi identified.  Prescription has been e-scribed to the [facility] for benefits analysis and approval.  Oral Oncology Clinic will continue to follow for insurance authorization, copayment issues, initial counseling and start date.  Johny Drilling, PharmD, BCPS, BCOP  12/16/2018 4:17 PM Oral Oncology Clinic [phone]

## 2018-12-17 NOTE — Telephone Encounter (Signed)
Oral Chemotherapy Pharmacist Encounter   I spoke with patient for overview of: Xtandi (enzalutamide) for the treatment of metastatic, castration resistant prostate cancer in conjunction with androgen deprivation therapy (Lupron injections), planned duration until disease progression or unacceptable toxicity.   Counseled patient on administration, dosing, side effects, monitoring, drug-food interactions, safe handling, storage, and disposal.  Patient will take Xtandi 40mg  capsules, 4 capsules (160mg ) by mouth once daily without regard to food. Patient plans to take his Xtandi when he wakes up at 5 AM each morning, this is when he was taking his [name]. He is happy to hear that he does not have to wait an hour before eating with new medication change.  Xtandi start date: 12/19/2018  Adverse effects include but are not limited to: peripheral edema, GI upset, hypertension, hot flashes, fatigue, falls/fractures, and arthralgias.   Patient instructed about small risk of seizures with Xtandi treatment.  Reviewed with patient importance of keeping a medication schedule and plan for any missed doses.  Mr. Bolle voiced understanding and appreciation.   All questions answered. Medication reconciliation performed and medication/allergy list updated.  Insurance authorization for Gillermina Phy was not required.  Test claim at the pharmacy revealed copayment [phone] Patient already has foundation copayment grant secured to cover out-of-pocket costs for [name], this will be used to cover out-of-pocket cost for [place].  Patient plans to pick up his first bottle of Xtandi from the [facility] long outpatient pharmacy tomorrow, 12/18/2018, for $0 out-of-pocket cost to patient.  He will start his Xtandi on 12/19/2018. Patient states he has already discontinued use of [name] and prednisone.  Patient knows to call the office with questions or concerns.  Oral Oncology Clinic will continue to follow.  Johny Drilling, PharmD,  BCPS, BCOP  12/17/2018   12:07 PM Oral Oncology Clinic [phone]

## 2018-12-18 MED FILL — XTANDI 40 MG CAPSULE: 40 | 30 days supply | Qty: 120 | Fill #0

## 2018-12-19 ENCOUNTER — Other Ambulatory Visit: Payer: Self-pay

## 2018-12-19 ENCOUNTER — Emergency Department (HOSPITAL_COMMUNITY)
Admission: EM | Admit: 2018-12-19 | Discharge: 2018-12-19 | Disposition: A | Discharge status: Home / Self Care | Payer: Medicare Other | Attending: Emergency Medicine | Admitting: Emergency Medicine

## 2018-12-19 ENCOUNTER — Emergency Department (HOSPITAL_COMMUNITY): Payer: Medicare Other

## 2018-12-19 DIAGNOSIS — I10 Essential (primary) hypertension: Secondary | ICD-10-CM | POA: Insufficient documentation

## 2018-12-19 DIAGNOSIS — R42 Dizziness and giddiness: Secondary | ICD-10-CM | POA: Diagnosis not present

## 2018-12-19 DIAGNOSIS — Z8546 Personal history of malignant neoplasm of prostate: Secondary | ICD-10-CM | POA: Insufficient documentation

## 2018-12-19 DIAGNOSIS — Z79899 Other long term (current) drug therapy: Secondary | ICD-10-CM | POA: Insufficient documentation

## 2018-12-19 DIAGNOSIS — T50995A Adverse effect of other drugs, medicaments and biological substances, initial encounter: Secondary | ICD-10-CM | POA: Diagnosis not present

## 2018-12-19 DIAGNOSIS — T887XXA Unspecified adverse effect of drug or medicament, initial encounter: Secondary | ICD-10-CM

## 2018-12-19 LAB — BASIC METABOLIC PANEL
Anion gap: 13 (ref 5–15)
BUN: 17 mg/dL (ref 8–23)
CO2: 23 mmol/L (ref 22–32)
Calcium: 7.7 mg/dL — ABNORMAL LOW (ref 8.9–10.3)
Chloride: 101 mmol/L (ref 98–111)
Creatinine, Ser: 1.11 mg/dL (ref 0.61–1.24)
GFR calc Af Amer: >60 mL/min (ref >60)
Glucose, Bld: 156 mg/dL — ABNORMAL HIGH (ref 70–99)
POTASSIUM: 3.2 mmol/L — AB (ref 3.5–5.1)
Sodium: 137 mmol/L (ref 135–145)

## 2018-12-19 LAB — CBC WITH DIFFERENTIAL/PLATELET
Abs Immature Granulocytes: 0.02 10*3/uL (ref 0.00–0.07)
Basophils Absolute: 0 10*3/uL (ref 0.0–0.1)
Basophils Relative: 0 %
Eosinophils Absolute: 0 10*3/uL (ref 0.0–0.5)
Eosinophils Relative: 0 %
HCT: 40.4 % (ref 39.0–52.0)
Hemoglobin: 13.2 g/dL (ref 13.0–17.0)
Immature Granulocytes: 0 %
Lymphocytes Relative: 26 %
Lymphs Abs: 1.4 10*3/uL (ref 0.7–4.0)
MCH: 29.2 pg (ref 26.0–34.0)
MCHC: 32.7 g/dL (ref 30.0–36.0)
MCV: 89.4 fL (ref 80.0–100.0)
Monocytes Absolute: 0.4 10*3/uL (ref 0.1–1.0)
Monocytes Relative: 7 %
Neutro Abs: 3.5 10*3/uL (ref 1.7–7.7)
Neutrophils Relative %: 67 %
Platelets: 241 10*3/uL (ref 150–400)
RBC: 4.52 MIL/uL (ref 4.22–5.81)
RDW: 12.9 % (ref 11.5–15.5)
WBC: 5.3 10*3/uL (ref 4.0–10.5)
nRBC: 0 % (ref 0.0–0.2)

## 2018-12-19 LAB — I-STAT TROPONIN, ED: Troponin i, poc: 0.01 ng/mL (ref 0.00–0.08)

## 2018-12-19 MED ORDER — SODIUM CHLORIDE 0.9 % IV BOLUS
500.0000 mL | Freq: Once | INTRAVENOUS | Status: AC
  Administered 2018-12-19: 500 mL via INTRAVENOUS

## 2018-12-19 NOTE — ED Provider Notes (Signed)
[facility] EMERGENCY DEPARTMENT Provider Note   CSN: [id number] Arrival date & time: 12/19/18  1626     History   Chief Complaint Chief Complaint  Patient presents with  . Loss of Consciousness    HPI Austin Oconnor is a 78 y.o. male.  78 year old male with medical history as detailed below presents for evaluation of episodes of lightheadedness.  Patient reports 2 episodes of feeling lightheaded.  Once was this morning.  Another was this afternoon at [facility].  He suspects that a new medication that he started this morning may be responsible for his symptoms.  The new medication is Xtandi.  He was advised by the pharmacist that he may have similar symptoms as a possible side effect.  The 2 episodes lasted approximately 30 minutes.  He denies associated complications or chest pain.  He denies actual syncope.  He denies associated chest pain, shortness of breath, nausea, vomiting, or other acute complaint.  He is currently without symptoms.  The history is provided by the patient and medical records.  Illness  Location:  Transient episodes of lightheadedness Severity:  Mild Onset quality:  Sudden Duration:  30 minutes Timing:  Rare Progression:  Resolved Chronicity:  New   Past Medical History:  Diagnosis Date  . At risk for sleep apnea    STOP-BANG= 4      . Elevated PSA   . Hypertension   . met prostate ca dx'd 2015  . Nocturia   . Wears partial dentures     Patient Active Problem List   Diagnosis Date Noted  . Malignant neoplasm of prostate ([place]) 02/05/2016  . 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 INGUINAL HERNIA REPAIR Bilateral 1990  &  1960s  . LUMBAR SPINE SURGERY  age 53's  . PROSTATE BIOPSY N/A 09/16/2014   Procedure: BIOPSY TRANSRECTAL ULTRASONIC PROSTATE (TUBP);  Surgeon: Austin Persons, MD;  Location: [facility];  Service: Urology;  Laterality: N/A;        Home  Medications    Prior to Admission medications   Medication Sig Start Date End Date Taking? Authorizing Provider  Cholecalciferol (VITAMIN D3) 1000 UNITS CAPS Take 1 capsule by mouth daily.    [provider]  enzalutamide Gillermina Phy) 40 MG capsule Take 4 capsules (160 mg total) by mouth daily. 12/16/18   Austin Portela, MD  lisinopril (PRINIVIL,ZESTRIL) 10 MG tablet Take 1 tablet by mouth daily. 01/08/17   [provider]  Multiple Vitamin (MULTIVITAMIN) tablet Take 1 tablet by mouth daily.    [provider]  vitamin B-12 (CYANOCOBALAMIN) 1000 MCG tablet Take 1,000 mcg by mouth daily.    [provider]    Family History Family History  Problem Relation Age of Onset  . Cancer Neg Hx     Social History Social History   Tobacco Use  . Smoking status: Never Smoker  . Smokeless tobacco: Never Used  Substance Use Topics  . Alcohol use: No  . Drug use: No     Allergies   Patient has no known allergies.   Review of Systems Review of Systems  All other systems reviewed and are negative.    Physical Exam Updated Vital Signs BP (!) 154/90 (BP Location: Right Arm)   Pulse 74   Temp (!) 97.5 F (36.4 C) (Oral)   Resp 18   Ht 5' 8" (1.727 m)   Wt 71 kg  SpO2 98%   BMI 23.79 kg/m   Physical Exam Vitals signs and nursing note reviewed.  Constitutional:      General: He is not in acute distress.    Appearance: He is well-developed.  HENT:     Head: Normocephalic and atraumatic.  Eyes:     Conjunctiva/sclera: Conjunctivae normal.     Pupils: Pupils are equal, round, and reactive to light.  Neck:     Musculoskeletal: Normal range of motion and neck supple.  Cardiovascular:     Rate and Rhythm: Normal rate and regular rhythm.     Heart sounds: Normal heart sounds.  Pulmonary:     Effort: Pulmonary effort is normal. No respiratory distress.     Breath sounds: Normal breath sounds.  Abdominal:     General: There is no distension.      Palpations: Abdomen is soft.     Tenderness: There is no abdominal tenderness.  Musculoskeletal: Normal range of motion.        General: No deformity.  Skin:    General: Skin is warm and dry.  Neurological:     General: No focal deficit present.     Mental Status: He is alert and oriented to person, place, and time. Mental status is at baseline.     Cranial Nerves: No cranial nerve deficit.     Sensory: No sensory deficit.     Motor: No weakness.     Coordination: Coordination normal.      ED Treatments / Results  Labs (all labs ordered are listed, but only abnormal results are displayed) Labs Reviewed  BASIC METABOLIC PANEL - Abnormal; Notable for the following components:      Result Value   Potassium 3.2 (*)    Glucose, Bld 156 (*)    Calcium 7.7 (*)    All other components within normal limits  CBC WITH DIFFERENTIAL/PLATELET  I-STAT TROPONIN, ED    EKG None  Radiology Dg Chest Port 1 View  Result Date: 12/19/2018 CLINICAL DATA:  Syncope. EXAM: PORTABLE CHEST 1 VIEW COMPARISON:  None. FINDINGS: The heart size and mediastinal contours are within normal limits. Both lungs are clear. No pneumothorax or pleural effusion is noted. The visualized skeletal structures are unremarkable. IMPRESSION: No active disease. Electronically Signed   By: Austin Oconnor, M.D.   On: 12/19/2018 17:47    Procedures Procedures (including critical care time)  Medications Ordered in ED Medications  sodium chloride 0.9 % bolus 500 mL (has no administration in time range)     Initial Impression / Assessment and Plan / ED Course  I have reviewed the triage vital signs and the nursing notes.  Pertinent labs & imaging results that were available during my care of the patient were reviewed by me and considered in my medical decision making (see chart for details).     MDM  Screen complete  Patient is presenting for episodes of transient lightheadedness.  This happened concurrently with  initiation of Xtandi use.  Per pharmacy note the symptoms may be associated with Xtandi use.  Patient without evidence of other significant acute pathology.  Patient is asymptomatic during his ED evaluation.  Following his ED evaluation he needs further evaluation.  He desires discharge.  He does understand the need for close follow-up.  Strict precautions given and understood.  He will discuss with his regular care providers as to whether he should continue the use of Xtandi on Monday. Final Clinical Impressions(s) / ED Diagnoses  Final diagnoses:  Medication side effect    ED Discharge Orders    None       Valarie Merino, MD 12/19/18 [phone]

## 2018-12-19 NOTE — ED Triage Notes (Signed)
Pt brought in by ems for c/o syncopal episode that occurred while he was at [facility] today ; pt states right before that he became nauseated ; upon ems arrival; pt was alert and oriented x 4 ; denies any chest pain or sob ; ems also reports patient was orthostatic upon arrival

## 2018-12-19 NOTE — Discharge Instructions (Signed)
Return for any problem.  Follow-up with your regular care providers.  Discuss your new medication Xtandi with your regular care providers.

## 2018-12-21 NOTE — Telephone Encounter (Signed)
Oral Oncology Patient Advocate Encounter  Confirmed with [name] that Austin Oconnor was picked up on 12/18/18 with a $0 copay using [place].   [place] [facility] [name] Phone [phone] Fax [phone]

## 2018-12-22 ENCOUNTER — Telehealth: Payer: Self-pay | Admitting: *Deleted

## 2018-12-22 NOTE — Telephone Encounter (Signed)
Spoke with patient, per dr Alen Blew, hold xtandi medication until next visit.

## 2018-12-22 NOTE — Telephone Encounter (Signed)
Patient calling to say he took one dose of xtandi on 12/20/2018 and felt very weak, and fainted. Went to [facility]. and was given I.V. fluids for dehydration and dc'd. States he has not taken [place] since. States his next appt with dr Alen Blew is not until march.

## 2018-12-22 NOTE — Telephone Encounter (Signed)
Ok to hold till next visit.

## 2019-01-19 ENCOUNTER — Inpatient Hospital Stay: Payer: Medicare Other | Attending: Oncology

## 2019-01-19 DIAGNOSIS — Z923 Personal history of irradiation: Secondary | ICD-10-CM | POA: Diagnosis not present

## 2019-01-19 DIAGNOSIS — C61 Malignant neoplasm of prostate: Secondary | ICD-10-CM | POA: Insufficient documentation

## 2019-01-19 DIAGNOSIS — I10 Essential (primary) hypertension: Secondary | ICD-10-CM | POA: Insufficient documentation

## 2019-01-19 DIAGNOSIS — E291 Testicular hypofunction: Secondary | ICD-10-CM | POA: Insufficient documentation

## 2019-01-19 DIAGNOSIS — C7951 Secondary malignant neoplasm of bone: Secondary | ICD-10-CM | POA: Diagnosis not present

## 2019-01-19 DIAGNOSIS — Z79899 Other long term (current) drug therapy: Secondary | ICD-10-CM | POA: Insufficient documentation

## 2019-01-19 LAB — CBC WITH DIFFERENTIAL (CANCER CENTER ONLY)
ABS IMMATURE GRANULOCYTES: 0.02 10*3/uL (ref 0.00–0.07)
Basophils Absolute: 0 10*3/uL (ref 0.0–0.1)
Basophils Relative: 1 %
Eosinophils Absolute: 0 10*3/uL (ref 0.0–0.5)
Eosinophils Relative: 0 %
HCT: 35.3 % — ABNORMAL LOW (ref 39.0–52.0)
HEMOGLOBIN: 11.3 g/dL — AB (ref 13.0–17.0)
Immature Granulocytes: 1 %
Lymphocytes Relative: 36 %
Lymphs Abs: 1.4 10*3/uL (ref 0.7–4.0)
MCH: 28.8 pg (ref 26.0–34.0)
MCHC: 32 g/dL (ref 30.0–36.0)
MCV: 90.1 fL (ref 80.0–100.0)
Monocytes Absolute: 0.4 10*3/uL (ref 0.1–1.0)
Monocytes Relative: 10 %
NEUTROS ABS: 2 10*3/uL (ref 1.7–7.7)
NEUTROS PCT: 52 %
Platelet Count: 251 10*3/uL (ref 150–400)
RBC: 3.92 MIL/uL — ABNORMAL LOW (ref 4.22–5.81)
RDW: 13.5 % (ref 11.5–15.5)
WBC Count: 3.8 10*3/uL — ABNORMAL LOW (ref 4.0–10.5)
nRBC: 0 % (ref 0.0–0.2)

## 2019-01-19 LAB — CMP (CANCER CENTER ONLY)
ALT: 13 U/L (ref 0–44)
AST: 18 U/L (ref 15–41)
Albumin: 3.3 g/dL — ABNORMAL LOW (ref 3.5–5.0)
Alkaline Phosphatase: 88 U/L (ref 38–126)
Anion gap: 11 (ref 5–15)
BUN: 15 mg/dL (ref 8–23)
CHLORIDE: 104 mmol/L (ref 98–111)
CO2: 27 mmol/L (ref 22–32)
Calcium: 8.3 mg/dL — ABNORMAL LOW (ref 8.9–10.3)
Creatinine: 0.89 mg/dL (ref 0.61–1.24)
GFR, Est AFR Am: >60 mL/min (ref >60)
GFR, Estimated: >60 mL/min (ref >60)
Glucose, Bld: 102 mg/dL — ABNORMAL HIGH (ref 70–99)
Potassium: 4.3 mmol/L (ref 3.5–5.1)
Sodium: 142 mmol/L (ref 135–145)
Total Bilirubin: 0.4 mg/dL (ref 0.3–1.2)
Total Protein: 6.9 g/dL (ref 6.5–8.1)

## 2019-01-20 LAB — PROSTATE-SPECIFIC AG, SERUM (LABCORP): Prostate Specific Ag, Serum: 160 ng/mL — ABNORMAL HIGH (ref 0.0–4.0)

## 2019-01-20 LAB — TESTOSTERONE: Testosterone: <3 ng/dL — ABNORMAL LOW (ref 264–916)

## 2019-01-21 ENCOUNTER — Inpatient Hospital Stay (HOSPITAL_BASED_OUTPATIENT_CLINIC_OR_DEPARTMENT_OTHER): Payer: Medicare Other | Admitting: Oncology

## 2019-01-21 ENCOUNTER — Other Ambulatory Visit: Payer: Self-pay

## 2019-01-21 ENCOUNTER — Telehealth: Payer: Self-pay | Admitting: Oncology

## 2019-01-21 VITALS — BP 153/99 | HR 89 | Temp 98.3°F | Resp 18 | Ht 68.0 in | Wt 159.3 lb

## 2019-01-21 DIAGNOSIS — I10 Essential (primary) hypertension: Secondary | ICD-10-CM

## 2019-01-21 DIAGNOSIS — C7951 Secondary malignant neoplasm of bone: Secondary | ICD-10-CM

## 2019-01-21 DIAGNOSIS — E291 Testicular hypofunction: Secondary | ICD-10-CM | POA: Diagnosis not present

## 2019-01-21 DIAGNOSIS — Z923 Personal history of irradiation: Secondary | ICD-10-CM

## 2019-01-21 DIAGNOSIS — Z79899 Other long term (current) drug therapy: Secondary | ICD-10-CM

## 2019-01-21 DIAGNOSIS — C61 Malignant neoplasm of prostate: Secondary | ICD-10-CM

## 2019-01-21 NOTE — Telephone Encounter (Signed)
Gave avs and calendar ° °

## 2019-01-21 NOTE — Progress Notes (Signed)
Hematology and Oncology Follow Up Visit  Austin Oconnor [id number] August 22, 1941 78 y.o. 01/21/2019 3:05 PM Austin Oconnor, MDVaradarajan, Oconnor,*   Principle Diagnosis: 78 year old man with advanced prostate cancer with disease to the bone diagnosed in 2017.  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   Prior Therapy:  He is status post radiation therapy to the area under the care of Dr. Tammi Oconnor between 02/19/2016 and 03/01/2016.   He was treated with Lupron under the care of Dr. Luberta Oconnor every 4 months.   His PSA was up to 0.49 in November 2017 and in February 2018 was 2.07 his testosterone was adequately castrate at that time.  Indicating castration resistant disease.   [name] 1000 mg daily with prednisone 5 mg daily started in March 2018.  Therapy discontinued in February 2020 due to progression of disease.   Current therapy: Xtandi 160 mg daily started in February 2020.  Therapy discontinued after poor tolerance.  Interim History: Austin Oconnor is here for a follow-up.  Since the last visit, he is started [place] and has tolerated it very poorly.  He started symptoms of dizziness, lightheadedness and weakness.  He was seen in the emergency department on February 8 of 2020 and he was instructed at that time to stop Xtandi.  Since discontinuation of his therapy he has felt better at this time.  He is no longer reporting any lightheadedness but reports his symptoms took a while to resolve.  He denies any bone pain or pathological fractures.  He denies any decline in his appetite.  Continues to exercise regularly.  Patient denied any alteration mental status, neuropathy, confusion or dizziness.  Denies any headaches or lethargy.  Denies any night sweats, weight loss or changes in appetite.  Denied orthopnea, dyspnea on exertion or chest discomfort.  Denies shortness of breath, difficulty breathing hemoptysis or cough.  Denies any  abdominal distention, nausea, early satiety or dyspepsia.  Denies any hematuria, frequency, dysuria or nocturia.  Denies any skin irritation, dryness or rash.  Denies any ecchymosis or petechiae.  Denies any lymphadenopathy or clotting.  Denies any heat or cold intolerance.  Denies any anxiety or depression.  Remaining review of system is negative.        Medications: I have reviewed the patient's current medications.  Current Outpatient Medications  Medication Sig Dispense Refill  . abiraterone acetate ([name]) 250 MG tablet Take 250 mg by mouth daily. Take on an empty stomach 1 hour before or 2 hours after a meal    . Cholecalciferol (VITAMIN D3) 1000 UNITS CAPS Take 1 capsule by mouth daily.    . [employer] ATHLETES FOOT 1 % cream Apply 1 application topically daily as needed.    . [employer] EAR DROPS 6.5 % OTIC solution 5 drops. Into aff ear 2x daily for 10 days    . enzalutamide (XTANDI) 40 MG capsule Take 4 capsules (160 mg total) by mouth daily. 120 capsule 0  . lisinopril (PRINIVIL,ZESTRIL) 10 MG tablet Take 1 tablet by mouth daily.    . Multiple Vitamin (MULTIVITAMIN) tablet Take 1 tablet by mouth daily.    . sildenafil (VIAGRA) 100 MG tablet Take 100 mg by mouth as needed.    . vitamin B-12 (CYANOCOBALAMIN) 1000 MCG tablet Take 1,000 mcg by mouth daily.     No current facility-administered medications for this visit.      Allergies: No Known Allergies  Past Medical History, Surgical history, Social history, and Family  History were reviewed and updated.   Physical Exam:   Blood pressure (!) 153/99, pulse 89, temperature 98.3 F (36.8 C), temperature source Oral, resp. rate 18, height 5\' 8"  (1.727 m), weight 159 lb 4.8 oz (72.3 kg), SpO2 99 %.     ECOG: 0     General appearance: Comfortable appearing without any discomfort Head: Normocephalic without any trauma Oropharynx: Mucous membranes are moist and pink without any thrush or ulcers. Eyes: Pupils are equal and round  reactive to light. Lymph nodes: No cervical, supraclavicular, inguinal or axillary lymphadenopathy.   Heart:regular rate and rhythm.  S1 and S2 without leg edema. Lung: Clear without any rhonchi or wheezes.  No dullness to percussion. Abdomin: Soft, nontender, nondistended with good bowel sounds.  No hepatosplenomegaly. Musculoskeletal: No joint deformity or effusion.  Full range of motion noted. Neurological: No deficits noted on motor, sensory and deep tendon reflex exam. Skin: No petechial rash or dryness.  Appeared moist.      Lab Results: Lab Results  Component Value Date   WBC 3.8 (L) 01/19/2019   HGB 11.3 (L) 01/19/2019   HCT 35.3 (L) 01/19/2019   MCV 90.1 01/19/2019   PLT 251 01/19/2019     Chemistry      Component Value Date/Time   NA 142 01/19/2019 0809   NA 143 09/16/2017 0805   K 4.3 01/19/2019 0809   K 3.4 (L) 09/16/2017 0805   CL 104 01/19/2019 0809   CO2 27 01/19/2019 0809   CO2 29 09/16/2017 0805   BUN 15 01/19/2019 0809   BUN 9.1 09/16/2017 0805   CREATININE 0.89 01/19/2019 0809   CREATININE 0.8 09/16/2017 0805      Component Value Date/Time   CALCIUM 8.3 (L) 01/19/2019 0809   CALCIUM 8.4 09/16/2017 0805   ALKPHOS 88 01/19/2019 0809   ALKPHOS 53 09/16/2017 0805   AST 18 01/19/2019 0809   AST 26 09/16/2017 0805   ALT 13 01/19/2019 0809   ALT 19 09/16/2017 0805   BILITOT 0.4 01/19/2019 0809   BILITOT 0.64 09/16/2017 0805       Results for Austin Oconnor (MRN [id number]) as of 01/21/2019 15:59  Ref. Range 12/15/2018 08:35 01/19/2019 08:09  Prostate Specific Ag, Serum Latest Ref Range: 0.0 - 4.0 ng/mL 75.6 (H) 160.0 (H)      Impression and Plan:  78 year old man with:  1.  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  He is status post therapy as outlined above and currently has progressive disease.   He has progressed on [name] and was intolerant to [place].  After taking Xtandi for few weeks had  significant toxicities including lightheadedness, weakness and presyncopal episode.  His PSA continues to rise indicating progression of disease that is predominantly in the bone.  Treatment options were reiterated today which include restarting Xtandi at a lower dose, systemic chemotherapy or Xofigo.  Risks and benefits of all these approaches were reviewed today and given the fact that he has predominantly disease in the bone I have recommended proceeding with Xofigo at this time.  Systemic chemotherapy will be his next option.  2. Androgen deprivation: I recommended continuing Lupron at this time which she is currently receiving under the care of Dr. Diona Fanti.  3. Bone directed therapy: I will resume Xgeva under the care of Dr. Lu Duffel which I recommended he continues.  4.  Hypertension: His blood pressure has been mildly elevated although overall manageable between visits.  Anticipate improvement  off [place].  5.  Goals of care and prognosis: Treatment remains palliative at this time although aggressive therapy is warranted given his excellent performance status.  6. Follow-up: Will be in 3 months to follow his progress on Xofigo.  25  minutes was spent with the patient face-to-face today.  More than 50% of time was dedicated to reviewing his disease status, treatment options and answering questions regarding future plan of care.    Zola Button, MD 3/12/20203:05 PM

## 2019-02-15 NOTE — Progress Notes (Signed)
Histology and Location of Primary Cancer: advanced prostate cancer with disease to the bone diagnosed in 2017.  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   Sites of Visceral and Bony Metastatic Disease:  Widespread osseous metastatic disease markedly progressive since prior study.This includes progressive sites of uptake at the BILATERAL proximal femora and suspect the LEFT humerus.  Location(s) of Symptomatic Metastases: Reports intermittent left hip pain that doesn't require intervention.   Past/Anticipated chemotherapy by medical oncology, if any:  Prior Therapy:  He is status post radiation therapy to the area under the care of Dr. Manning between 02/19/2016 and 03/01/2016.   He was treated with Lupron under the care of Dr. Dahlsteadt every 4 months.   His PSA was up to 0.49 in November 2017 and in February 2018 was 2.07 his testosterone was adequately castrate at that time.  Indicating castration resistant disease.   [name] 1000 mg daily with prednisone 5 mg daily started in March 2018.  Therapy discontinued in February 2020 due to progression of disease.   Current therapy: Xtandi 160 mg daily started in February 2020.  Therapy discontinued after poor tolerance.   Pain on a scale of 0-10 is: Reports intermittent left hip pain that doesn't require intervention.      If Spine Met(s), symptoms, if any, include:  Bowel/Bladder retention or incontinence (please describe): Denies bladder complaints. Reports drinking prune juice often to avoid constipation.   Numbness or weakness in extremities (please describe): Reports rare numbness in left leg. Denies numbness or tingling in arms.  Current Decadron regimen, if applicable: Denies  Ambulatory status? Walker? Wheelchair?: Ambulatory  SAFETY ISSUES:  Prior radiation? Yes.   Pacemaker/ICD? No  Possible current pregnancy? no, male patient  Is the patient on  methotrexate? No  Current Complaints / other details:  78 year old male. NKDA. Receives Lupron every six months. Reports his next appointment with Dahlstedt is in June and he believe he will receive another Lupron injection then.      

## 2019-02-16 ENCOUNTER — Ambulatory Visit
Admission: RE | Admit: 2019-02-16 | Discharge: 2019-02-16 | Disposition: A | Discharge status: Home / Self Care | Payer: Medicare Other | Source: Ambulatory Visit | Attending: Radiation Oncology | Admitting: Radiation Oncology

## 2019-02-16 ENCOUNTER — Encounter: Payer: Self-pay | Admitting: Radiation Oncology

## 2019-02-16 ENCOUNTER — Other Ambulatory Visit: Payer: Self-pay

## 2019-02-16 VITALS — Ht 68.0 in | Wt 159.0 lb

## 2019-02-16 DIAGNOSIS — C7952 Secondary malignant neoplasm of bone marrow: Secondary | ICD-10-CM

## 2019-02-16 DIAGNOSIS — C7951 Secondary malignant neoplasm of bone: Secondary | ICD-10-CM

## 2019-02-16 DIAGNOSIS — C61 Malignant neoplasm of prostate: Secondary | ICD-10-CM

## 2019-02-16 HISTORY — DX: Malignant neoplasm of prostate: C61

## 2019-02-16 NOTE — Progress Notes (Signed)
Radiation Oncology         (336) [phone] ________________________________  Initial Outpatient Consultation - Conducted via telephone due to current COVID-19 concerns for limiting patient exposure  Name: Austin Oconnor MRN: [id number]  Date: 02/16/2019  DOB: 1941-03-13  IR:[id number], Ronie Spies, MD  Wyatt Portela, MD   REFERRING PHYSICIAN: Wyatt Portela, MD  DIAGNOSIS: 78 y.o. 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     ICD-10-CM   1. Secondary malignant neoplasm of bone and bone marrow (HCC) C79.51    C79.52     HISTORY OF PRESENT ILLNESS: Austin Oconnor is a 78 y.o. male with a diagnosis of advanced, castration-resistant prostate cancer. He was initially diagnosed with Gleason 4+5 metastatic adenocarcinoma of the prostate with PSA of 15.01 in September 2015 with Dr. Lowella Bandy. At the time of diagnosis, a bone scan on 09/30/2014 revealed evidence of osseous metastatic disease in the intertrochanteric region of the left femur and an MRI of the pelvis on 10/24/2014 confirmed metastatic involvement in the left intertrochanteric femur and multiple other metastases within the pelvis. The patient started ADT on 11/28/2014 with excellent response and PSA nadir down to 0.13 in July 2017. Delton See was started on 06/06/2015. 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 The patient initially presented to our clinic in March 2017 for consideration of palliative radiation treatment to the left hip to reduce fracture risk and this was completed in April 2017 and was well tolerated.  [name] was added to his ADT treatment in 01/2017 due to rising PSA, up to 2.07 in February 2018, despite castrate levels of testoterone. His PSA dropped to 0.3 in April 2018 and then remained <0.1 until May 2019, when it jumped back up to 0.5. Unfortunately, the PSA  continued to rise and reached 75.6 in February 2020. A restaging CT abdomen/pelvis did not show any evidence of visceral metastases. However, bone scan on 12/15/2018 confirmed widespread, osseous metastatic disease, markedly progressive since prior stable exams. Metastatic sites include the sternum, bilateral scapula, bilateral anterior and posterior ribs, thoracic and lumbar spine, pelvis, bilateral proximal femora, and suspect the left humerus. His [name] was discontinued at that time due to progression of disease and he was switched to [facility], but this was also discontinued after one treatment due to poor tolerance. His PSA has continued to rise from 103.0 on 01/13/2019, to his most recent, 160.0 on 01/19/2019.  He has been referred back to our clinic today to discuss the potential option of adding Xofigo to his treatment regimen, in addition to continuing the Lupron ADT every 6 months.   PREVIOUS RADIATION THERAPY: Yes   02/19/2016 to 03/01/2016:  The Left femur bone met was treated to 30 Gy in 10 fractions at 3 Gy per fraction.  PAST MEDICAL HISTORY:  Past Medical History:  Diagnosis Date  . At risk for sleep apnea    STOP-BANG= 4      . Elevated PSA   . Hypertension   . Nocturia   . Prostate cancer ([place])   . Wears partial dentures       PAST SURGICAL HISTORY: Past Surgical History:  Procedure Laterality Date  . INGUINAL HERNIA REPAIR Bilateral 1990  &  1960s  . LUMBAR SPINE SURGERY  age 38's  . PROSTATE BIOPSY N/A 09/16/2014   Procedure: BIOPSY TRANSRECTAL ULTRASONIC PROSTATE (TUBP);  Surgeon: Arvil Persons, MD;  Location: [facility];  Service: Urology;  Laterality: N/A;    FAMILY HISTORY:  Family History  Problem Relation Age of Onset  . Prostate cancer Brother   . Cancer Neg Hx   . Breast cancer Neg Hx   . Colon cancer Neg Hx   . Pancreatic cancer Neg Hx     SOCIAL HISTORY:  Social History   Socioeconomic History  . Marital status: Widowed    Spouse name: Not  on file  . Number of children: 3  . Years of education: Not on file  . Highest education level: Not on file  Occupational History  . Not on file  Social Needs  . Financial resource strain: Not on file  . Food insecurity:    Worry: Not on file    Inability: Not on file  . Transportation needs:    Medical: Not on file    Non-medical: Not on file  Tobacco Use  . Smoking status: Never Smoker  . Smokeless tobacco: Never Used  Substance and Sexual Activity  . Alcohol use: No  . Drug use: No  . Sexual activity: Not Currently  Lifestyle  . Physical activity:    Days per week: Not on file    Minutes per session: Not on file  . Stress: Not on file  Relationships  . Social connections:    Talks on phone: Not on file    Gets together: Not on file    Attends religious service: Not on file    Active member of club or organization: Not on file    Attends meetings of clubs or organizations: Not on file    Relationship status: Not on file  . Intimate partner violence:    Fear of current or ex partner: Not on file    Emotionally abused: Not on file    Physically abused: Not on file    Forced sexual activity: Not on file  Other Topics Concern  . Not on file  Social History Narrative   3 children but one passed    ALLERGIES: Patient has no known allergies.  MEDICATIONS:  Current Outpatient Medications  Medication Sig Dispense Refill  . Cholecalciferol (VITAMIN D3) 1000 UNITS CAPS Take 1 capsule by mouth daily.    . [employer] ATHLETES FOOT 1 % cream Apply 1 application topically daily as needed.    . [employer] EAR DROPS 6.5 % OTIC solution 5 drops. Into aff ear 2x daily for 10 days    . lisinopril (PRINIVIL,ZESTRIL) 10 MG tablet Take 1 tablet by mouth daily.    . sildenafil (VIAGRA) 100 MG tablet Take 100 mg by mouth as needed.    Marland Kitchen abiraterone acetate ([name]) 250 MG tablet Take 250 mg by mouth daily. Take on an empty stomach 1 hour before or 2 hours after a meal    . enzalutamide (XTANDI) 40  MG capsule Take 4 capsules (160 mg total) by mouth daily. (Patient not taking: Reported on 01/21/2019) 120 capsule 0  . Multiple Vitamin (MULTIVITAMIN) tablet Take 1 tablet by mouth daily.    . predniSONE (DELTASONE) 5 MG tablet     . vitamin B-12 (CYANOCOBALAMIN) 1000 MCG tablet Take 1,000 mcg by mouth daily.     No current facility-administered medications for this encounter.     REVIEW OF SYSTEMS:  On review of systems, the patient reports that he is doing well overall. He denies any chest pain, shortness of breath, cough, fevers, chills, night sweats, or unintended weight  changes. He denies any bowel or bladder disturbances, and denies abdominal pain, nausea or vomiting. He reports intermittent left hip pain that is chronic, unchanged recently and does not warrant pain medication. He denies any new or specific sites of bone pain.  He reports rare numbness in his left leg (chronic) and denies numbness or tingling in the upper extremities. He denies any new skin lesions or concerns. A complete review of systems is obtained and is otherwise negative.  PHYSICAL EXAM: Not performed in light of teleconference consult platform.  Wt Readings from Last 3 Encounters:  02/16/19 159 lb (72.1 kg)  01/21/19 159 lb 4.8 oz (72.3 kg)  12/19/18 156 lb 7.7 oz (71 kg)   Temp Readings from Last 3 Encounters:  01/21/19 98.3 F (36.8 C) (Oral)  12/19/18 (!) 97.5 F (36.4 C) (Oral)  12/16/18 98.5 F (36.9 C) (Oral)   BP Readings from Last 3 Encounters:  01/21/19 (!) 153/99  12/19/18 127/75  12/16/18 (!) 158/90   Pulse Readings from Last 3 Encounters:  01/21/19 89  12/19/18 87  12/16/18 71   Pain Assessment Pain Score: 0-No pain/10   LABORATORY DATA:  Lab Results  Component Value Date   WBC 3.8 (L) 01/19/2019   HGB 11.3 (L) 01/19/2019   HCT 35.3 (L) 01/19/2019   MCV 90.1 01/19/2019   PLT 251 01/19/2019   Lab Results  Component Value Date   NA 142 01/19/2019   K 4.3 01/19/2019   CL 104  01/19/2019   CO2 27 01/19/2019   Lab Results  Component Value Date   ALT 13 01/19/2019   AST 18 01/19/2019   ALKPHOS 88 01/19/2019   BILITOT 0.4 01/19/2019     RADIOGRAPHY: No results found.    IMPRESSION/PLAN: This visit was conducted via telephone to spare the patient unnecessary potential exposure in the healthcare setting during the current COVID-19 pandemic. 1. 78 y.o. 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 Today, we talked to the patient about the findings and workup thus far. We discussed the natural history of metastatic prostate cancer and general treatment, highlighting the role of Xogifo infusions in the management. We focused on the details of logistics and delivery. The recommendation is to proceed with monthly infusions of Xofigo x6. We will monitor labs prior to each infusion to ensure it is safe to proceed with each treatment. We reviewed the anticipated acute and late sequelae associated with Xofigo in this setting. The patient was encouraged to ask questions that were answered to his satisfaction.  At the end of our conversation, the patient elects to proceed with Xofigo infusions. We will share this information with Dr. Alen Blew and proceed with treatment planning in anticipation of beginning treatment in the next 1-2 weeks. Romie Jumper in our office will contact the patient in the next few days to set up these appointments. The patient was encouraged to call with any additional questions or concerns in the interim.  Given current concerns for patient exposure during the COVID-19 pandemic, this encounter was conducted via telephone. The patient was notified in advance and was offered a [place] meeting to allow for face to face communication but unfortunately reported that he did not have the appropriate resources/technology to support such a visit and instead preferred to proceed with  telephone consult. The patient has given verbal consent for this type of encounter. The time spent during this encounter was 45 minutes with 50% of that time spent  in the coordination of the patient's care and review of outside records. The attendants for this meeting include Tyler Pita MD, Ashlyn Bruning PA-C, [facility]- scribe, and patient Austin Oconnor. During the encounter, Tyler Pita MD, Ashlyn Bruning PA-C, and scribe, Rae Lips were located at [facility] Radiation Oncology Department.  Patient Austin Oconnor was located at home.    Nicholos Johns, PA-C    Tyler Pita, MD  [facility] Direct Dial: [phone]  Fax: [phone] [email or web address]  Skype  [email or web address]  This document serves as a record of services personally performed by Tyler Pita, MD and Freeman Caldron, PA-C. It was created on their behalf by Rae Lips, a trained medical scribe. The creation of this record is based on the scribe's personal observations and the providers' statements to them. This document has been checked and approved by the attending providers.

## 2019-02-16 NOTE — Addendum Note (Signed)
Encounter addended by: Heywood Footman, RN on: 02/16/2019 1:47 PM  Actions taken: Visit diagnoses modified

## 2019-02-16 NOTE — Progress Notes (Signed)
See progress note under physician encounter. 

## 2019-02-19 ENCOUNTER — Telehealth: Payer: Self-pay | Admitting: *Deleted

## 2019-02-19 ENCOUNTER — Other Ambulatory Visit (HOSPITAL_COMMUNITY): Payer: Self-pay | Admitting: Radiation Oncology

## 2019-02-19 DIAGNOSIS — C61 Malignant neoplasm of prostate: Secondary | ICD-10-CM

## 2019-02-19 NOTE — Telephone Encounter (Signed)
CALLED PATIENT TO INFORM OF LAB AND WEIGHT ON 03-05-19 - @ 12 PM @ [place]. ON 03-12-19 - ARRIVAL TIME - 11:45 AM @ [facility], SPOKE WITH PATIENT AND HE IS AWARE OF THESE APPTS.

## 2019-03-04 ENCOUNTER — Telehealth: Payer: Self-pay | Admitting: *Deleted

## 2019-03-04 NOTE — Telephone Encounter (Signed)
CALLED PATIENT TO REMIND OF LAB AND WEIGHT FOR 03-05-19- ARRIVAL TIME - 11:45 AM @ [place], SPOKE WITH PATIENT AND HE IS AWARE OF THIS APPT.

## 2019-03-05 ENCOUNTER — Other Ambulatory Visit: Payer: Self-pay

## 2019-03-05 ENCOUNTER — Ambulatory Visit
Admission: RE | Admit: 2019-03-05 | Discharge: 2019-03-05 | Disposition: A | Discharge status: Home / Self Care | Payer: Medicare Other | Source: Ambulatory Visit | Attending: Radiation Oncology | Admitting: Radiation Oncology

## 2019-03-05 ENCOUNTER — Other Ambulatory Visit: Payer: Self-pay | Admitting: Radiation Oncology

## 2019-03-05 DIAGNOSIS — C7951 Secondary malignant neoplasm of bone: Secondary | ICD-10-CM | POA: Diagnosis present

## 2019-03-05 DIAGNOSIS — C7952 Secondary malignant neoplasm of bone marrow: Principal | ICD-10-CM

## 2019-03-05 LAB — CBC WITH DIFFERENTIAL (CANCER CENTER ONLY)
Abs Immature Granulocytes: 0.08 10*3/uL — ABNORMAL HIGH (ref 0.00–0.07)
Basophils Absolute: 0 10*3/uL (ref 0.0–0.1)
Basophils Relative: 0 %
Eosinophils Absolute: 0 10*3/uL (ref 0.0–0.5)
Eosinophils Relative: 0 %
HCT: 33.6 % — ABNORMAL LOW (ref 39.0–52.0)
Hemoglobin: 11 g/dL — ABNORMAL LOW (ref 13.0–17.0)
Immature Granulocytes: 2 %
Lymphocytes Relative: 44 %
Lymphs Abs: 2 10*3/uL (ref 0.7–4.0)
MCH: 28.5 pg (ref 26.0–34.0)
MCHC: 32.7 g/dL (ref 30.0–36.0)
MCV: 87 fL (ref 80.0–100.0)
Monocytes Absolute: 0.4 10*3/uL (ref 0.1–1.0)
Monocytes Relative: 10 %
Neutro Abs: 2 10*3/uL (ref 1.7–7.7)
Neutrophils Relative %: 44 %
Platelet Count: 192 10*3/uL (ref 150–400)
RBC: 3.86 MIL/uL — ABNORMAL LOW (ref 4.22–5.81)
RDW: 14.7 % (ref 11.5–15.5)
WBC Count: 4.5 10*3/uL (ref 4.0–10.5)
nRBC: 0 % (ref 0.0–0.2)

## 2019-03-11 ENCOUNTER — Telehealth: Payer: Self-pay | Admitting: *Deleted

## 2019-03-11 NOTE — Telephone Encounter (Signed)
Called patient to remind of Xofigo Inj. For 03-12-19 - arrival time - 11:45 am @ [facility], spoke with patient and he is aware of this inj.

## 2019-03-12 ENCOUNTER — Ambulatory Visit (HOSPITAL_COMMUNITY)
Admission: RE | Admit: 2019-03-12 | Discharge: 2019-03-12 | Disposition: A | Discharge status: Home / Self Care | Payer: Medicare Other | Source: Ambulatory Visit | Attending: Radiation Oncology | Admitting: Radiation Oncology

## 2019-03-12 ENCOUNTER — Other Ambulatory Visit: Payer: Self-pay

## 2019-03-12 DIAGNOSIS — C61 Malignant neoplasm of prostate: Secondary | ICD-10-CM | POA: Diagnosis present

## 2019-03-12 DIAGNOSIS — C7951 Secondary malignant neoplasm of bone: Secondary | ICD-10-CM

## 2019-03-12 MED ORDER — RADIUM RA 223 DICHLORIDE 30 MCCI/ML IV SOLN
101.9700 | Freq: Once | INTRAVENOUS | Status: AC | PRN
  Administered 2019-03-12: 101.97 via INTRAVENOUS

## 2019-03-12 NOTE — Progress Notes (Signed)
  Radiation Oncology         [phone]) [phone] ________________________________  Name: Austin Oconnor MRN: [id number]  Date: 03/12/2019  DOB: 01-Jun-1941  Radium-223 Infusion Note  Diagnosis:  Castration resistant prostate cancer with painful bone involvement  Current Infusion:    1  Planned Infusions:  6  Narrative: Mr. [name] presented to nuclear medicine for treatment. His most recent blood counts were reviewed.  He remains a good candidate to proceed with Ra-223.  The patient was situated in an infusion suite with a contact barrier placed under his arm. Intravenous access was established, using sterile technique, and a normal saline infusion from a syringe was started.  Micro-dosimetry:  The prescribed radiation activity was assayed and confirmed to be within specified tolerance.  Special Treatment Procedure - Infusion:  The nuclear medicine technologist and I personally verified the dose activity to be delivered as specified in the written directive, and verified the patient identification via 2 separate methods.  The syringe containing the dose was attached to an intravenous access and the dose delivered over a minute. No complications were noted.  The total administered dose was 106.7 microcuries in a volume of 4.38 cc.   A saline flush of the line and the syringe that contained the isotope was then performed.  The residual radioactivity in the syringe was 4.73 microcuries, so the actual infused isotope activity was 101.97 microcuries.   Pressure was applied to the venipuncture site, and a compression bandage placed.   Radiation Safety personnel were present to perform the discharge survey, as detailed on their documentation.   After a short period of observation, the patient had his IV removed.  Impression:  The patient tolerated his infusion relatively well.  Plan:  The patient will return in one month for ongoing care.    ________________________________  Sheral Apley. Tammi Klippel, M.D.    This document serves as a record of services personally performed by Tyler Pita, MD. It was created on his behalf by Wilburn Mylar, a trained medical scribe. The creation of this record is based on the scribe's personal observations and the provider's statements to them. This document has been checked and approved by the attending provider.

## 2019-03-23 ENCOUNTER — Telehealth: Payer: Self-pay | Admitting: *Deleted

## 2019-03-23 ENCOUNTER — Other Ambulatory Visit (HOSPITAL_COMMUNITY): Payer: Self-pay | Admitting: Radiation Oncology

## 2019-03-23 DIAGNOSIS — C7951 Secondary malignant neoplasm of bone: Secondary | ICD-10-CM

## 2019-03-23 DIAGNOSIS — C61 Malignant neoplasm of prostate: Secondary | ICD-10-CM

## 2019-03-23 NOTE — Telephone Encounter (Signed)
CALLED PATIENT TO INFORM OF LAB AND WEIGHT ON 04-06-19 - ARRIVAL TIME- 11:45 AM @ [place]. ON 04-13-19- ARRIVAL TIME- 11:45 AM @ [facility], SPOKE WITH PATIENT AND HE IS AWARE OF THESE APPTS.

## 2019-04-02 ENCOUNTER — Other Ambulatory Visit: Payer: Self-pay | Admitting: Radiation Oncology

## 2019-04-02 ENCOUNTER — Telehealth: Payer: Self-pay | Admitting: *Deleted

## 2019-04-02 DIAGNOSIS — C7951 Secondary malignant neoplasm of bone: Secondary | ICD-10-CM

## 2019-04-02 NOTE — Telephone Encounter (Signed)
Called patient to remind of lab and weight for 04-06-19 - arrival time - 11:45 am @ [facility], spoke with patient and he is aware of this appt.

## 2019-04-04 ENCOUNTER — Other Ambulatory Visit: Payer: Self-pay | Admitting: Oncology

## 2019-04-06 ENCOUNTER — Other Ambulatory Visit: Payer: Self-pay

## 2019-04-06 ENCOUNTER — Ambulatory Visit
Admission: RE | Admit: 2019-04-06 | Discharge: 2019-04-06 | Disposition: A | Discharge status: Home / Self Care | Payer: Medicare Other | Source: Ambulatory Visit | Attending: Radiation Oncology | Admitting: Radiation Oncology

## 2019-04-06 DIAGNOSIS — C7951 Secondary malignant neoplasm of bone: Secondary | ICD-10-CM | POA: Diagnosis not present

## 2019-04-06 DIAGNOSIS — C7952 Secondary malignant neoplasm of bone marrow: Secondary | ICD-10-CM

## 2019-04-06 LAB — CBC WITH DIFFERENTIAL (CANCER CENTER ONLY)
Abs Immature Granulocytes: 0.09 10*3/uL — ABNORMAL HIGH (ref 0.00–0.07)
Basophils Absolute: 0 10*3/uL (ref 0.0–0.1)
Basophils Relative: 1 %
Eosinophils Absolute: 0 10*3/uL (ref 0.0–0.5)
Eosinophils Relative: 0 %
HCT: 33.8 % — ABNORMAL LOW (ref 39.0–52.0)
Hemoglobin: 10.6 g/dL — ABNORMAL LOW (ref 13.0–17.0)
Immature Granulocytes: 2 %
Lymphocytes Relative: 42 %
Lymphs Abs: 1.8 10*3/uL (ref 0.7–4.0)
MCH: 27.9 pg (ref 26.0–34.0)
MCHC: 31.4 g/dL (ref 30.0–36.0)
MCV: 88.9 fL (ref 80.0–100.0)
Monocytes Absolute: 0.6 10*3/uL (ref 0.1–1.0)
Monocytes Relative: 13 %
Neutro Abs: 1.8 10*3/uL (ref 1.7–7.7)
Neutrophils Relative %: 42 %
Platelet Count: 223 10*3/uL (ref 150–400)
RBC: 3.8 MIL/uL — ABNORMAL LOW (ref 4.22–5.81)
RDW: 15.9 % — ABNORMAL HIGH (ref 11.5–15.5)
WBC Count: 4.2 10*3/uL (ref 4.0–10.5)
nRBC: 0 % (ref 0.0–0.2)

## 2019-04-12 ENCOUNTER — Telehealth: Payer: Self-pay | Admitting: *Deleted

## 2019-04-12 NOTE — Telephone Encounter (Signed)
Called patient to remind of Xofigo Inj. for 04-13-19 - arrival time - 11:45 am @ [facility], spoke with patient and he is aware of this inj.

## 2019-04-13 ENCOUNTER — Other Ambulatory Visit: Payer: Self-pay

## 2019-04-13 ENCOUNTER — Ambulatory Visit (HOSPITAL_COMMUNITY)
Admission: RE | Admit: 2019-04-13 | Discharge: 2019-04-13 | Disposition: A | Discharge status: Home / Self Care | Payer: Medicare Other | Source: Ambulatory Visit | Attending: Radiation Oncology | Admitting: Radiation Oncology

## 2019-04-13 DIAGNOSIS — C7951 Secondary malignant neoplasm of bone: Secondary | ICD-10-CM | POA: Insufficient documentation

## 2019-04-13 DIAGNOSIS — C61 Malignant neoplasm of prostate: Secondary | ICD-10-CM | POA: Diagnosis present

## 2019-04-13 MED ORDER — RADIUM RA 223 DICHLORIDE 30 MCCI/ML IV SOLN
105.9000 | Freq: Once | INTRAVENOUS | Status: AC | PRN
  Administered 2019-04-13: 105.9 via INTRAVENOUS

## 2019-04-13 NOTE — Progress Notes (Signed)
  Radiation Oncology         (336) [phone] ________________________________  Name: Austin Oconnor MRN: [id number]  Date: 04/13/2019  DOB: [birth year]  Radium-223 Infusion Note  Diagnosis:  Castration resistant prostate cancer with painful bone involvement  Current Infusion:    2  Planned Infusions:  6  Narrative: Austin Oconnor presented to nuclear medicine for treatment. His most recent blood counts were reviewed.  He remains a good candidate to proceed with Ra-223.  The patient was situated in an infusion suite with a contact barrier placed under his arm. Intravenous access was established, using sterile technique, and a normal saline infusion from a syringe was started.  Micro-dosimetry:  The prescribed radiation activity was assayed and confirmed to be within specified tolerance.  Special Treatment Procedure - Infusion:  The nuclear medicine technologist and I personally verified the dose activity to be delivered as specified in the written directive, and verified the patient identification via 2 separate methods.  The syringe containing the dose was attached to an intravenous access and the dose delivered over a minute. No complications were noted.  The total administered dose was 108.7 microcuries.   A saline flush of the line and the syringe that contained the isotope was then performed.  The residual radioactivity in the syringe was 2.8 microcuries, so the actual infused isotope activity was 105.9 microcuries.   Pressure was applied to the venipuncture site, and a compression bandage placed.   Radiation Safety personnel were present to perform the discharge survey, as detailed on their documentation.   After a short period of observation, the patient had his IV removed.  Impression:  The patient tolerated his infusion relatively well.  Plan:  The patient will return in one month for ongoing care.    ________________________________  Sheral Apley. Tammi Klippel, M.D.

## 2019-04-16 ENCOUNTER — Telehealth: Payer: Self-pay | Admitting: *Deleted

## 2019-04-16 ENCOUNTER — Other Ambulatory Visit (HOSPITAL_COMMUNITY): Payer: Self-pay | Admitting: Radiation Oncology

## 2019-04-16 DIAGNOSIS — C61 Malignant neoplasm of prostate: Secondary | ICD-10-CM

## 2019-04-16 DIAGNOSIS — C7951 Secondary malignant neoplasm of bone: Secondary | ICD-10-CM

## 2019-04-16 NOTE — Telephone Encounter (Signed)
CALLED PATIENT TO INFORM OF LAB AND WEIGHT FOR 05-18-19 @ 12 PM @ [place]. ON 05-25-19 - ARRIVAL TIME- 11:45 AM @ [facility], LVM FOR A RETURN CALL

## 2019-04-27 ENCOUNTER — Other Ambulatory Visit: Payer: Self-pay

## 2019-04-27 ENCOUNTER — Inpatient Hospital Stay: Payer: Medicare Other

## 2019-04-27 ENCOUNTER — Inpatient Hospital Stay: Payer: Medicare Other | Attending: Oncology | Admitting: Oncology

## 2019-04-27 ENCOUNTER — Telehealth: Payer: Self-pay | Admitting: Oncology

## 2019-04-27 VITALS — BP 142/86 | HR 97 | Temp 97.8°F | Resp 18 | Ht 68.0 in | Wt 156.5 lb

## 2019-04-27 DIAGNOSIS — Z79899 Other long term (current) drug therapy: Secondary | ICD-10-CM | POA: Diagnosis not present

## 2019-04-27 DIAGNOSIS — C7951 Secondary malignant neoplasm of bone: Secondary | ICD-10-CM | POA: Insufficient documentation

## 2019-04-27 DIAGNOSIS — Z923 Personal history of irradiation: Secondary | ICD-10-CM | POA: Diagnosis not present

## 2019-04-27 DIAGNOSIS — C61 Malignant neoplasm of prostate: Secondary | ICD-10-CM

## 2019-04-27 DIAGNOSIS — I10 Essential (primary) hypertension: Secondary | ICD-10-CM | POA: Diagnosis not present

## 2019-04-27 DIAGNOSIS — E291 Testicular hypofunction: Secondary | ICD-10-CM | POA: Insufficient documentation

## 2019-04-27 LAB — CMP (CANCER CENTER ONLY)
ALT: 17 U/L (ref 0–44)
AST: 29 U/L (ref 15–41)
Albumin: 3.5 g/dL (ref 3.5–5.0)
Alkaline Phosphatase: 97 U/L (ref 38–126)
Anion gap: 10 (ref 5–15)
BUN: 15 mg/dL (ref 8–23)
CO2: 26 mmol/L (ref 22–32)
Calcium: 8.6 mg/dL — ABNORMAL LOW (ref 8.9–10.3)
Chloride: 103 mmol/L (ref 98–111)
Creatinine: 0.84 mg/dL (ref 0.61–1.24)
GFR, Est AFR Am: >60 mL/min (ref >60)
GFR, Estimated: >60 mL/min (ref >60)
Glucose, Bld: 97 mg/dL (ref 70–99)
Potassium: 4.7 mmol/L (ref 3.5–5.1)
Sodium: 139 mmol/L (ref 135–145)
Total Bilirubin: 0.3 mg/dL (ref 0.3–1.2)
Total Protein: 7 g/dL (ref 6.5–8.1)

## 2019-04-27 LAB — CBC WITH DIFFERENTIAL (CANCER CENTER ONLY)
Abs Immature Granulocytes: 0.06 10*3/uL (ref 0.00–0.07)
Basophils Absolute: 0 10*3/uL (ref 0.0–0.1)
Basophils Relative: 0 %
Eosinophils Absolute: 0 10*3/uL (ref 0.0–0.5)
Eosinophils Relative: 1 %
HCT: 31.7 % — ABNORMAL LOW (ref 39.0–52.0)
Hemoglobin: 10.3 g/dL — ABNORMAL LOW (ref 13.0–17.0)
Immature Granulocytes: 2 %
Lymphocytes Relative: 37 %
Lymphs Abs: 1.3 10*3/uL (ref 0.7–4.0)
MCH: 28.3 pg (ref 26.0–34.0)
MCHC: 32.5 g/dL (ref 30.0–36.0)
MCV: 87.1 fL (ref 80.0–100.0)
Monocytes Absolute: 0.4 10*3/uL (ref 0.1–1.0)
Monocytes Relative: 12 %
Neutro Abs: 1.8 10*3/uL (ref 1.7–7.7)
Neutrophils Relative %: 48 %
Platelet Count: 191 10*3/uL (ref 150–400)
RBC: 3.64 MIL/uL — ABNORMAL LOW (ref 4.22–5.81)
RDW: 16.6 % — ABNORMAL HIGH (ref 11.5–15.5)
WBC Count: 3.6 10*3/uL — ABNORMAL LOW (ref 4.0–10.5)
nRBC: 0 % (ref 0.0–0.2)

## 2019-04-27 NOTE — Telephone Encounter (Signed)
Scheduled appt per los. Called and left msg. Mailed printout

## 2019-04-27 NOTE — Progress Notes (Signed)
Hematology and Oncology Follow Up Visit  Austin Oconnor [id number] 1941-01-22 78 y.o. 04/27/2019 9:12 AM Austin Oconnor, MDVaradarajan, Oconnor,*   Principle Diagnosis: 78 year old man with castration-resistant prostate cancer with disease to the bone noted in 2017.  Advanced prostate cancer with disease to the bone diagnosed in 2017.  He was initially diagnosed in 2015 with Gleason score of 4+5 = 9.   Prior Therapy:  He is status post radiation therapy to the area under the care of Dr. Tammi Klippel between 02/19/2016 and 03/01/2016.   He was treated with Lupron under the care of Dr. Luberta Robertson every 4 months.   His PSA was up to 0.49 in November 2017 and in February 2018 was 2.07 his testosterone was adequately castrate at that time.  Indicating castration resistant disease.   [name] 1000 mg daily with prednisone 5 mg daily started in March 2018.  Therapy discontinued in February 2020 due to progression of disease.  He was started on Xtandi which was discontinued after poor tolerance in March 2020.  Current therapy: Trudi Ida on a monthly basis started in May 2020.  He is status post 2 months of therapy.  Interim History: Austin Oconnor returns today for repeat evaluation.  Since the last visit, he started Xofigo under the care of Dr. Tammi Klippel which he has tolerated without any major complaints.  He does report some occasional dizziness but no lightheadedness or chest pain.  His performance status and quality of life remain excellent.  Continues to ambulate without any major difficulties.  He eats well and continues to enjoy reasonable quality of life.   He denied headaches, blurry vision, syncope or seizures.  Denies any fevers, chills or sweats.  Denied chest pain, palpitation, orthopnea or leg edema.  Denied cough, wheezing or hemoptysis.  Denied nausea, vomiting or abdominal pain.  Denies any constipation or diarrhea.  Denies any frequency urgency or hesitancy.  Denies any arthralgias or  myalgias.  Denies any skin rashes or lesions.  Denies any bleeding or clotting tendency.  Denies any easy bruising.  Denies any hair or nail changes.  Denies any anxiety or depression.  Remaining review of system is negative.         Medications: I have reviewed the patient's current medications.  Current Outpatient Medications  Medication Sig Dispense Refill  . abiraterone acetate ([name]) 250 MG tablet Take 250 mg by mouth daily. Take on an empty stomach 1 hour before or 2 hours after a meal    . Cholecalciferol (VITAMIN D3) 1000 UNITS CAPS Take 1 capsule by mouth daily.    . CVS ATHLETES FOOT 1 % cream Apply 1 application topically daily as needed.    . CVS EAR DROPS 6.5 % OTIC solution 5 drops. Into aff ear 2x daily for 10 days    . enzalutamide (XTANDI) 40 MG capsule Take 4 capsules (160 mg total) by mouth daily. (Patient not taking: Reported on 01/21/2019) 120 capsule 0  . lisinopril (PRINIVIL,ZESTRIL) 10 MG tablet Take 1 tablet by mouth daily.    . Multiple Vitamin (MULTIVITAMIN) tablet Take 1 tablet by mouth daily.    . predniSONE (DELTASONE) 5 MG tablet TAKE 1 TABLET (5 MG TOTAL) BY MOUTH DAILY WITH BREAKFAST. 90 tablet 1  . sildenafil (VIAGRA) 100 MG tablet Take 100 mg by mouth as needed.    . vitamin B-12 (CYANOCOBALAMIN) 1000 MCG tablet Take 1,000 mcg by mouth daily.     No current facility-administered medications for this visit.  Allergies: No Known Allergies  Past Medical History, Surgical history, Social history, and Family History were reviewed and updated.   Physical Exam:   Blood pressure (!) 142/86, pulse 97, temperature 97.8 F (36.6 C), temperature source Oral, resp. rate 18, height 5\' 8"  (1.727 m), weight 156 lb 8 oz (71 kg), SpO2 98 %.      ECOG: 0    General appearance: Alert, awake without any distress. Head: Atraumatic without abnormalities Oropharynx: Without any thrush or ulcers. Eyes: No scleral icterus. Lymph nodes: No  lymphadenopathy noted in the cervical, supraclavicular, or axillary nodes Heart:regular rate and rhythm, without any murmurs or gallops.   Lung: Clear to auscultation without any rhonchi, wheezes or dullness to percussion. Abdomin: Soft, nontender without any shifting dullness or ascites. Musculoskeletal: No clubbing or cyanosis. Neurological: No motor or sensory deficits. Skin: No rashes or lesions. Psychiatric: Mood and affect appeared normal.     Lab Results: Lab Results  Component Value Date   WBC 4.2 04/06/2019   HGB 10.6 (L) 04/06/2019   HCT 33.8 (L) 04/06/2019   MCV 88.9 04/06/2019   PLT 223 04/06/2019     Chemistry      Component Value Date/Time   NA 142 01/19/2019 0809   NA 143 09/16/2017 0805   K 4.3 01/19/2019 0809   K 3.4 (L) 09/16/2017 0805   CL 104 01/19/2019 0809   CO2 27 01/19/2019 0809   CO2 29 09/16/2017 0805   BUN 15 01/19/2019 0809   BUN 9.1 09/16/2017 0805   CREATININE 0.89 01/19/2019 0809   CREATININE 0.8 09/16/2017 0805      Component Value Date/Time   CALCIUM 8.3 (L) 01/19/2019 0809   CALCIUM 8.4 09/16/2017 0805   ALKPHOS 88 01/19/2019 0809   ALKPHOS 53 09/16/2017 0805   AST 18 01/19/2019 0809   AST 26 09/16/2017 0805   ALT 13 01/19/2019 0809   ALT 19 09/16/2017 0805   BILITOT 0.4 01/19/2019 0809   BILITOT 0.64 09/16/2017 0805      Results for Austin Oconnor, Austin Oconnor (MRN [id number]) as of 04/27/2019 09:15  Ref. Range 12/15/2018 08:35 01/19/2019 08:09  Prostate Specific Ag, Serum Latest Ref Range: 0.0 - 4.0 ng/mL 75.6 (H) 160.0 (H)        Impression and Plan:  78 year old man with:  1.  Castration-resistant prostate cancer with disease to the bone.    He is currently on Xofigo which she has tolerated without any major complications.  He completed 2 months of therapy out of potential 6 treatments.  The natural course of this disease and future treatment options were reiterated.  These include systemic chemotherapy as well as possible PARP  inhibitor if he has the appropriate genetic mutation.  This will be deferred till he completes [place].  He is agreeable to continue at this time and agrees with this plan.  2. Androgen deprivation: he continues to receive that under the care of Dr. Diona Fanti.  I recommended continuing that indefinitely.  3. Bone directed therapy: I recommended continuing Xgeva which she currently receives under the care of Dr. Diona Fanti.  4.  Hypertension: His blood pressure close to normal range at this time but has been hypotensive previously.  5.  Goals of care and prognosis: His disease remains incurable but aggressive therapy is warranted given his excellent performance status.  6. Follow-up: Will be in 4 months to follow his progress.  25  minutes was spent with the patient face-to-face today.  More than 50% of time  was spent on reviewing his disease status, treatment options, complications of therapy and answering questions regarding future plan of care.    Zola Button, MD 6/16/20209:12 AM

## 2019-04-28 LAB — PROSTATE-SPECIFIC AG, SERUM (LABCORP): Prostate Specific Ag, Serum: 357 ng/mL — ABNORMAL HIGH (ref 0.0–4.0)

## 2019-05-17 ENCOUNTER — Other Ambulatory Visit: Payer: Self-pay | Admitting: Radiation Oncology

## 2019-05-17 ENCOUNTER — Telehealth: Payer: Self-pay | Admitting: *Deleted

## 2019-05-17 DIAGNOSIS — C7952 Secondary malignant neoplasm of bone marrow: Secondary | ICD-10-CM

## 2019-05-17 DIAGNOSIS — C7951 Secondary malignant neoplasm of bone: Secondary | ICD-10-CM

## 2019-05-17 NOTE — Telephone Encounter (Signed)
CALLED PATIENT TO REMIND OF LAB AND WEIGHT ON 05-18-19 - ARRIVAL TIME- 11:45 AM @ [place], SPOKE WITH PATIENT AND HE IS AWARE OF THIS APPT.

## 2019-05-18 ENCOUNTER — Other Ambulatory Visit: Payer: Self-pay

## 2019-05-18 ENCOUNTER — Ambulatory Visit
Admission: RE | Admit: 2019-05-18 | Discharge: 2019-05-18 | Disposition: A | Discharge status: Home / Self Care | Payer: Medicare Other | Source: Ambulatory Visit | Attending: Radiation Oncology | Admitting: Radiation Oncology

## 2019-05-18 DIAGNOSIS — C7951 Secondary malignant neoplasm of bone: Secondary | ICD-10-CM | POA: Insufficient documentation

## 2019-05-18 LAB — CBC WITH DIFFERENTIAL (CANCER CENTER ONLY)
Abs Immature Granulocytes: 0.22 10*3/uL — ABNORMAL HIGH (ref 0.00–0.07)
Basophils Absolute: 0 10*3/uL (ref 0.0–0.1)
Basophils Relative: 0 %
Eosinophils Absolute: 0 10*3/uL (ref 0.0–0.5)
Eosinophils Relative: 0 %
HCT: 27 % — ABNORMAL LOW (ref 39.0–52.0)
Hemoglobin: 8.8 g/dL — ABNORMAL LOW (ref 13.0–17.0)
Immature Granulocytes: 5 %
Lymphocytes Relative: 34 %
Lymphs Abs: 1.6 10*3/uL (ref 0.7–4.0)
MCH: 28.3 pg (ref 26.0–34.0)
MCHC: 32.6 g/dL (ref 30.0–36.0)
MCV: 86.8 fL (ref 80.0–100.0)
Monocytes Absolute: 0.5 10*3/uL (ref 0.1–1.0)
Monocytes Relative: 11 %
Neutro Abs: 2.3 10*3/uL (ref 1.7–7.7)
Neutrophils Relative %: 50 %
Platelet Count: 210 10*3/uL (ref 150–400)
RBC: 3.11 MIL/uL — ABNORMAL LOW (ref 4.22–5.81)
RDW: 16.9 % — ABNORMAL HIGH (ref 11.5–15.5)
WBC Count: 4.7 10*3/uL (ref 4.0–10.5)
nRBC: 0 % (ref 0.0–0.2)

## 2019-05-24 ENCOUNTER — Telehealth: Payer: Self-pay | Admitting: *Deleted

## 2019-05-24 NOTE — Telephone Encounter (Signed)
CALLED PATIENT TO REMIND OF XOFIGO INJ. FOR 05-25-19 - ARRIVAL TIME- 11:45 AM @ [facility], SPOKE WITH PATIENT AND HE IS AWARE OF THIS APPT.

## 2019-05-25 ENCOUNTER — Telehealth: Payer: Self-pay | Admitting: *Deleted

## 2019-05-25 ENCOUNTER — Telehealth: Payer: Self-pay | Admitting: Urology

## 2019-05-25 ENCOUNTER — Ambulatory Visit (HOSPITAL_COMMUNITY): Admission: RE | Admit: 2019-05-25 | Payer: Medicare Other | Source: Ambulatory Visit

## 2019-05-25 ENCOUNTER — Other Ambulatory Visit: Payer: Self-pay | Admitting: Urology

## 2019-05-25 DIAGNOSIS — D649 Anemia, unspecified: Secondary | ICD-10-CM

## 2019-05-25 DIAGNOSIS — C7951 Secondary malignant neoplasm of bone: Secondary | ICD-10-CM

## 2019-05-25 DIAGNOSIS — C61 Malignant neoplasm of prostate: Secondary | ICD-10-CM

## 2019-05-25 NOTE — Telephone Encounter (Signed)
CALLED PATIENT TO INFORM THAT THE PROVIDER WANTS HIM TO HAVE LABS ON 06-01-19 INSTEAD OF 06-08-19, PATIENT AGREED TO COME IN @ 9 AM ON 06-01-19

## 2019-05-25 NOTE — Telephone Encounter (Signed)
I left a message on VM requesting the patient return my call for further discussion of his recent labs and need to cancel/postpone his Xofigo treatment today due to Hgb at 8.8 which is down from 10.3 on 04/27/19 and 10.6 on 04/06/19.  Will plan to repeat his labs in 1 week to determine if ok to proceed with Xofigo infusions or need to d/c and further evaluate the anemia. Per discussion with SH, patient denies any recent hematochezia, hematuria or hematoptysis and has been feeling well in general.  Nicholos Johns, MMS, PA-C [place] [facility] [place]: [phone]  Fax: [phone]

## 2019-05-25 NOTE — Telephone Encounter (Signed)
Called patient to ask about coming in on 06-01-19 for labs instead of 06/08/19, lvm for a return call

## 2019-05-25 NOTE — Telephone Encounter (Signed)
CALLED PATIENT TO INFORM THAT HIS XOFIGO INJ. WOULD NOT HAPPEN TODAY DUE TO HIS BLOOD COUNTS, INFORMED THAT HE NEEDS LABS DRAWN IN 2 WEEKS, PATIENT AGREED TO COME IN ON 06-08-19 @ 12 PM @ [facility]

## 2019-06-01 ENCOUNTER — Other Ambulatory Visit: Payer: Self-pay

## 2019-06-01 ENCOUNTER — Ambulatory Visit: Payer: Medicare Other

## 2019-06-01 ENCOUNTER — Ambulatory Visit
Admission: RE | Admit: 2019-06-01 | Discharge: 2019-06-01 | Disposition: A | Discharge status: Home / Self Care | Payer: Medicare Other | Source: Ambulatory Visit | Attending: Radiation Oncology | Admitting: Radiation Oncology

## 2019-06-01 ENCOUNTER — Telehealth: Payer: Self-pay | Admitting: Urology

## 2019-06-01 DIAGNOSIS — C61 Malignant neoplasm of prostate: Secondary | ICD-10-CM

## 2019-06-01 DIAGNOSIS — C7951 Secondary malignant neoplasm of bone: Secondary | ICD-10-CM | POA: Diagnosis not present

## 2019-06-01 DIAGNOSIS — D649 Anemia, unspecified: Secondary | ICD-10-CM

## 2019-06-01 LAB — CBC WITH DIFFERENTIAL (CANCER CENTER ONLY)
Abs Immature Granulocytes: 0.18 10*3/uL — ABNORMAL HIGH (ref 0.00–0.07)
Basophils Absolute: 0 10*3/uL (ref 0.0–0.1)
Basophils Relative: 1 %
Eosinophils Absolute: 0 10*3/uL (ref 0.0–0.5)
Eosinophils Relative: 1 %
HCT: 26.4 % — ABNORMAL LOW (ref 39.0–52.0)
Hemoglobin: 8.4 g/dL — ABNORMAL LOW (ref 13.0–17.0)
Immature Granulocytes: 4 %
Lymphocytes Relative: 38 %
Lymphs Abs: 1.7 10*3/uL (ref 0.7–4.0)
MCH: 28 pg (ref 26.0–34.0)
MCHC: 31.8 g/dL (ref 30.0–36.0)
MCV: 88 fL (ref 80.0–100.0)
Monocytes Absolute: 0.5 10*3/uL (ref 0.1–1.0)
Monocytes Relative: 12 %
Neutro Abs: 2 10*3/uL (ref 1.7–7.7)
Neutrophils Relative %: 44 %
Platelet Count: 149 10*3/uL — ABNORMAL LOW (ref 150–400)
RBC: 3 MIL/uL — ABNORMAL LOW (ref 4.22–5.81)
RDW: 17.9 % — ABNORMAL HIGH (ref 11.5–15.5)
WBC Count: 4.4 10*3/uL (ref 4.0–10.5)
nRBC: 1.8 % — ABNORMAL HIGH (ref 0.0–0.2)

## 2019-06-01 NOTE — Telephone Encounter (Signed)
I spoke with the patient by telephone earlier today to inform him that unfortunately, his hemoglobin had dropped further from 8.8 2 weeks ago to currently 8.4.  After discussion with Dr. Tammi Klippel, the recommendation is to discontinue Xofigo infusions for the time being.  We discussed this today as well as the need for further evaluation.  I advised him that I would share this information with Dr. Alen Blew who has advised repeat labs in October and follow-up as scheduled.  When I tried to reach him back to share this information, I had to leave a voicemail message on his phone.  I advised him to call back with any further questions or concerns.  Otherwise, we will plan to see him back on an as-needed basis and continue to follow his progress via correspondence with Dr. Alen Blew.  Nicholos Johns, MMS, PA-C [place] [facility] [place]: [phone]  Fax: [phone]

## 2019-06-02 ENCOUNTER — Telehealth: Payer: Self-pay | Admitting: Radiation Oncology

## 2019-06-02 NOTE — Telephone Encounter (Addendum)
Phoned patient in response to inbasket message received from [facility]. Patient states, "I just need to know what to do.Marland Kitchenam I supposed to sit here and do nothing?" Reinforced voicemail message left by [employer]. Patient continues to verbalize lack of understanding. In addition, patient requests pain medication prescription. Patient reports he has never been prescribed pain medication in the past and isn't sure what would work for him. Patient reports taking Aleve to take the edge off the pain he feels in his left leg, hip and knee. Patient states, "the aleve doesn't last very long." [facility] [address]. Patient denies any allergies to medications. Patient reports both feet have been swollen since starting xofigo. Patient understands this RN will inform Ashlyn of these finding and either she or I will phone him back.

## 2019-06-02 NOTE — Telephone Encounter (Signed)
-----   Message from Kerri Perches sent at 06/02/2019  2:08 PM EDT ----- Regarding: PAIN MEDS. AND XOFIGO INJ. Hi Sam,   The above patient has been trying to get in touch with Ashlyn and hasn't been successful and he is wanting to know what's going with his Xofigo Inj. and he says that he needs pain meds.  Mr. Symonette phone number is [phone].  Sorry to give you this but that's the way Ashlyn has requested that it be.  Thanks,  Quinn Plowman. remember this is the patient that is the one that I gave you the note on the other day

## 2019-06-02 NOTE — Telephone Encounter (Signed)
Sam, Please advise the patient to address his pain concerns with Dr. Alen Blew since he is managing his systemic disease. -Thania Woodlief

## 2019-06-03 ENCOUNTER — Encounter: Payer: Self-pay | Admitting: Radiation Oncology

## 2019-06-03 ENCOUNTER — Other Ambulatory Visit: Payer: Self-pay | Admitting: Oncology

## 2019-06-03 ENCOUNTER — Telehealth: Payer: Self-pay | Admitting: Radiation Oncology

## 2019-06-03 MED ORDER — HYDROCODONE-ACETAMINOPHEN 5-325 MG PO TABS: 1.0000 | ORAL_TABLET | Freq: Four times a day (QID) | ORAL | 0 refills | Status: DC | PRN

## 2019-06-03 NOTE — Telephone Encounter (Signed)
-----   Message from Freeman Caldron, [place] sent at 06/02/2019  3:45 PM EDT ----- Regarding: Pain management Sam, I hit complete too soon and failed to route the response to you!!!!  Anyway, please advise the patient to address his pain concerns with Dr. Alen Blew since he is managing his systemic disease. -Ashlyn

## 2019-06-03 NOTE — Telephone Encounter (Signed)
Phoned patient back. Explained Dr. Alen Blew has [facility] Explained he should not driving while taking this medication. Explained he should be aware this medication may cause drowsiness. Encouraged colace bid while taking this medication to avoid constipation. Patient verbalized understanding of all reviewed and expressed appreciation for the assistance.

## 2019-06-03 NOTE — Telephone Encounter (Signed)
I will send a prescription to his pharmacy for hydrocodone to use every 6 hours as needed.

## 2019-06-03 NOTE — Telephone Encounter (Signed)
Phoned patient as requested by Freeman Caldron, PA-C. Explained that Coffey feels that Dr. Alen Blew would be best to manage his leg pain since he is managing your systemic disease. Explained to patient a second time that Trudi Ida was stopped due to low blood counts. Also, patient provided verbal consent for medical staff to speak with his niece, Artist Pais, concerning his condition. I updated the chart accordingly. Patient understands this RN will inform Dr. Alen Blew and his nurse of these findings. Explained Dr. Hazeline Junker nurse would be in touch with directions. Provided patient with my contact number for future needs related to xofigo.

## 2019-06-08 ENCOUNTER — Ambulatory Visit: Payer: Medicare Other

## 2019-06-30 ENCOUNTER — Other Ambulatory Visit: Payer: Self-pay

## 2019-06-30 DIAGNOSIS — Z20822 Contact with and (suspected) exposure to covid-19: Secondary | ICD-10-CM

## 2019-07-01 LAB — NOVEL CORONAVIRUS, NAA: SARS-CoV-2, NAA: NOT DETECTED

## 2019-07-21 ENCOUNTER — Other Ambulatory Visit (HOSPITAL_COMMUNITY): Payer: Self-pay | Admitting: Urology

## 2019-07-21 DIAGNOSIS — C61 Malignant neoplasm of prostate: Secondary | ICD-10-CM

## 2019-08-03 ENCOUNTER — Other Ambulatory Visit: Payer: Self-pay

## 2019-08-03 ENCOUNTER — Ambulatory Visit (HOSPITAL_COMMUNITY)
Admission: RE | Admit: 2019-08-03 | Discharge: 2019-08-03 | Disposition: A | Discharge status: Home / Self Care | Payer: Medicare Other | Source: Ambulatory Visit | Attending: Urology | Admitting: Urology

## 2019-08-03 ENCOUNTER — Encounter (HOSPITAL_COMMUNITY)
Admission: RE | Admit: 2019-08-03 | Discharge: 2019-08-03 | Disposition: A | Discharge status: Home / Self Care | Payer: Medicare Other | Source: Ambulatory Visit | Attending: Urology | Admitting: Urology

## 2019-08-03 DIAGNOSIS — C61 Malignant neoplasm of prostate: Secondary | ICD-10-CM | POA: Diagnosis not present

## 2019-08-03 MED ORDER — TECHNETIUM TC 99M MEDRONATE IV KIT
21.8000 | PACK | Freq: Once | INTRAVENOUS | Status: AC | PRN
  Administered 2019-08-03: 21.8 via INTRAVENOUS

## 2019-08-10 ENCOUNTER — Other Ambulatory Visit: Payer: Self-pay

## 2019-08-10 ENCOUNTER — Ambulatory Visit
Admission: RE | Admit: 2019-08-10 | Discharge: 2019-08-10 | Disposition: A | Discharge status: Home / Self Care | Payer: Medicare Other | Source: Ambulatory Visit | Attending: Radiation Oncology | Admitting: Radiation Oncology

## 2019-08-10 ENCOUNTER — Ambulatory Visit
Admission: RE | Admit: 2019-08-10 | Discharge: 2019-08-10 | Disposition: A | Discharge status: Home / Self Care | Payer: Medicare Other | Source: Ambulatory Visit | Attending: Urology | Admitting: Urology

## 2019-08-10 ENCOUNTER — Other Ambulatory Visit: Payer: Self-pay | Admitting: Oncology

## 2019-08-10 ENCOUNTER — Encounter: Payer: Self-pay | Admitting: Urology

## 2019-08-10 VITALS — BP 132/71 | HR 68 | Temp 98.3°F | Resp 20 | Ht 68.0 in | Wt 150.2 lb

## 2019-08-10 DIAGNOSIS — C7951 Secondary malignant neoplasm of bone: Secondary | ICD-10-CM | POA: Diagnosis not present

## 2019-08-10 MED ORDER — HYDROCODONE-ACETAMINOPHEN 5-325 MG PO TABS: 1.0000 | ORAL_TABLET | Freq: Four times a day (QID) | ORAL | 0 refills | Status: DC | PRN

## 2019-08-10 NOTE — Patient Instructions (Signed)
Coronavirus (COVID-19) Are you at risk?  Are you at risk for the Coronavirus (COVID-19)?  To be considered HIGH RISK for Coronavirus (COVID-19), you have to meet the following criteria:  . Traveled to [place], [place], [place], [place] or [place]; or in the [place] to [place], [place], [place], or [place]; and have fever, cough, and shortness of breath within the last 2 weeks of travel OR . Been in close contact with a person diagnosed with COVID-19 within the last 2 weeks and have fever, cough, and shortness of breath . IF YOU DO NOT MEET THESE CRITERIA, YOU ARE CONSIDERED LOW RISK FOR COVID-19.  What to do if you are HIGH RISK for COVID-19?  . If you are having a medical emergency, call 911. . Seek medical care right away. Before you go to a doctor's office, urgent care or emergency department, call ahead and tell them about your recent travel, contact with someone diagnosed with COVID-19, and your symptoms. You should receive instructions from your physician's office regarding next steps of care.  . When you arrive at healthcare provider, tell the healthcare staff immediately you have returned from visiting [place], [place], [place], [place] or [place]; or traveled in the [place] to [place], [place], [place], or [place]; in the last two weeks or you have been in close contact with a person diagnosed with COVID-19 in the last 2 weeks.   . Tell the health care staff about your symptoms: fever, cough and shortness of breath. . After you have been seen by a medical provider, you will be either: o Tested for (COVID-19) and discharged home on quarantine except to seek medical care if symptoms worsen, and asked to  - Stay home and avoid contact with others until you get your results (4-5 days)  - Avoid travel on public transportation if possible (such as bus, train, or airplane) or o Sent to the Emergency Department by EMS for evaluation, COVID-19 testing, and possible  admission depending on your condition and test results.  What to do if you are LOW RISK for COVID-19?  Reduce your risk of any infection by using the same precautions used for avoiding the common cold or flu:  . Wash your hands often with soap and warm water for at least 20 seconds.  If soap and water are not readily available, use an alcohol-based hand sanitizer with at least 60% alcohol.  . If coughing or sneezing, cover your mouth and nose by coughing or sneezing into the elbow areas of your shirt or coat, into a tissue or into your sleeve (not your hands). . Avoid shaking hands with others and consider head nods or verbal greetings only. . Avoid touching your eyes, nose, or mouth with unwashed hands.  . Avoid close contact with people who are sick. . Avoid places or events with large numbers of people in one location, like concerts or sporting events. . Carefully consider travel plans you have or are making. . If you are planning any travel outside or inside the [place], visit the CDC's Travelers' Health webpage for the latest health notices. . If you have some symptoms but not all symptoms, continue to monitor at home and seek medical attention if your symptoms worsen. . If you are having a medical emergency, call 911.   ADDITIONAL HEALTHCARE OPTIONS FOR PATIENTS  [place] [facility] / e-Visit: [email or web address]         [facility]: [phone]  [facility]: [phone]                   [facility]: [phone]   

## 2019-08-10 NOTE — Progress Notes (Signed)
Radiation Oncology         (336) [phone] ________________________________  Name: Austin Oconnor MRN: [id number]  Date: 08/10/2019  DOB: 04/08/1941  UX:[id number], Ronie Spies, MD  [name],*   REFERRING PHYSICIAN: Dr. Franchot Gallo  DIAGNOSIS: 78 y.o. 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     ICD-10-CM   1. 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  In summary, he was initially diagnosed with Gleason 4+5 metastatic adenocarcinoma of the prostate with PSA of 15.01 in September 2015 with Dr. Lowella Bandy. At the time of diagnosis, a bone scan on 09/30/2014 revealed evidence of osseous metastatic disease in the intertrochanteric region of the left femur and an MRI of the pelvis on 10/24/2014 confirmed metastatic involvement in the left intertrochanteric femur and multiple other metastases within the pelvis. The patient started ADT on 11/28/2014 with excellent response and PSA nadir down to 0.13 in July 2017. Delton See was started on 06/06/2015. 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 The patient initially presented to our clinic in March 2017 for consideration of palliative radiation treatment to the left hip to reduce fracture risk and this was completed in April 2017 and was well tolerated.  [name] was added to his ADT treatment in 01/2017 due to rising PSA, up to 2.07 in February 2018, despite castrate levels of testoterone. His PSA dropped to 0.3 in April 2018 and then remained <0.1 until May 2019, when it jumped back up to 0.5. Unfortunately, the PSA continued to rise and reached 75.6 in February 2020. A restaging CT abdomen/pelvis did not show any evidence of visceral  metastases. However, bone scan on 12/15/2018 confirmed widespread, osseous metastatic disease, markedly progressive since prior stable exams. Metastatic sites included the sternum, bilateral scapula, bilateral anterior and posterior ribs, thoracic and lumbar spine, pelvis, bilateral proximal femora, and suspected involvement in the left humerus. Fortunately, he was not having any focal bony pain associated with the diffuse bony metastases at that time. At that point, his Fabio Asa was discontinued due to progression of disease and he was switched to [facility], but this was also discontinued after one treatment due to poor tolerance.   He was last seen in our office on 02/16/2019 and was started on Xofigo treatments to help better manage/control his progressive disease. Unfortunately, this was discontinued following his third injection due to persistent decline in his hemoglobin levels. Since that time, his PSA has continued to rise to 357 on 04/27/2019, up from 103 on 01/13/2019, and his pain has worsened in the left buttock with radiation into the LLE to the level of his knee.  He has also begun to notice occasional pain in the right hip/groin which remains tolerable but is new over the past 2 months.  He saw Dr. Diona Fanti in follow up on 07/21/2019, and was started on Alendronate in place of Xgeva due to the patient's worsening leg pain. He has also continued on ADT with his most recent injection of Trelstar (6 month) given 04/16/2019.  Dr. Shirley Muscat ordered repeat disease staging studies to include CT A/P and bone scan. These studies were performed on 08/03/2019 with the bone scan demonstrating marked progression of widespread osseous metastatic disease as compared to his prior exam in 12/2018  with significant increased activity most notable in the intertrochanteric femur on the right as well as increased activity in the sacrum.  The CT/P unfortunately demonstrated new metastatic disease involving the liver, with the largest  lesion measuring 2.4 cm and multiple pulmonary nodules in addition to the progression of disease in the skeleton.  He has a scheduled follow up visit with Dr. Alen Blew on 08/27/19 to further discuss systemic treatment options. In the interim, he has been referred back to [place] today, at the request of Dr. Diona Fanti, for discussion and consideration of palliative radiotherapy to the right hip to reduce the risk for pathologic fracture.   PREVIOUS RADIATION THERAPY: Yes   02/19/2016 to 03/01/2016:  The Left femur met was treated to 30 Gy in 10 fractions at 3 Gy per fraction.  PAST MEDICAL HISTORY:  Past Medical History:  Diagnosis Date  . At risk for sleep apnea    STOP-BANG= 4      . Elevated PSA   . Hypertension   . Nocturia   . Prostate cancer ([place])   . Wears partial dentures       PAST SURGICAL HISTORY: Past Surgical History:  Procedure Laterality Date  . INGUINAL HERNIA REPAIR Bilateral 1990  &  1960s  . LUMBAR SPINE SURGERY  age 36's  . PROSTATE BIOPSY N/A 09/16/2014   Procedure: BIOPSY TRANSRECTAL ULTRASONIC PROSTATE (TUBP);  Surgeon: Arvil Persons, MD;  Location: [facility];  Service: Urology;  Laterality: N/A;    FAMILY HISTORY:  Family History  Problem Relation Age of Onset  . Prostate cancer Brother   . Cancer Neg Hx   . Breast cancer Neg Hx   . Colon cancer Neg Hx   . Pancreatic cancer Neg Hx     SOCIAL HISTORY:  Social History   Socioeconomic History  . Marital status: Widowed    Spouse name: Not on file  . Number of children: 3  . Years of education: Not on file  . Highest education level: Not on file  Occupational History  . Not on file  Social Needs  . Financial resource strain: Not on file  . Food insecurity    Worry: Not on file    Inability: Not on file  . Transportation needs    Medical: Not on file    Non-medical: Not on file  Tobacco Use  . Smoking status: Never Smoker  . Smokeless tobacco: Never Used  Substance and Sexual Activity   . Alcohol use: No  . Drug use: No  . Sexual activity: Not Currently  Lifestyle  . Physical activity    Days per week: Not on file    Minutes per session: Not on file  . Stress: Not on file  Relationships  . Social Herbalist on phone: Not on file    Gets together: Not on file    Attends religious service: Not on file    Active member of club or organization: Not on file    Attends meetings of clubs or organizations: Not on file    Relationship status: Not on file  . Intimate partner violence    Fear of current or ex partner: Not on file    Emotionally abused: Not on file    Physically abused: Not on file    Forced sexual activity: Not on file  Other Topics Concern  . Not on file  Social History Narrative   3 children but one passed  ALLERGIES: Patient has no known allergies.  MEDICATIONS:  Current Outpatient Medications  Medication Sig Dispense Refill  . Cholecalciferol (VITAMIN D3) 1000 UNITS CAPS Take 1 capsule by mouth daily.    . [employer] ATHLETES FOOT 1 % cream Apply 1 application topically daily as needed.    . CVS EAR DROPS 6.5 % OTIC solution 5 drops. Into aff ear 2x daily for 10 days    . lisinopril (PRINIVIL,ZESTRIL) 10 MG tablet Take 1 tablet by mouth daily.    . Multiple Vitamin (MULTIVITAMIN) tablet Take 1 tablet by mouth daily.    . vitamin B-12 (CYANOCOBALAMIN) 1000 MCG tablet Take 1,000 mcg by mouth daily.    Marland Kitchen HYDROcodone-acetaminophen (NORCO) 5-325 MG tablet Take 1 tablet by mouth every 6 (six) hours as needed for moderate pain. 60 tablet 0   No current facility-administered medications for this encounter.     REVIEW OF SYSTEMS:  On review of systems, the patient reports that he is doing well overall. He denies any chest pain, shortness of breath, cough, fevers, chills, night sweats, or unintended weight changes. He has had decreased appetite but denies any bowel or bladder disturbances, and denies abdominal pain, nausea or vomiting. He is forcing  himself to eat 3 meals a day to maintain his weight. He reports severe left leg pain which starts in the buttock and radiates to the left knee, rated 10/10 despite using pain medications regularly. He also reports occasional pains in the right groin with activity, rated 4/10 in severity.  He denies paraesthesias or focal weakness in the lower extremities.  A complete review of systems is obtained and is otherwise negative.  PHYSICAL EXAM:  In general this is a well appearing African American male in no acute distress. He's alert and oriented x4 and appropriate throughout the examination. Cardiopulmonary assessment is negative for acute distress and he exhibits normal effort.   Wt Readings from Last 3 Encounters:  08/10/19 150 lb 3.2 oz (68.1 kg)  04/27/19 156 lb 8 oz (71 kg)  02/16/19 159 lb (72.1 kg)   Temp Readings from Last 3 Encounters:  08/10/19 98.3 F (36.8 C) (Temporal)  04/27/19 97.8 F (36.6 C) (Oral)  01/21/19 98.3 F (36.8 C) (Oral)   BP Readings from Last 3 Encounters:  08/10/19 132/71  04/27/19 (!) 142/86  01/21/19 (!) 153/99   Pulse Readings from Last 3 Encounters:  08/10/19 68  04/27/19 97  01/21/19 89   Pain Assessment Pain Score: 10-Worst pain ever Pain Frequency: Constant Pain Loc: Hip/10   LABORATORY DATA:  Lab Results  Component Value Date   WBC 4.4 06/01/2019   HGB 8.4 (L) 06/01/2019   HCT 26.4 (L) 06/01/2019   MCV 88.0 06/01/2019   PLT 149 (L) 06/01/2019   Lab Results  Component Value Date   NA 139 04/27/2019   K 4.7 04/27/2019   CL 103 04/27/2019   CO2 26 04/27/2019   Lab Results  Component Value Date   ALT 17 04/27/2019   AST 29 04/27/2019   ALKPHOS 97 04/27/2019   BILITOT 0.3 04/27/2019     RADIOGRAPHY: Nm Bone Scan Whole Body  Result Date: 08/04/2019 CLINICAL DATA:  History of metastatic prostate cancer. Elevated PSA. EXAM: NUCLEAR MEDICINE WHOLE BODY BONE SCAN TECHNIQUE: Whole body anterior and posterior images were obtained  approximately 3 hours after intravenous injection of radiopharmaceutical. RADIOPHARMACEUTICALS:  21.8 mCi Technetium-29mMDP IV COMPARISON:  Whole-body bone scan 12/15/2018 and 01/26/2018. CT abdomen and pelvis 12/15/2018. FINDINGS: Multiple sites  of abnormal radiotracer uptake are identified. The appearance is worse than on the most recent bone scan with increased activity most notable in the intertrochanteric femur on the right, right superior and inferior pubic rami, right ilium, lower lumbar spine, innumerable ribs, sternum and proximal humeri bilaterally. Activity in the sacrum is also more intense than on the prior exam. The kidneys are barely perceptible. IMPRESSION: Marked progression of widespread osseous metastatic disease since the most recent examination. Electronically Signed   By: Inge Rise M.D.   On: 08/04/2019 08:45      IMPRESSION/PLAN: This visit was conducted via in-office WebEx to spare the patient unnecessary potential exposure in the healthcare setting during the current COVID-19 pandemic.  1. 78 y.o. 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  Today, I talked to the patient about the findings and workup thus far. Dr. Tammi Klippel has personally reviewed his recent imaging and has recommended a 2 week course of palliative radiotherapy to the right hip and left sacral metastases delivered in 10 daily daily treatments. He is familiar with radiation treatment due to his previous treatment to the left hip but we reviewed the details of logistics and delivery as well as the anticipated acute and late sequelae associated with radiation in this setting. The patient was encouraged to ask questions that were answered to his stated satisfaction.  At the end of our conversation, the patient elects to proceed with palliative radiation to the sacrum and right hip. He appears to have a good understanding of his  disease and our recommendations for treatment of palliative intent.  He has freely signed written consent to proceed today in the office and will undergo CT simulation following our visit today in anticipation of beginning his treatments on Thursday 08/12/19.  We will share this discussion with Dr. Diona Fanti and Dr. Alen Blew and proceed with treatment accordingly.  He will keep his scheduled follow up with Dr. Alen Blew on 08/27/19 for further discussion of systemic therapy options.    Nicholos Johns, PA-C    Tyler Pita, MD  [facility] Direct Dial: [phone]  Fax: [phone] [email or web address]  Skype  [email or web address]  This document serves as a record of services personally performed by Tyler Pita, MD and Freeman Caldron, PA-C. It was created on their behalf by Wilburn Mylar, a trained medical scribe. The creation of this record is based on the scribe's personal observations and the provider's statements to them. This document has been checked and approved by the attending provider.

## 2019-08-10 NOTE — Progress Notes (Signed)
Patient in for consult for metastatic bone mets from prostate cancer. Complains of severe hip pain. States he needs an Rx for pain medication.

## 2019-08-12 ENCOUNTER — Other Ambulatory Visit: Payer: Self-pay

## 2019-08-12 ENCOUNTER — Ambulatory Visit
Admission: RE | Admit: 2019-08-12 | Discharge: 2019-08-12 | Disposition: A | Discharge status: Home / Self Care | Payer: Medicare Other | Source: Ambulatory Visit | Attending: Radiation Oncology | Admitting: Radiation Oncology

## 2019-08-12 ENCOUNTER — Telehealth: Payer: Self-pay | Admitting: Oncology

## 2019-08-12 DIAGNOSIS — C7951 Secondary malignant neoplasm of bone: Secondary | ICD-10-CM | POA: Diagnosis present

## 2019-08-12 NOTE — Progress Notes (Signed)
  Radiation Oncology         (336) [phone] ________________________________  Name: Austin Oconnor MRN: [id number]  Date: 08/10/2019  DOB: Apr 01, 1941  SIMULATION AND TREATMENT PLANNING NOTE    ICD-10-CM   1. Bone metastasis ([place])  C79.51     DIAGNOSIS:  78 yo man with castration resistant stage IV prostate cancer with painful metastases to the right proximal femur and left sacrum  NARRATIVE:  The patient was brought to the [facility].  Identity was confirmed.  All relevant records and images related to the planned course of therapy were reviewed.  The patient freely provided informed written consent to proceed with treatment after reviewing the details related to the planned course of therapy. The consent form was witnessed and verified by the simulation staff.  Then, the patient was set-up in a stable reproducible  supine position for radiation therapy.  CT images were obtained.  Surface markings were placed.  The CT images were loaded into the planning software.  Then the target and avoidance structures were contoured.  Treatment planning then occurred.  The radiation prescription was entered and confirmed.  Then, I designed and supervised the construction of a total of 5 medically necessary complex treatment devices consisting of leg positioner and MLC apertures to cover the treated hip area.  I have requested : 3D Simulation  I have requested a DVH of the following structures: Rectum, Bladder, femoral heads and target.  PLAN:  The patient will receive 30 Gy in 10 fractions.  ________________________________  Sheral Apley Tammi Klippel, M.D.

## 2019-08-12 NOTE — Telephone Encounter (Signed)
Scheduled appt per 9/30 sch message - pt aware of appt date and time

## 2019-08-13 ENCOUNTER — Other Ambulatory Visit: Payer: Self-pay

## 2019-08-13 ENCOUNTER — Ambulatory Visit
Admission: RE | Admit: 2019-08-13 | Discharge: 2019-08-13 | Disposition: A | Discharge status: Home / Self Care | Payer: Medicare Other | Source: Ambulatory Visit | Attending: Radiation Oncology | Admitting: Radiation Oncology

## 2019-08-13 DIAGNOSIS — C7951 Secondary malignant neoplasm of bone: Secondary | ICD-10-CM | POA: Diagnosis not present

## 2019-08-16 ENCOUNTER — Other Ambulatory Visit: Payer: Self-pay

## 2019-08-16 ENCOUNTER — Ambulatory Visit
Admission: RE | Admit: 2019-08-16 | Discharge: 2019-08-16 | Disposition: A | Discharge status: Home / Self Care | Payer: Medicare Other | Source: Ambulatory Visit | Attending: Radiation Oncology | Admitting: Radiation Oncology

## 2019-08-16 ENCOUNTER — Inpatient Hospital Stay: Payer: Medicare Other | Attending: Oncology | Admitting: Oncology

## 2019-08-16 VITALS — BP 128/67 | HR 85 | Temp 97.4°F | Resp 18 | Ht 68.0 in | Wt 152.7 lb

## 2019-08-16 DIAGNOSIS — Z7189 Other specified counseling: Secondary | ICD-10-CM | POA: Insufficient documentation

## 2019-08-16 DIAGNOSIS — G893 Neoplasm related pain (acute) (chronic): Secondary | ICD-10-CM | POA: Diagnosis not present

## 2019-08-16 DIAGNOSIS — C7951 Secondary malignant neoplasm of bone: Secondary | ICD-10-CM | POA: Diagnosis not present

## 2019-08-16 DIAGNOSIS — D630 Anemia in neoplastic disease: Secondary | ICD-10-CM | POA: Insufficient documentation

## 2019-08-16 DIAGNOSIS — R102 Pelvic and perineal pain: Secondary | ICD-10-CM | POA: Insufficient documentation

## 2019-08-16 DIAGNOSIS — D61818 Other pancytopenia: Secondary | ICD-10-CM | POA: Diagnosis not present

## 2019-08-16 DIAGNOSIS — C61 Malignant neoplasm of prostate: Secondary | ICD-10-CM

## 2019-08-16 DIAGNOSIS — E291 Testicular hypofunction: Secondary | ICD-10-CM | POA: Insufficient documentation

## 2019-08-16 DIAGNOSIS — C787 Secondary malignant neoplasm of liver and intrahepatic bile duct: Secondary | ICD-10-CM | POA: Insufficient documentation

## 2019-08-16 DIAGNOSIS — M79606 Pain in leg, unspecified: Secondary | ICD-10-CM | POA: Insufficient documentation

## 2019-08-16 DIAGNOSIS — Z923 Personal history of irradiation: Secondary | ICD-10-CM | POA: Diagnosis not present

## 2019-08-16 MED ORDER — LIDOCAINE-PRILOCAINE 2.5-2.5 % EX CREA: 1.0000 "application " | TOPICAL_CREAM | CUTANEOUS | 0 refills | Status: DC | PRN

## 2019-08-16 MED ORDER — PROCHLORPERAZINE MALEATE 10 MG PO TABS: 10.0000 mg | ORAL_TABLET | Freq: Four times a day (QID) | ORAL | 0 refills | Status: DC | PRN

## 2019-08-16 NOTE — Progress Notes (Signed)
START ON PATHWAY REGIMEN - Prostate     A cycle is every 21 days:     Docetaxel      Prednisone   **Always confirm dose/schedule in your pharmacy ordering system**  Patient Characteristics: Adenocarcinoma, Distant Metastases, Castration Resistant, Symptomatic, Docetaxel Eligible Histology: Adenocarcinoma Therapeutic Status: Distant Metastases  Intent of Therapy: Non-Curative / Palliative Intent, Discussed with Patient 

## 2019-08-16 NOTE — Progress Notes (Signed)
Hematology and Oncology Follow Up Visit  Austin Oconnor [id number] Mar 28, 1941 78 y.o. 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  Prior Therapy:  He is status post radiation therapy to the area under the care of Dr. Tammi Klippel between 02/19/2016 and 03/01/2016.   He was treated with Lupron under the care of Dr. Luberta Robertson every 4 months.   His PSA was up to 0.49 in November 2017 and in February 2018 was 2.07 his testosterone was adequately castrate at that time.  Indicating castration resistant disease.   [name] 1000 mg daily with prednisone 5 mg daily started in March 2018.  Therapy discontinued in February 2020 due to progression of disease.  He was started on Xtandi which was discontinued after poor tolerance in March 2020.   Trudi Ida on a monthly basis started in May 2020.  Therapy discontinued after April 13, 2019 because of progression of disease.  Current therapy: Palliative radiation therapy to the right proximal femur and sacrum.  Interim History: Austin Oconnor is here for a follow-up visit.  Since the last visit, he has started developing leg and pelvic pain and currently receiving palliative radiation therapy after imaging studies showed progression of disease in those areas.  His bone scan on 08/03/2019 showed a rapid progression of disease.  CT scan of the abdomen and pelvis showed multiple hepatic lesions with the largest measuring 2.4 x 2.4 cm.  Clinically, he has tolerated radiation reasonably well although he does report some occasional nausea but no vomiting.  His appetite has decreased slightly and lost 3 pounds in the last 3 months.  He does use hydrocodone for his back pain which is improved at this time.  He still ambulates and attends to activities of daily  living without any decline.  He continues to live independently.    Patient denied any alteration mental status, neuropathy, confusion or dizziness.  Denies any headaches or lethargy.  Denies any night sweats, weight loss or changes in appetite.  Denied orthopnea, dyspnea on exertion or chest discomfort.  Denies shortness of breath, difficulty breathing hemoptysis or cough.  Denies any abdominal distention,  early satiety or dyspepsia.  Denies any hematuria, frequency, dysuria or nocturia.  Denies any skin irritation, dryness or rash.  Denies any ecchymosis or petechiae.  Denies any lymphadenopathy or clotting.  Denies any heat or cold intolerance.  Denies any anxiety or depression.  Remaining review of system is negative.              Medications: Updated today on review. Current Outpatient Medications  Medication Sig Dispense Refill  . Cholecalciferol (VITAMIN D3) 1000 UNITS CAPS Take 1 capsule by mouth daily.    . [employer] ATHLETES FOOT 1 % cream Apply 1 application topically daily as needed.    . [employer] EAR DROPS 6.5 % OTIC solution 5 drops. Into aff ear 2x daily for 10 days    . HYDROcodone-acetaminophen (NORCO) 5-325 MG tablet Take 1 tablet by mouth every 6 (six) hours as needed for moderate pain. 60 tablet 0  . lisinopril (PRINIVIL,ZESTRIL) 10 MG tablet Take 1 tablet by mouth daily.    . Multiple Vitamin (MULTIVITAMIN) tablet Take 1 tablet by mouth daily.    . vitamin B-12 (CYANOCOBALAMIN) 1000 MCG tablet Take 1,000 mcg by mouth daily.     No  current facility-administered medications for this visit.      Allergies: No Known Allergies  Past Medical History, Surgical history, Social history, and Family History without any changes on review.   Physical Exam:     Blood pressure 128/67, pulse 85, temperature (!) 97.4 F (36.3 C), temperature source Oral, resp. rate 18, height 5\' 8"  (1.727 m), weight 152 lb 11.2 oz (69.3 kg), SpO2 100 %.     ECOG: 0    General  appearance: Comfortable appearing without any discomfort Head: Normocephalic without any trauma Oropharynx: Mucous membranes are moist and pink without any thrush or ulcers. Eyes: Pupils are equal and round reactive to light. Lymph nodes: No cervical, supraclavicular, inguinal or axillary lymphadenopathy.   Heart:regular rate and rhythm.  S1 and S2 without leg edema. Lung: Clear without any rhonchi or wheezes.  No dullness to percussion. Abdomin: Soft, nontender, nondistended with good bowel sounds.  No hepatosplenomegaly. Musculoskeletal: No joint deformity or effusion.  Full range of motion noted. Neurological: No deficits noted on motor, sensory and deep tendon reflex exam. Skin: No petechial rash or dryness.  Appeared moist.       Lab Results: Lab Results  Component Value Date   WBC 4.4 06/01/2019   HGB 8.4 (L) 06/01/2019   HCT 26.4 (L) 06/01/2019   MCV 88.0 06/01/2019   PLT 149 (L) 06/01/2019     Chemistry      Component Value Date/Time   NA 139 04/27/2019 0901   NA 143 09/16/2017 0805   K 4.7 04/27/2019 0901   K 3.4 (L) 09/16/2017 0805   CL 103 04/27/2019 0901   CO2 26 04/27/2019 0901   CO2 29 09/16/2017 0805   BUN 15 04/27/2019 0901   BUN 9.1 09/16/2017 0805   CREATININE 0.84 04/27/2019 0901   CREATININE 0.8 09/16/2017 0805      Component Value Date/Time   CALCIUM 8.6 (L) 04/27/2019 0901   CALCIUM 8.4 09/16/2017 0805   ALKPHOS 97 04/27/2019 0901   ALKPHOS 53 09/16/2017 0805   AST 29 04/27/2019 0901   AST 26 09/16/2017 0805   ALT 17 04/27/2019 0901   ALT 19 09/16/2017 0805   BILITOT 0.3 04/27/2019 0901   BILITOT 0.64 09/16/2017 0805             Impression and Plan:  78 year old man with:  1.  Advanced prostate cancer with disease to the bone that is currently castration-resistant.   He has progressed on multiple therapies outlined above including 2 months of Xofigo with painful bone metastasis despite current treatment.  He has developed  hepatic metastasis as well as progression of disease in the bone.  The natural course of this disease was reviewed today in detail and treatment options were reiterated.  Given the rapid progression of his disease as well as hepatic metastasis, I am in favor of proceeding with systemic chemotherapy at this time.  Risks and benefits of Taxotere chemotherapy was discussed.  Complications that include nausea, vomiting, myelosuppression, neuropathy as well as infusion related complications were reviewed.  The benefit would include palliating his disease including improving his pain, improving progression free survival as well as overall survival.  After discussion today he is agreeable to proceed and the plan is to proceed with therapy after completing radiation for a total of 10 cycles.  We will proceed after completing radiation therapy and education class.    2. Androgen deprivation: I recommended continuing this therapy indefinitely under the care of Dr. Diona Fanti.  3.  Bone directed therapy: He is currently on Xgeva under the care of Dr. Nolon Bussing studies and I recommended continuing this for the time being.  4.  IV access: Risks and benefits of a Port-A-Cath insertion was reviewed today.  Potential complications that include bleeding, thrombosis and infection were discussed.  He is agreeable to have that done prior to ministration of chemotherapy.  5.  Goals of care and prognosis: Therapy remains palliative at this time.  He understands he has an incurable malignancy that is progressing.  His performance status is excellent although this can change if treatment does not commence soon.  He understands that even with chemotherapy his disease will likely progress in the future.  6.  Antiemetics: Prescription for Compazine will be made available to him.  7.  Bone pain: Related to prostate cancer in the bone.  He has hydrocodone with pain is manageable.  Anticipate improvement after completing radiation  therapy.  8.  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  We will check counts prior to the start of chemotherapy.  9. Follow-up: In the near future for the start of chemotherapy.  40  minutes was spent with the patient face-to-face today.  More than 50% of time was dedicated to reviewing imaging studies, treatment options, complication related therapy, managing complications related to cancer and answering questions regarding future plan of care.    Zola Button, MD 10/5/20209:53 AM

## 2019-08-17 ENCOUNTER — Ambulatory Visit
Admission: RE | Admit: 2019-08-17 | Discharge: 2019-08-17 | Disposition: A | Discharge status: Home / Self Care | Payer: Medicare Other | Source: Ambulatory Visit | Attending: Radiation Oncology | Admitting: Radiation Oncology

## 2019-08-17 ENCOUNTER — Other Ambulatory Visit: Payer: Self-pay

## 2019-08-17 DIAGNOSIS — C7951 Secondary malignant neoplasm of bone: Secondary | ICD-10-CM | POA: Diagnosis not present

## 2019-08-18 ENCOUNTER — Ambulatory Visit
Admission: RE | Admit: 2019-08-18 | Discharge: 2019-08-18 | Disposition: A | Discharge status: Home / Self Care | Payer: Medicare Other | Source: Ambulatory Visit | Attending: Radiation Oncology | Admitting: Radiation Oncology

## 2019-08-18 ENCOUNTER — Other Ambulatory Visit: Payer: Self-pay

## 2019-08-18 DIAGNOSIS — C7951 Secondary malignant neoplasm of bone: Secondary | ICD-10-CM | POA: Diagnosis not present

## 2019-08-19 ENCOUNTER — Telehealth: Payer: Self-pay | Admitting: Oncology

## 2019-08-19 ENCOUNTER — Other Ambulatory Visit: Payer: Self-pay

## 2019-08-19 ENCOUNTER — Ambulatory Visit
Admission: RE | Admit: 2019-08-19 | Discharge: 2019-08-19 | Disposition: A | Discharge status: Home / Self Care | Payer: Medicare Other | Source: Ambulatory Visit | Attending: Radiation Oncology | Admitting: Radiation Oncology

## 2019-08-19 DIAGNOSIS — C7951 Secondary malignant neoplasm of bone: Secondary | ICD-10-CM | POA: Diagnosis not present

## 2019-08-19 NOTE — Telephone Encounter (Signed)
Called and spoke with patient. Confirmed appts  °

## 2019-08-20 ENCOUNTER — Other Ambulatory Visit: Payer: Self-pay | Admitting: Radiation Oncology

## 2019-08-20 ENCOUNTER — Other Ambulatory Visit: Payer: Self-pay

## 2019-08-20 ENCOUNTER — Ambulatory Visit
Admission: RE | Admit: 2019-08-20 | Discharge: 2019-08-20 | Disposition: A | Discharge status: Home / Self Care | Payer: Medicare Other | Source: Ambulatory Visit | Attending: Radiation Oncology | Admitting: Radiation Oncology

## 2019-08-20 DIAGNOSIS — C7951 Secondary malignant neoplasm of bone: Secondary | ICD-10-CM | POA: Diagnosis not present

## 2019-08-23 ENCOUNTER — Ambulatory Visit
Admission: RE | Admit: 2019-08-23 | Discharge: 2019-08-23 | Disposition: A | Discharge status: Home / Self Care | Payer: Medicare Other | Source: Ambulatory Visit | Attending: Radiation Oncology | Admitting: Radiation Oncology

## 2019-08-23 ENCOUNTER — Other Ambulatory Visit: Payer: Self-pay

## 2019-08-23 DIAGNOSIS — C7951 Secondary malignant neoplasm of bone: Secondary | ICD-10-CM | POA: Diagnosis not present

## 2019-08-24 ENCOUNTER — Ambulatory Visit
Admission: RE | Admit: 2019-08-24 | Discharge: 2019-08-24 | Disposition: A | Discharge status: Home / Self Care | Payer: Medicare Other | Source: Ambulatory Visit | Attending: Radiation Oncology | Admitting: Radiation Oncology

## 2019-08-24 ENCOUNTER — Other Ambulatory Visit: Payer: Self-pay

## 2019-08-24 DIAGNOSIS — C7951 Secondary malignant neoplasm of bone: Secondary | ICD-10-CM | POA: Diagnosis not present

## 2019-08-25 ENCOUNTER — Inpatient Hospital Stay: Payer: Medicare Other

## 2019-08-25 ENCOUNTER — Other Ambulatory Visit: Payer: Self-pay

## 2019-08-25 ENCOUNTER — Ambulatory Visit
Admission: RE | Admit: 2019-08-25 | Discharge: 2019-08-25 | Disposition: A | Discharge status: Home / Self Care | Payer: Medicare Other | Source: Ambulatory Visit | Attending: Radiation Oncology | Admitting: Radiation Oncology

## 2019-08-25 ENCOUNTER — Other Ambulatory Visit: Payer: Self-pay | Admitting: Radiology

## 2019-08-25 ENCOUNTER — Encounter: Payer: Self-pay | Admitting: Radiation Oncology

## 2019-08-25 DIAGNOSIS — C7951 Secondary malignant neoplasm of bone: Secondary | ICD-10-CM | POA: Diagnosis not present

## 2019-08-26 ENCOUNTER — Ambulatory Visit (HOSPITAL_COMMUNITY)
Admission: RE | Admit: 2019-08-26 | Discharge: 2019-08-26 | Disposition: A | Discharge status: Home / Self Care | Payer: Medicare Other | Source: Ambulatory Visit | Attending: Oncology | Admitting: Oncology

## 2019-08-26 ENCOUNTER — Other Ambulatory Visit: Payer: Self-pay | Admitting: Oncology

## 2019-08-26 ENCOUNTER — Encounter (HOSPITAL_COMMUNITY): Payer: Self-pay

## 2019-08-26 ENCOUNTER — Observation Stay (HOSPITAL_COMMUNITY)
Admission: RE | Admit: 2019-08-26 | Discharge: 2019-08-26 | Disposition: A | Discharge status: Home / Self Care | Payer: Medicare Other | Source: Ambulatory Visit | Attending: Radiation Oncology | Admitting: Radiation Oncology

## 2019-08-26 DIAGNOSIS — R351 Nocturia: Secondary | ICD-10-CM | POA: Diagnosis not present

## 2019-08-26 DIAGNOSIS — C61 Malignant neoplasm of prostate: Secondary | ICD-10-CM | POA: Insufficient documentation

## 2019-08-26 DIAGNOSIS — I10 Essential (primary) hypertension: Secondary | ICD-10-CM | POA: Diagnosis not present

## 2019-08-26 DIAGNOSIS — Z79899 Other long term (current) drug therapy: Secondary | ICD-10-CM | POA: Diagnosis not present

## 2019-08-26 DIAGNOSIS — Z049 Encounter for examination and observation for unspecified reason: Secondary | ICD-10-CM | POA: Insufficient documentation

## 2019-08-26 DIAGNOSIS — Z7901 Long term (current) use of anticoagulants: Secondary | ICD-10-CM | POA: Diagnosis not present

## 2019-08-26 LAB — CBC WITH DIFFERENTIAL/PLATELET
Abs Immature Granulocytes: 0.26 10*3/uL — ABNORMAL HIGH (ref 0.00–0.07)
Basophils Absolute: 0.1 10*3/uL (ref 0.0–0.1)
Basophils Relative: 1 %
Eosinophils Absolute: 0 10*3/uL (ref 0.0–0.5)
Eosinophils Relative: 1 %
HCT: 21.1 % — ABNORMAL LOW (ref 39.0–52.0)
Hemoglobin: 6.5 g/dL — CL (ref 13.0–17.0)
Immature Granulocytes: 7 %
Lymphocytes Relative: 29 %
Lymphs Abs: 1 10*3/uL (ref 0.7–4.0)
MCH: 28.9 pg (ref 26.0–34.0)
MCHC: 30.8 g/dL (ref 30.0–36.0)
MCV: 93.8 fL (ref 80.0–100.0)
Monocytes Absolute: 0.8 10*3/uL (ref 0.1–1.0)
Monocytes Relative: 22 %
Neutro Abs: 1.4 10*3/uL — ABNORMAL LOW (ref 1.7–7.7)
Neutrophils Relative %: 40 %
Platelets: 50 10*3/uL — ABNORMAL LOW (ref 150–400)
RBC: 2.25 MIL/uL — ABNORMAL LOW (ref 4.22–5.81)
RDW: 20.2 % — ABNORMAL HIGH (ref 11.5–15.5)
WBC: 3.6 10*3/uL — ABNORMAL LOW (ref 4.0–10.5)
nRBC: 24.6 % — ABNORMAL HIGH (ref 0.0–0.2)

## 2019-08-26 LAB — BASIC METABOLIC PANEL
Anion gap: 12 (ref 5–15)
BUN: 14 mg/dL (ref 8–23)
CO2: 22 mmol/L (ref 22–32)
Calcium: 8.3 mg/dL — ABNORMAL LOW (ref 8.9–10.3)
Chloride: 103 mmol/L (ref 98–111)
Creatinine, Ser: 0.82 mg/dL (ref 0.61–1.24)
GFR calc Af Amer: >60 mL/min (ref >60)
GFR calc non Af Amer: >60 mL/min (ref >60)
Glucose, Bld: 103 mg/dL — ABNORMAL HIGH (ref 70–99)
Potassium: 3.7 mmol/L (ref 3.5–5.1)
Sodium: 137 mmol/L (ref 135–145)

## 2019-08-26 LAB — PROTIME-INR
INR: 1 (ref 0.8–1.2)
Prothrombin Time: 12.9 seconds (ref 11.4–15.2)

## 2019-08-26 MED ORDER — CEFAZOLIN SODIUM-DEXTROSE 2-4 GM/100ML-% IV SOLN: 2.0000 g | INTRAVENOUS | Status: DC

## 2019-08-26 MED ORDER — SODIUM CHLORIDE 0.9 % IV SOLN
INTRAVENOUS | Status: DC
  Administered 2019-08-26: 13:00:00 via INTRAVENOUS

## 2019-08-26 MED ORDER — HYDROCODONE-ACETAMINOPHEN 5-325 MG PO TABS: 1.0000 | ORAL_TABLET | Freq: Four times a day (QID) | ORAL | 0 refills | Status: DC | PRN

## 2019-08-26 NOTE — Progress Notes (Signed)
Spoke with [employer], pt's ride, ride confirmed Updated her on time frame for procedure and recovery.

## 2019-08-26 NOTE — H&P (Addendum)
Chief Complaint: Patient was seen in consultation today for prostate cancer/Port-a-cath placement.  Referring Physician(s): Wyatt Portela  Supervising Physician: Aletta Edouard  Patient Status: [facility] - Out-pt  History of Present Illness: Austin Oconnor is a 78 y.o. male with a past medical history of hypertension, nocturia, and prostate cancer. He was unfortunately diagnosed with prostate cancer with disease into the bone in 2017. His cancer is managed by Dr. Alen Blew. He has completed multiple therapies for management, including radiation therapy (02/19/2016 to 03/01/2016) and s/p chemotherapy. He is currently undergoing palliative radiation therapy to the right proximal femur and sacrum. He has tentative plans to begin systemic chemotherapy.  IR consulted by Dr. Alen Blew for possible image-guided Port-a-cath placement. Patient awake and alert laying in bed watching TV with no complaints at this time. Denies fever, chills, chest pain, dyspnea, abdominal pain, or headache.   Past Medical History:  Diagnosis Date  . At risk for sleep apnea    STOP-BANG= 4      . Elevated PSA   . Hypertension   . Nocturia   . Prostate cancer ([place])   . Wears partial dentures     Past Surgical History:  Procedure Laterality Date  . INGUINAL HERNIA REPAIR Bilateral 1990  &  1960s  . LUMBAR SPINE SURGERY  age 88's  . PROSTATE BIOPSY N/A 09/16/2014   Procedure: BIOPSY TRANSRECTAL ULTRASONIC PROSTATE (TUBP);  Surgeon: Arvil Persons, MD;  Location: [facility];  Service: Urology;  Laterality: N/A;    Allergies: Patient has no known allergies.  Medications: Prior to Admission medications   Medication Sig Start Date End Date Taking? Authorizing Provider  Cholecalciferol (VITAMIN D3) 1000 UNITS CAPS Take 1 capsule by mouth daily.    [provider]  CVS ATHLETES FOOT 1 % cream Apply 1 application topically daily as needed. 10/14/18   [provider]  [employer]S EAR DROPS 6.5 %  OTIC solution 5 drops. Into aff ear 2x daily for 10 days 10/14/18   [provider]  HYDROcodone-acetaminophen (NORCO) 5-325 MG tablet Take 1 tablet by mouth every 6 (six) hours as needed for moderate pain. 08/10/19   Wyatt Portela, MD  lidocaine-prilocaine (EMLA) cream Apply 1 application topically as needed. 08/16/19   Wyatt Portela, MD  lisinopril (PRINIVIL,ZESTRIL) 10 MG tablet Take 1 tablet by mouth daily. 01/08/17   [provider]  Multiple Vitamin (MULTIVITAMIN) tablet Take 1 tablet by mouth daily.    [provider]  prochlorperazine (COMPAZINE) 10 MG tablet Take 1 tablet (10 mg total) by mouth every 6 (six) hours as needed for nausea or vomiting. 08/16/19   Wyatt Portela, MD  vitamin B-12 (CYANOCOBALAMIN) 1000 MCG tablet Take 1,000 mcg by mouth daily.    [provider]     Family History  Problem Relation Age of Onset  . Prostate cancer Brother   . Cancer Neg Hx   . Breast cancer Neg Hx   . Colon cancer Neg Hx   . Pancreatic cancer Neg Hx     Social History   Socioeconomic History  . Marital status: Widowed    Spouse name: Not on file  . Number of children: 3  . Years of education: Not on file  . Highest education level: Not on file  Occupational History  . Not on file  Social Needs  . Financial resource strain: Not on file  . Food insecurity    Worry: Not on file    Inability:  Not on file  . Transportation needs    Medical: Not on file    Non-medical: Not on file  Tobacco Use  . Smoking status: Never Smoker  . Smokeless tobacco: Never Used  Substance and Sexual Activity  . Alcohol use: No  . Drug use: No  . Sexual activity: Not Currently  Lifestyle  . Physical activity    Days per week: Not on file    Minutes per session: Not on file  . Stress: Not on file  Relationships  . Social Herbalist on phone: Not on file    Gets together: Not on file    Attends religious service: Not on file    Active member of  club or organization: Not on file    Attends meetings of clubs or organizations: Not on file    Relationship status: Not on file  Other Topics Concern  . Not on file  Social History Narrative   3 children but one passed     Review of Systems: A 12 point ROS discussed and pertinent positives are indicated in the HPI above.  All other systems are negative.  Review of Systems  Constitutional: Negative for chills and fever.  Respiratory: Negative for shortness of breath and wheezing.   Cardiovascular: Negative for chest pain and palpitations.  Gastrointestinal: Negative for abdominal pain.  Neurological: Negative for headaches.  Psychiatric/Behavioral: Negative for behavioral problems and confusion.    Vital Signs: BP (!) 156/90   Pulse 80   Temp 99.1 F (37.3 C) (Oral)   Resp 18   SpO2 100%   Physical Exam Vitals signs and nursing note reviewed.  Constitutional:      General: He is not in acute distress.    Appearance: Normal appearance.  Cardiovascular:     Rate and Rhythm: Normal rate and regular rhythm.     Heart sounds: Normal heart sounds. No murmur.  Pulmonary:     Effort: Pulmonary effort is normal. No respiratory distress.     Breath sounds: Normal breath sounds. No wheezing.  Skin:    General: Skin is warm and dry.  Neurological:     Mental Status: He is alert and oriented to person, place, and time.  Psychiatric:        Mood and Affect: Mood normal.        Behavior: Behavior normal.        Thought Content: Thought content normal.        Judgment: Judgment normal.      MD Evaluation Airway: WNL Heart: WNL Abdomen: WNL Chest/ Lungs: WNL ASA  Classification: 3 Mallampati/Airway Score: Two   Imaging: Nm Bone Scan Whole Body  Result Date: 08/04/2019 CLINICAL DATA:  History of metastatic prostate cancer. Elevated PSA. EXAM: NUCLEAR MEDICINE WHOLE BODY BONE SCAN TECHNIQUE: Whole body anterior and posterior images were obtained approximately 3 hours  after intravenous injection of radiopharmaceutical. RADIOPHARMACEUTICALS:  21.8 mCi Technetium-51m MDP IV COMPARISON:  Whole-body bone scan 12/15/2018 and 01/26/2018. CT abdomen and pelvis 12/15/2018. FINDINGS: Multiple sites of abnormal radiotracer uptake are identified. The appearance is worse than on the most recent bone scan with increased activity most notable in the intertrochanteric femur on the right, right superior and inferior pubic rami, right ilium, lower lumbar spine, innumerable ribs, sternum and proximal humeri bilaterally. Activity in the sacrum is also more intense than on the prior exam. The kidneys are barely perceptible. IMPRESSION: Marked progression of widespread osseous metastatic disease since the most recent  examination. Electronically Signed   By: Inge Rise M.D.   On: 08/04/2019 08:45    Labs:  CBC: Recent Labs    04/06/19 1140 04/27/19 0901 05/18/19 1231 06/01/19 0901  WBC 4.2 3.6* 4.7 4.4  HGB 10.6* 10.3* 8.8* 8.4*  HCT 33.8* 31.7* 27.0* 26.4*  PLT 223 191 210 149*    COAGS: Recent Labs    08/26/19 1304  INR 1.0    BMP: Recent Labs    12/19/18 1658 01/19/19 0809 04/27/19 0901 08/26/19 1304  NA 137 142 139 137  K 3.2* 4.3 4.7 3.7  CL 101 104 103 103  CO2 23 27 26 22   GLUCOSE 156* 102* 97 103*  BUN 17 15 15 14   CALCIUM 7.7* 8.3* 8.6* 8.3*  CREATININE 1.11 0.89 0.84 0.82  GFRNONAA >60 >60 >60 >60  GFRAA >60 >60 >60 >60    LIVER FUNCTION TESTS: Recent Labs    09/15/18 0853 12/15/18 0835 01/19/19 0809 04/27/19 0901  BILITOT 0.4 0.8 0.4 0.3  AST 21 27 18 29   ALT 15 17 13 17   ALKPHOS 74 97 88 97  PROT 7.0 6.9 6.9 7.0  ALBUMIN 3.8 3.6 3.3* 3.5     Assessment and Plan:  Prostate cancer with tentative plans to begin systemic chemotherapy. Plan for image-guided Port-a-cath placement today in IR. Patient is NPO. Afebrile. He does not take blood thinners. INR 1.0 today.  Risks and benefits of image guided port-a-catheter  placement were discussed with the patient including, but not limited to bleeding, infection, pneumothorax, or fibrin sheath development and need for additional procedures. All of the patient's questions were answered, patient is agreeable to proceed. Consent signed and in chart.   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 Discussed results with Dr. Kathlene Cote who recommends postponing procedure until labs stable. Dr. Alen Blew made aware, agrees with plan. Patient to go home today. Informed patient of above. Patient aware to call Dr. Danise Mina office regarding further management. All questions answered and concerns addressed.  Thank you for this interesting consult.  I greatly enjoyed meeting Austin Oconnor and look forward to participating in their care.  A copy of this report was sent to the requesting provider on this date.  Electronically Signed: Earley Abide, PA-C 08/26/2019, 1:52 PM   I spent a total of 40 Minutes in face to face in clinical consultation, greater than 50% of which was counseling/coordinating care for prostate cancer/Port-a-cath placement.

## 2019-08-26 NOTE — Progress Notes (Signed)
CRITICAL VALUE STICKER  CRITICAL VALUE: Hemoglobin 6.5  RECEIVER (on-site recipient of call): Burley Saver, RN   DATE & TIME NOTIFIED: 08/26/19 '@b'  14:00  MESSENGER (representative from lab): Nunzio Cory  MD NOTIFIED: Earley Abide, PA made aware  TIME OF NOTIFICATION: 14:02  RESPONSE: PA acknowledged critical value and will discuss with Dr. Kathlene Cote.

## 2019-08-27 ENCOUNTER — Other Ambulatory Visit: Payer: Self-pay

## 2019-08-27 ENCOUNTER — Telehealth: Payer: Self-pay

## 2019-08-27 ENCOUNTER — Inpatient Hospital Stay: Payer: Medicare Other

## 2019-08-27 ENCOUNTER — Telehealth: Payer: Self-pay | Admitting: *Deleted

## 2019-08-27 ENCOUNTER — Telehealth: Payer: Self-pay | Admitting: Oncology

## 2019-08-27 ENCOUNTER — Inpatient Hospital Stay (HOSPITAL_BASED_OUTPATIENT_CLINIC_OR_DEPARTMENT_OTHER): Payer: Medicare Other | Admitting: Oncology

## 2019-08-27 VITALS — BP 132/66 | HR 93 | Temp 97.8°F | Resp 18 | Ht 68.0 in | Wt 155.5 lb

## 2019-08-27 DIAGNOSIS — C61 Malignant neoplasm of prostate: Secondary | ICD-10-CM | POA: Diagnosis not present

## 2019-08-27 LAB — CBC WITH DIFFERENTIAL (CANCER CENTER ONLY)
Abs Immature Granulocytes: 0.19 10*3/uL — ABNORMAL HIGH (ref 0.00–0.07)
Basophils Absolute: 0 10*3/uL (ref 0.0–0.1)
Basophils Relative: 1 %
Eosinophils Absolute: 0 10*3/uL (ref 0.0–0.5)
Eosinophils Relative: 0 %
HCT: 17.4 % — ABNORMAL LOW (ref 39.0–52.0)
Hemoglobin: 5.6 g/dL — CL (ref 13.0–17.0)
Immature Granulocytes: 6 %
Lymphocytes Relative: 31 %
Lymphs Abs: 1 10*3/uL (ref 0.7–4.0)
MCH: 28.6 pg (ref 26.0–34.0)
MCHC: 32.2 g/dL (ref 30.0–36.0)
MCV: 88.8 fL (ref 80.0–100.0)
Monocytes Absolute: 0.5 10*3/uL (ref 0.1–1.0)
Monocytes Relative: 15 %
Neutro Abs: 1.4 10*3/uL — ABNORMAL LOW (ref 1.7–7.7)
Neutrophils Relative %: 47 %
Platelet Count: 42 10*3/uL — ABNORMAL LOW (ref 150–400)
RBC: 1.96 MIL/uL — ABNORMAL LOW (ref 4.22–5.81)
RDW: 20 % — ABNORMAL HIGH (ref 11.5–15.5)
WBC Count: 3.1 10*3/uL — ABNORMAL LOW (ref 4.0–10.5)
nRBC: 19.2 % — ABNORMAL HIGH (ref 0.0–0.2)

## 2019-08-27 LAB — CMP (CANCER CENTER ONLY)
ALT: 14 U/L (ref 0–44)
AST: 38 U/L (ref 15–41)
Albumin: 3.3 g/dL — ABNORMAL LOW (ref 3.5–5.0)
Alkaline Phosphatase: 388 U/L — ABNORMAL HIGH (ref 38–126)
Anion gap: 9 (ref 5–15)
BUN: 15 mg/dL (ref 8–23)
CO2: 23 mmol/L (ref 22–32)
Calcium: 8 mg/dL — ABNORMAL LOW (ref 8.9–10.3)
Chloride: 106 mmol/L (ref 98–111)
Creatinine: 0.72 mg/dL (ref 0.61–1.24)
GFR, Est AFR Am: >60 mL/min (ref >60)
GFR, Estimated: >60 mL/min (ref >60)
Glucose, Bld: 97 mg/dL (ref 70–99)
Potassium: 4 mmol/L (ref 3.5–5.1)
Sodium: 138 mmol/L (ref 135–145)
Total Bilirubin: 0.3 mg/dL (ref 0.3–1.2)
Total Protein: 5.9 g/dL — ABNORMAL LOW (ref 6.5–8.1)

## 2019-08-27 LAB — SAMPLE TO BLOOD BANK

## 2019-08-27 NOTE — Telephone Encounter (Addendum)
CRITICAL VALUE STICKER  CRITICAL VALUE: Hgb = 5.6.  RECEIVER (on-site recipient of call): Mining engineer, Triage [place].   DATE & TIME NOTIFIED: 08/27/2019 at 0953.   MESSENGER (representative from lab): Rosann Auerbach MT [facility].  MD NOTIFIED: Collaborative nurse.  TIME OF NOTIFICATION: 08/27/2019 at 1008.  RESPONSE: None. Patient currently in provider F/U visit.  Lab proceeding with order for "Hold for Blood Bank".

## 2019-08-27 NOTE — Progress Notes (Signed)
Hematology and Oncology Follow Up Visit  JAYVAUGHN KNIPPENBERG [id number] July 07, 1941 78 y.o. 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  He was initially diagnosed in 2015 with localized disease and Gleason score of 4+5 = 9.   Prior Therapy:  He is status post radiation therapy to the area under the care of Dr. Tammi Klippel between 02/19/2016 and 03/01/2016.   He was treated with Lupron under the care of Dr. Luberta Robertson every 4 months.   His PSA was up to 0.49 in November 2017 and in February 2018 was 2.07 his testosterone was adequately castrate at that time.  Indicating castration resistant disease.   [name] 1000 mg daily with prednisone 5 mg daily started in March 2018.  Therapy discontinued in February 2020 due to progression of disease.  He was started on Xtandi which was discontinued after poor tolerance in March 2020.   Trudi Ida on a monthly basis started in May 2020.  Therapy discontinued after April 13, 2019 because of progression of disease.  Status post radiation therapy to the proximal right femur and left sacrum completed in September 2020.   Current therapy: Under consideration to start systemic chemotherapy.  Interim History: Mr. Salib is here for a repeat evaluation.  Since the last visit, he was scheduled to have a Port-A-Cath insertion that was not performed because of cytopenias.  He was scheduled to start systemic chemotherapy in the near future.  He denies any specific symptoms or complaints at this time.  He denies any shortness of breath, dyspnea on exertion or excessive fatigue.  He does report thigh pain which has improved after radiation.   He denied headaches, blurry vision, syncope or seizures.  Denies any fevers, chills or sweats.  Denied chest pain, palpitation, orthopnea or leg edema.  Denied cough,  wheezing or hemoptysis.  Denied nausea, vomiting or abdominal pain.  Denies any constipation or diarrhea.  Denies any frequency urgency or hesitancy.  Denies any arthralgias or myalgias.  Denies any skin rashes or lesions.  Denies any bleeding or clotting tendency.  Denies any easy bruising.  Denies any hair or nail changes.  Denies any anxiety or depression.  Remaining review of system is negative.                Medications: Without any changes on review. Current Outpatient Medications  Medication Sig Dispense Refill  . Cholecalciferol (VITAMIN D3) 1000 UNITS CAPS Take 1 capsule by mouth daily.    . [employer] ATHLETES FOOT 1 % cream Apply 1 application topically daily as needed.    . [employer] EAR DROPS 6.5 % OTIC solution 5 drops. Into aff ear 2x daily for 10 days    . HYDROcodone-acetaminophen (NORCO) 5-325 MG tablet Take 1 tablet by mouth every 6 (six) hours as needed for moderate pain. 60 tablet 0  . lidocaine-prilocaine (EMLA) cream Apply 1 application topically as needed. 30 g 0  . lisinopril (PRINIVIL,ZESTRIL) 10 MG tablet Take 1 tablet by mouth daily.    . Multiple Vitamin (MULTIVITAMIN) tablet Take 1 tablet by mouth daily.    . prochlorperazine (COMPAZINE) 10 MG tablet Take 1 tablet (10 mg total) by mouth every 6 (six) hours as needed for nausea or vomiting. 30 tablet 0  . vitamin B-12 (CYANOCOBALAMIN) 1000 MCG tablet Take 1,000 mcg by mouth daily.     No current facility-administered medications for this visit.  Allergies: No Known Allergies  Past Medical History, Surgical history, Social history, and Family History without any changes on review.   Physical Exam:     Blood pressure 132/66, pulse 93, temperature 97.8 F (36.6 C), temperature source Temporal, resp. rate 18, height 5\' 8"  (1.727 m), weight 155 lb 8 oz (70.5 kg), SpO2 97 %.     ECOG:    General appearance: Alert, awake without any distress. Head: Atraumatic without abnormalities Oropharynx:  Without any thrush or ulcers. Eyes: No scleral icterus. Lymph nodes: No lymphadenopathy noted in the cervical, supraclavicular, or axillary nodes Heart:regular rate and rhythm, without any murmurs or gallops.   Lung: Clear to auscultation without any rhonchi, wheezes or dullness to percussion. Abdomin: Soft, nontender without any shifting dullness or ascites. Musculoskeletal: No clubbing or cyanosis. Neurological: No motor or sensory deficits. Skin: No rashes or lesions.       Lab Results: Lab Results  Component Value Date   WBC PENDING 08/27/2019   HGB 5.6 (LL) 08/27/2019   HCT 17.4 (L) 08/27/2019   MCV 88.8 08/27/2019   PLT 42 (L) 08/27/2019     Chemistry      Component Value Date/Time   NA 138 08/27/2019 0924   NA 143 09/16/2017 0805   K 4.0 08/27/2019 0924   K 3.4 (L) 09/16/2017 0805   CL 106 08/27/2019 0924   CO2 23 08/27/2019 0924   CO2 29 09/16/2017 0805   BUN 15 08/27/2019 0924   BUN 9.1 09/16/2017 0805   CREATININE 0.72 08/27/2019 0924   CREATININE 0.8 09/16/2017 0805      Component Value Date/Time   CALCIUM 8.0 (L) 08/27/2019 0924   CALCIUM 8.4 09/16/2017 0805   ALKPHOS 388 (H) 08/27/2019 0924   ALKPHOS 53 09/16/2017 0805   AST 38 08/27/2019 0924   AST 26 09/16/2017 0805   ALT 14 08/27/2019 0924   ALT 19 09/16/2017 0805   BILITOT 0.3 08/27/2019 0924   BILITOT 0.64 09/16/2017 0805             Impression and Plan:  78 year old man with:  1.  Castration-resistant prostate cancer advanced prostate cancer with disease to the bone and a recent diagnosis of hepatic metastasis.  He was scheduled to start systemic chemotherapy in the near future although his counts today do reveal severe pancytopenia. His cytopenia could be related to previous Xofigo exposure and possibly bone marrow involvement with his prostate cancer.  Risks and benefits of starting chemotherapy in this particular setting was reviewed today.  Risk of myelosuppression is high  with Taxotere which could result in worsening anemia, neutropenia and more importantly thrombocytopenia and bleeding.  After discussion today, he opted to defer the start of chemotherapy for a short period of time to allow for possible count recovery.  If his counts do not recover or improve in the next 3 weeks, we might have to treat despite his counts at that time.   2. Androgen deprivation: He continues to receive that under the care of Dr. Diona Fanti at this time.   3. Bone directed therapy: I recommended continuing Xgeva at this time.  He is receiving it under the care of [facility].  4.  IV access: Port-A-Cath insertion was deferred because of his cytopenia.  This will be reconsidered in the future.  5.  Goals of care and prognosis: His disease is incurable and any therapy is palliative at this time.  His performance status is adequate and aggressive measures are warranted.  6.  Anemia: Related to his malignancy and potential marrow toxicity from previous radiation.  He will receive 2 units of packed red cell transfusion.  Risks and benefits of this approach was reviewed today.  Potential complication associated with blood transfusion include infusion related reaction, viral infection which is rare and rarely transfusion related pulmonary toxicities.  The benefit would be improvement in his oxygenation level and recovery.  He gave me a verbal consent today.  7.  Bone pain: Manageable at this time after recent radiation therapy.  8.  Thrombocytopenia: No bleeding noted at this time.  No need for transfusion for the time being.  I suspect his thrombocytopenia and his leukocytopenia is related to marrow toxicity related to radiation or marrow infiltration with prostate cancer.  9. Follow-up he will return next week for packed red cell transfusion and in 3 weeks for reevaluation.  30  minutes was spent with the patient face-to-face today.  More than 50% of time was spent on reviewing  his disease status, reviewing laboratory data, management options and complications related to therapy.    Zola Button, MD 10/16/202010:21 AM

## 2019-08-27 NOTE — Telephone Encounter (Signed)
Called and spoke to patient and made him aware of 8:00 lab appointment on 08/31/19 followed by an appointment for 2 units of blood. Patient verbalized understanding.

## 2019-08-27 NOTE — Telephone Encounter (Signed)
Per 10/16 schedule message changed treatment to 2 units blood 10/20 patient will not be given treatment. Per desk nurse added lab to blood 10/20 and cancelled 10/22 injection. Per desk nurse she will inform patient.

## 2019-08-27 NOTE — Telephone Encounter (Signed)
Dr. Alen Blew made aware of hemoglobin 5.6. Per Dr. Alen Blew patient will not receive chemo on 08/31/19 but will receive 2 units PRBC's instead. Scheduling message sent to change appointment from chemo infusion to blood transfusion and to add a lab appointment prior to blood transfusion.

## 2019-08-28 LAB — PROSTATE-SPECIFIC AG, SERUM (LABCORP): Prostate Specific Ag, Serum: 441 ng/mL — ABNORMAL HIGH (ref 0.0–4.0)

## 2019-08-31 ENCOUNTER — Inpatient Hospital Stay: Payer: Medicare Other

## 2019-08-31 ENCOUNTER — Other Ambulatory Visit: Payer: Self-pay

## 2019-08-31 DIAGNOSIS — D649 Anemia, unspecified: Secondary | ICD-10-CM

## 2019-08-31 DIAGNOSIS — C61 Malignant neoplasm of prostate: Secondary | ICD-10-CM

## 2019-08-31 LAB — PREPARE RBC (CROSSMATCH)

## 2019-08-31 LAB — CBC WITH DIFFERENTIAL (CANCER CENTER ONLY)
Abs Immature Granulocytes: 0.15 10*3/uL — ABNORMAL HIGH (ref 0.00–0.07)
Basophils Absolute: 0 10*3/uL (ref 0.0–0.1)
Basophils Relative: 1 %
Eosinophils Absolute: 0 10*3/uL (ref 0.0–0.5)
Eosinophils Relative: 0 %
HCT: 17.5 % — ABNORMAL LOW (ref 39.0–52.0)
Hemoglobin: 5.5 g/dL — CL (ref 13.0–17.0)
Immature Granulocytes: 6 %
Lymphocytes Relative: 32 %
Lymphs Abs: 0.8 10*3/uL (ref 0.7–4.0)
MCH: 28.8 pg (ref 26.0–34.0)
MCHC: 31.4 g/dL (ref 30.0–36.0)
MCV: 91.6 fL (ref 80.0–100.0)
Monocytes Absolute: 0.3 10*3/uL (ref 0.1–1.0)
Monocytes Relative: 14 %
Neutro Abs: 1.1 10*3/uL — ABNORMAL LOW (ref 1.7–7.7)
Neutrophils Relative %: 47 %
Platelet Count: 38 10*3/uL — ABNORMAL LOW (ref 150–400)
RBC: 1.91 MIL/uL — ABNORMAL LOW (ref 4.22–5.81)
RDW: 20.5 % — ABNORMAL HIGH (ref 11.5–15.5)
WBC Count: 3.4 10*3/uL — ABNORMAL LOW (ref 4.0–10.5)
nRBC: 20.5 % — ABNORMAL HIGH (ref 0.0–0.2)

## 2019-08-31 LAB — CMP (CANCER CENTER ONLY)
ALT: 16 U/L (ref 0–44)
AST: 45 U/L — ABNORMAL HIGH (ref 15–41)
Albumin: 3.4 g/dL — ABNORMAL LOW (ref 3.5–5.0)
Alkaline Phosphatase: 411 U/L — ABNORMAL HIGH (ref 38–126)
Anion gap: 12 (ref 5–15)
BUN: 15 mg/dL (ref 8–23)
CO2: 23 mmol/L (ref 22–32)
Calcium: 8.2 mg/dL — ABNORMAL LOW (ref 8.9–10.3)
Chloride: 105 mmol/L (ref 98–111)
Creatinine: 0.76 mg/dL (ref 0.61–1.24)
GFR, Est AFR Am: >60 mL/min (ref >60)
GFR, Estimated: >60 mL/min (ref >60)
Glucose, Bld: 107 mg/dL — ABNORMAL HIGH (ref 70–99)
Potassium: 4.6 mmol/L (ref 3.5–5.1)
Sodium: 140 mmol/L (ref 135–145)
Total Bilirubin: 0.4 mg/dL (ref 0.3–1.2)
Total Protein: 6 g/dL — ABNORMAL LOW (ref 6.5–8.1)

## 2019-08-31 LAB — SAMPLE TO BLOOD BANK

## 2019-08-31 LAB — ABO/RH: ABO/RH(D): B NEG

## 2019-08-31 MED ORDER — SODIUM CHLORIDE 0.9% FLUSH
3.0000 mL | INTRAVENOUS | Status: AC | PRN
  Filled 2019-08-31: qty 10

## 2019-08-31 MED ORDER — SODIUM CHLORIDE 0.9% IV SOLUTION
250.0000 mL | Freq: Once | INTRAVENOUS | Status: AC
  Administered 2019-08-31: 13:00:00 250 mL via INTRAVENOUS
  Filled 2019-08-31: qty 250

## 2019-08-31 MED ORDER — DIPHENHYDRAMINE HCL 25 MG PO CAPS
25.0000 mg | ORAL_CAPSULE | Freq: Once | ORAL | Status: AC
  Administered 2019-08-31: 25 mg via ORAL

## 2019-08-31 MED ORDER — DIPHENHYDRAMINE HCL 25 MG PO CAPS
ORAL_CAPSULE | ORAL | Status: AC
  Filled 2019-08-31: qty 1

## 2019-08-31 MED ORDER — ACETAMINOPHEN 325 MG PO TABS
650.0000 mg | ORAL_TABLET | Freq: Once | ORAL | Status: AC
  Administered 2019-08-31: 650 mg via ORAL

## 2019-08-31 MED ORDER — ACETAMINOPHEN 325 MG PO TABS
ORAL_TABLET | ORAL | Status: AC
  Filled 2019-08-31: qty 2

## 2019-08-31 MED ORDER — SODIUM CHLORIDE 0.9% IV SOLUTION
250.0000 mL | Freq: Once | INTRAVENOUS | Status: AC
  Administered 2019-08-31: 250 mL via INTRAVENOUS
  Filled 2019-08-31: qty 250

## 2019-08-31 NOTE — Patient Instructions (Signed)
Blood Transfusion, Adult, Care After This sheet gives you information about how to care for yourself after your procedure. Your doctor may also give you more specific instructions. If you have problems or questions, contact your doctor. Follow these instructions at home:   Take over-the-counter and prescription medicines only as told by your doctor.  Go back to your normal activities as told by your doctor.  Follow instructions from your doctor about how to take care of the area where an IV tube was put into your vein (insertion site). Make sure you: ? Wash your hands with soap and water before you change your bandage (dressing). If there is no soap and water, use hand sanitizer. ? Change your bandage as told by your doctor.  Check your IV insertion site every day for signs of infection. Check for: ? More redness, swelling, or pain. ? More fluid or blood. ? Warmth. ? Pus or a bad smell. Contact a doctor if:  You have more redness, swelling, or pain around the IV insertion site.  You have more fluid or blood coming from the IV insertion site.  Your IV insertion site feels warm to the touch.  You have pus or a bad smell coming from the IV insertion site.  Your pee (urine) turns pink, red, or brown.  You feel weak after doing your normal activities. Get help right away if:  You have signs of a serious allergic or body defense (immune) system reaction, including: ? Itchiness. ? Hives. ? Trouble breathing. ? Anxiety. ? Pain in your chest or lower back. ? Fever, flushing, and chills. ? Fast pulse. ? Rash. ? Watery poop (diarrhea). ? Throwing up (vomiting). ? Dark pee. ? Serious headache. ? Dizziness. ? Stiff neck. ? Yellow color in your face or the white parts of your eyes (jaundice). Summary  After a blood transfusion, return to your normal activities as told by your doctor.  Every day, check for signs of infection where the IV tube was put into your vein.  Some  signs of infection are warm skin, more redness and pain, more fluid or blood, and pus or a bad smell where the needle went in.  Contact your doctor if you feel weak or have any unusual symptoms. This information is not intended to replace advice given to you by your health care provider. Make sure you discuss any questions you have with your health care provider. Document Released: 11/18/2014 Document Revised: 03/04/2018 Document Reviewed: 06/21/2016 Elsevier Patient Education  2020 [employer]  

## 2019-08-31 NOTE — Progress Notes (Signed)
Patient's Hgb rechecked by lab and was 5.5. Paper copy of lab results given to Dr. Alen Blew. Per Dr. Alen Blew, patient to receive 2 units PRBCs and 1 unit platelets. Orders entered. Infusion room nurse and charge RN notified.

## 2019-09-01 LAB — BPAM RBC
Blood Product Expiration Date: 202011192359
Blood Product Expiration Date: 202011192359
ISSUE DATE / TIME: 202010201025
ISSUE DATE / TIME: 202010201025
Unit Type and Rh: 9500
Unit Type and Rh: 9500

## 2019-09-01 LAB — BPAM PLATELET PHERESIS
Blood Product Expiration Date: 202010222359
ISSUE DATE / TIME: 202010201045
Unit Type and Rh: 7300

## 2019-09-01 LAB — PROSTATE-SPECIFIC AG, SERUM (LABCORP): Prostate Specific Ag, Serum: 440 ng/mL — ABNORMAL HIGH (ref 0.0–4.0)

## 2019-09-01 LAB — PREPARE PLATELET PHERESIS: Unit division: 0

## 2019-09-01 LAB — TYPE AND SCREEN
ABO/RH(D): B NEG
Antibody Screen: NEGATIVE
Unit division: 0
Unit division: 0

## 2019-09-02 ENCOUNTER — Ambulatory Visit: Payer: Medicare Other

## 2019-09-02 ENCOUNTER — Other Ambulatory Visit: Payer: Self-pay | Admitting: Radiation Therapy

## 2019-09-16 ENCOUNTER — Other Ambulatory Visit: Payer: Self-pay | Admitting: Oncology

## 2019-09-16 MED ORDER — HYDROCODONE-ACETAMINOPHEN 5-325 MG PO TABS: 1.0000 | ORAL_TABLET | Freq: Four times a day (QID) | ORAL | 0 refills | Status: DC | PRN

## 2019-09-21 ENCOUNTER — Inpatient Hospital Stay: Payer: Medicare Other

## 2019-09-21 ENCOUNTER — Other Ambulatory Visit: Payer: Self-pay

## 2019-09-21 ENCOUNTER — Inpatient Hospital Stay: Payer: Medicare Other | Attending: Oncology | Admitting: Oncology

## 2019-09-21 VITALS — BP 141/79 | HR 90 | Temp 97.9°F | Resp 18 | Ht 68.0 in | Wt 150.3 lb

## 2019-09-21 DIAGNOSIS — M79605 Pain in left leg: Secondary | ICD-10-CM | POA: Diagnosis not present

## 2019-09-21 DIAGNOSIS — D649 Anemia, unspecified: Secondary | ICD-10-CM | POA: Diagnosis not present

## 2019-09-21 DIAGNOSIS — M898X9 Other specified disorders of bone, unspecified site: Secondary | ICD-10-CM | POA: Insufficient documentation

## 2019-09-21 DIAGNOSIS — C7951 Secondary malignant neoplasm of bone: Secondary | ICD-10-CM | POA: Diagnosis not present

## 2019-09-21 DIAGNOSIS — C787 Secondary malignant neoplasm of liver and intrahepatic bile duct: Secondary | ICD-10-CM | POA: Diagnosis not present

## 2019-09-21 DIAGNOSIS — G479 Sleep disorder, unspecified: Secondary | ICD-10-CM | POA: Insufficient documentation

## 2019-09-21 DIAGNOSIS — C61 Malignant neoplasm of prostate: Secondary | ICD-10-CM

## 2019-09-21 DIAGNOSIS — D696 Thrombocytopenia, unspecified: Secondary | ICD-10-CM | POA: Diagnosis not present

## 2019-09-21 DIAGNOSIS — M25562 Pain in left knee: Secondary | ICD-10-CM | POA: Diagnosis not present

## 2019-09-21 DIAGNOSIS — Z923 Personal history of irradiation: Secondary | ICD-10-CM | POA: Insufficient documentation

## 2019-09-21 DIAGNOSIS — Z79899 Other long term (current) drug therapy: Secondary | ICD-10-CM | POA: Diagnosis not present

## 2019-09-21 DIAGNOSIS — D630 Anemia in neoplastic disease: Secondary | ICD-10-CM | POA: Insufficient documentation

## 2019-09-21 LAB — CBC WITH DIFFERENTIAL (CANCER CENTER ONLY)
Abs Immature Granulocytes: 0.15 10*3/uL — ABNORMAL HIGH (ref 0.00–0.07)
Basophils Absolute: 0 10*3/uL (ref 0.0–0.1)
Basophils Relative: 1 %
Eosinophils Absolute: 0 10*3/uL (ref 0.0–0.5)
Eosinophils Relative: 0 %
HCT: 24.2 % — ABNORMAL LOW (ref 39.0–52.0)
Hemoglobin: 7.8 g/dL — ABNORMAL LOW (ref 13.0–17.0)
Immature Granulocytes: 4 %
Lymphocytes Relative: 32 %
Lymphs Abs: 1.2 10*3/uL (ref 0.7–4.0)
MCH: 29.5 pg (ref 26.0–34.0)
MCHC: 32.2 g/dL (ref 30.0–36.0)
MCV: 91.7 fL (ref 80.0–100.0)
Monocytes Absolute: 0.5 10*3/uL (ref 0.1–1.0)
Monocytes Relative: 14 %
Neutro Abs: 1.8 10*3/uL (ref 1.7–7.7)
Neutrophils Relative %: 49 %
Platelet Count: 65 10*3/uL — ABNORMAL LOW (ref 150–400)
RBC: 2.64 MIL/uL — ABNORMAL LOW (ref 4.22–5.81)
RDW: 17.2 % — ABNORMAL HIGH (ref 11.5–15.5)
WBC Count: 3.7 10*3/uL — ABNORMAL LOW (ref 4.0–10.5)
nRBC: 9 % — ABNORMAL HIGH (ref 0.0–0.2)

## 2019-09-21 LAB — CMP (CANCER CENTER ONLY)
ALT: 18 U/L (ref 0–44)
AST: 51 U/L — ABNORMAL HIGH (ref 15–41)
Albumin: 3.5 g/dL (ref 3.5–5.0)
Alkaline Phosphatase: 519 U/L — ABNORMAL HIGH (ref 38–126)
Anion gap: 11 (ref 5–15)
BUN: 16 mg/dL (ref 8–23)
CO2: 23 mmol/L (ref 22–32)
Calcium: 8.5 mg/dL — ABNORMAL LOW (ref 8.9–10.3)
Chloride: 102 mmol/L (ref 98–111)
Creatinine: 0.74 mg/dL (ref 0.61–1.24)
GFR, Est AFR Am: >60 mL/min (ref >60)
GFR, Estimated: >60 mL/min (ref >60)
Glucose, Bld: 96 mg/dL (ref 70–99)
Potassium: 4 mmol/L (ref 3.5–5.1)
Sodium: 136 mmol/L (ref 135–145)
Total Bilirubin: 0.4 mg/dL (ref 0.3–1.2)
Total Protein: 6.6 g/dL (ref 6.5–8.1)

## 2019-09-21 LAB — SAMPLE TO BLOOD BANK

## 2019-09-21 NOTE — Progress Notes (Signed)
Hematology and Oncology Follow Up Visit  Austin Oconnor [id number] 1941-08-17 78 y.o. 09/21/2019 11:16 AM [name], MDVaradarajan, [name],*   Principle Diagnosis: 78 year old man with advanced prostate cancer that is castration-resistant with the bone and hepatic metastasis since 2017.    Prior Therapy:  He is status post radiation therapy to the area under the care of Dr. Tammi Klippel between 02/19/2016 and 03/01/2016.   He was treated with Lupron under the care of Dr. Luberta Robertson every 4 months.   His PSA was up to 0.49 in November 2017 and in February 2018 was 2.07 his testosterone was adequately castrate at that time.  Indicating castration resistant disease.   [name] 1000 mg daily with prednisone 5 mg daily started in March 2018.  Therapy discontinued in February 2020 due to progression of disease.  He was started on Xtandi which was discontinued after poor tolerance in March 2020.   Trudi Ida on a monthly basis started in May 2020.  Therapy discontinued after April 13, 2019 because of progression of disease.  Status post radiation therapy to the proximal right femur and left sacrum completed in September 2020.   Current therapy: Under consideration to start systemic chemotherapy.  Interim History: Mr. Lawton presents today for a follow-up.  Since the last visit, he continues to have issues with leg pain and knee pain on the left side.  Despite the improvement he received from radiation therapy he still has issues and continues to take hydrocodone every 6 hours although feels that his pain is not lasting him.  He still able to ambulate and has reasonable quality of life but this quality of life is declining and have lost some more weight.  And difficulty sleeping as well in the last few nights.  His appetite is also declined as indicated by his weight.  Patient denied any alteration mental status, neuropathy, confusion or dizziness.  Denies any headaches or lethargy.  Denies  any night sweats, weight loss or changes in appetite.  Denied orthopnea, dyspnea on exertion or chest discomfort.  Denies shortness of breath, difficulty breathing hemoptysis or cough.  Denies any abdominal distention, nausea, early satiety or dyspepsia.  Denies any hematuria, frequency, dysuria or nocturia.  Denies any skin irritation, dryness or rash.  Denies any ecchymosis or petechiae.  Denies any lymphadenopathy or clotting.  Denies any heat or cold intolerance.  Denies any anxiety or depression.  Remaining review of system is negative.                     Medications: Unchanged on review. Current Outpatient Medications  Medication Sig Dispense Refill  . Cholecalciferol (VITAMIN D3) 1000 UNITS CAPS Take 1 capsule by mouth daily.    . [employer]S ATHLETES FOOT 1 % cream Apply 1 application topically daily as needed.    . [employer] EAR DROPS 6.5 % OTIC solution 5 drops. Into aff ear 2x daily for 10 days    . HYDROcodone-acetaminophen (NORCO) 5-325 MG tablet Take 1 tablet by mouth every 6 (six) hours as needed for moderate pain. 60 tablet 0  . lidocaine-prilocaine (EMLA) cream Apply 1 application topically as needed. 30 g 0  . lisinopril (PRINIVIL,ZESTRIL) 10 MG tablet Take 1 tablet by mouth daily.    . Multiple Vitamin (MULTIVITAMIN) tablet Take 1 tablet by mouth daily.    . prochlorperazine (COMPAZINE) 10 MG tablet Take 1 tablet (10 mg total) by mouth every 6 (six) hours as needed for nausea or vomiting. 30 tablet 0  .  vitamin B-12 (CYANOCOBALAMIN) 1000 MCG tablet Take 1,000 mcg by mouth daily.     No current facility-administered medications for this visit.    Facility-Administered Medications Ordered in Other Visits  Medication Dose Route Frequency Provider Last Rate Last Dose  . sodium chloride flush (NS) 0.9 % injection 3 mL  3 mL Intracatheter PRN Wyatt Portela, MD         Allergies: No Known Allergies  Past Medical History, Surgical history, Social history, and Family  History without any changes on review.   Physical Exam:     Blood pressure (!) 141/79, pulse 90, temperature 97.9 F (36.6 C), temperature source Temporal, resp. rate 18, height 5\' 8"  (1.727 m), weight 150 lb 4.8 oz (68.2 kg), SpO2 100 %.     ECOG: 1   General appearance: Comfortable appearing without any discomfort Head: Normocephalic without any trauma Oropharynx: Mucous membranes are moist and pink without any thrush or ulcers. Eyes: Pupils are equal and round reactive to light. Lymph nodes: No cervical, supraclavicular, inguinal or axillary lymphadenopathy.   Heart:regular rate and rhythm.  S1 and S2 without leg edema. Lung: Clear without any rhonchi or wheezes.  No dullness to percussion. Abdomin: Soft, nontender, nondistended with good bowel sounds.  No hepatosplenomegaly. Musculoskeletal: No joint deformity or effusion.  Full range of motion noted. Neurological: No deficits noted on motor, sensory and deep tendon reflex exam. Skin: No petechial rash or dryness.  Appeared moist.         Lab Results: Lab Results  Component Value Date   WBC 3.7 (L) 09/21/2019   HGB 7.8 (L) 09/21/2019   HCT 24.2 (L) 09/21/2019   MCV 91.7 09/21/2019   PLT 65 (L) 09/21/2019     Chemistry      Component Value Date/Time   NA 136 09/21/2019 1005   NA 143 09/16/2017 0805   K 4.0 09/21/2019 1005   K 3.4 (L) 09/16/2017 0805   CL 102 09/21/2019 1005   CO2 23 09/21/2019 1005   CO2 29 09/16/2017 0805   BUN 16 09/21/2019 1005   BUN 9.1 09/16/2017 0805   CREATININE 0.74 09/21/2019 1005   CREATININE 0.8 09/16/2017 0805      Component Value Date/Time   CALCIUM 8.5 (L) 09/21/2019 1005   CALCIUM 8.4 09/16/2017 0805   ALKPHOS 519 (H) 09/21/2019 1005   ALKPHOS 53 09/16/2017 0805   AST 51 (H) 09/21/2019 1005   AST 26 09/16/2017 0805   ALT 18 09/21/2019 1005   ALT 19 09/16/2017 0805   BILITOT 0.4 09/21/2019 1005   BILITOT 0.64 09/16/2017 0805             Impression and  Plan:  78 year old man with:  1.  Advanced prostate cancer with hepatic metastasis that is currently castration-resistant since 2017.  The natural course of this disease was reviewed today as well as treatment options.  He has progressed on therapies outlined above and has been under evaluation for Taxotere chemotherapy and has been deferred because of his cytopenias.  Laboratory data today showed improvement in his count after supportive transfusion and recovery time from his radiation.  Still he has substantial thrombocytopenia and would be at risk for developing complications related to chemotherapy including neutropenic sepsis, excessive fatigue and bleeding.  Other complication associated with chemotherapy was reviewed today including nausea, vomiting, neuropathy and further decline in his performance status.  Alternative approach would be supportive care and palliative approach rather than concentrating on anticancer therapy.  He  understands that regardless whether he received chemotherapy or not his prognosis is overall poor with limited life expectancy associated with hepatic metastasis.  After discussion today he opted against chemotherapy at this time and will continue with supportive care.  We will address symptom management approach and elicit help from palliative medicine services and home palliative care.  Transitioning to hospice likely would be in the immediate future.   2.  Goals of care and prognosis: Prognosis overall poor limited life expectancy.  His performance status is adequate but is declining fast at this time.  He would be a candidate for hospice in the near future we will continue to address that with him.  3.  Anemia: His hemoglobin is improving at this time does not require any additional transfusion.  4.  Bone pain: We have discussed different strategies at this time to improve his pain including using long-acting pain medication such as morphine, fentanyl patch and  scheduling hydrocodone.  For the time being we will schedule his hydrocodone and consider oxycodone potentially morphine long-acting.  5.  Thrombocytopenia: Platelet count is improving at this time and no active bleeding is noted.  This is related to previous treatment for his cancer as well as the potential malignant infiltration of his bone marrow.  6. Follow-up: In the next few weeks to follow his progress.  25  minutes was spent with the patient face-to-face today.  More than 50% of time was dedicated to updating his disease status, reviewing laboratory data, treatment options and future plan of care.    Zola Button, MD 11/10/202011:16 AM

## 2019-09-22 LAB — PROSTATE-SPECIFIC AG, SERUM (LABCORP): Prostate Specific Ag, Serum: 438 ng/mL — ABNORMAL HIGH (ref 0.0–4.0)

## 2019-09-23 ENCOUNTER — Ambulatory Visit: Payer: Medicare Other

## 2019-09-24 NOTE — Progress Notes (Signed)
  Radiation Oncology         ([phone]) [phone] ________________________________  Name: Austin Oconnor MRN: [id number]  Date: 08/25/2019  DOB: August 27, 1941  End of Treatment Note  Diagnosis:   78 y.o. 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     Indication for treatment:  Palliative       Radiation treatment dates:   08/12/2019 - 08/25/2019  Site/dose:    1. Right Hip / 30 Gy in 10 fractions 2. Left Sacrum / 30 Gy in 10 fractions  Beams/energy:    1. 3D, photons / 15X 2. Complex Isodose, photons / 6X, 15X  Narrative: The patient tolerated radiation treatment relatively well. Towards the beginning of treatment, he reported pain to his left hip, knee, and leg, numbness in his right leg, swelling in both feet, and some nausea. Towards the end of treatment, he reported mild fatigue and reported that the pain medicine was helping.  Plan: The patient has completed radiation treatment. The patient will return to radiation oncology clinic for routine followup in one month. I advised him to call or return sooner if he has any questions or concerns related to his recovery or treatment. ________________________________  Austin Oconnor, M.D.   This document serves as a record of services personally performed by Tyler Pita, MD. It was created on his behalf by Wilburn Mylar, a trained medical scribe. The creation of this record is based on the scribe's personal observations and the provider's statements to them. This document has been checked and approved by the attending provider.

## 2019-09-30 ENCOUNTER — Other Ambulatory Visit: Payer: Self-pay

## 2019-09-30 ENCOUNTER — Encounter: Payer: Self-pay | Admitting: Urology

## 2019-09-30 ENCOUNTER — Ambulatory Visit
Admission: RE | Admit: 2019-09-30 | Discharge: 2019-09-30 | Disposition: A | Discharge status: Home / Self Care | Payer: Medicare Other | Source: Ambulatory Visit | Attending: Urology | Admitting: Urology

## 2019-09-30 DIAGNOSIS — C7951 Secondary malignant neoplasm of bone: Secondary | ICD-10-CM

## 2019-09-30 DIAGNOSIS — C61 Malignant neoplasm of prostate: Secondary | ICD-10-CM

## 2019-09-30 NOTE — Progress Notes (Signed)
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    Interval Since Last Radiation:  5 weeks  08/12/2019 - 08/25/2019:  Palliative XRT 1. Right Hip / 30 Gy in 10 fractions 2. Left Sacrum / 30 Gy in 10 fractions  Narrative:  I spoke with the patient to conduct his routine scheduled 1 month follow up visit via telephone to spare the patient unnecessary potential exposure in the healthcare setting during the current COVID-19 pandemic.  The patient was notified in advance and gave permission to proceed with this visit format. He tolerated radiation treatment relatively well. Towards the beginning of treatment, he reported pain to his left hip, knee, and leg, numbness in his right leg, swelling in both feet, and some nausea. Towards the end of treatment, he reported mild fatigue and felt that the pain medicine was helping.                              On review of systems, the patient states that he is doing well overall but continues with significant pain that is no longer well controlled with his Vicodin.  He recently started taking the Vicodin every 4 hours instead of every 6 hours, at the direction of Dr. Alen Blew, but this is still not controlling the pain for a full 4 hours.  He reports that he has had some improvement in the hip and knee pain since completion of the radiation but still requiring narcotic pain medications and as per discussion with Dr. Alen Blew recently, if taking the Vicodin every 4 hours did not control his pain the plan was to change to a different pain medication altogether.  At the time of his most recent follow-up visit with Dr. Alen Blew, the decision was also  made to forego any further systemic therapy at this time and focus on more palliative/comfort care.  He denies any significant fatigue and reports a healthy appetite, maintaining his weight.  He suggests that if his pain were better controlled, allowing him to be a little more active, he would feel great!  ALLERGIES:  has No Known Allergies.  Meds: Current Outpatient Medications  Medication Sig Dispense Refill  . Cholecalciferol (VITAMIN D3) 1000 UNITS CAPS Take 1 capsule by mouth daily.    Marland Kitchen HYDROcodone-acetaminophen (NORCO) 5-325 MG tablet Take 1 tablet by mouth every 6 (six) hours as needed for moderate pain. 60 tablet 0  . lisinopril (PRINIVIL,ZESTRIL) 10 MG tablet Take 1 tablet by mouth daily.    . vitamin B-12 (CYANOCOBALAMIN) 1000 MCG tablet Take 1,000 mcg by mouth daily.    . [employer] ATHLETES FOOT 1 % cream Apply 1 application topically daily as needed.    . [employer] EAR DROPS 6.5 % OTIC solution 5 drops. Into aff ear 2x daily for 10 days    . lidocaine-prilocaine (EMLA) cream Apply 1 application topically as needed. (Patient not taking: Reported on 09/30/2019) 30 g 0  . Multiple Vitamin (MULTIVITAMIN) tablet Take 1 tablet by mouth daily.    . prochlorperazine (COMPAZINE) 10 MG tablet Take 1 tablet (10 mg total) by mouth every 6 (six) hours as needed for nausea or vomiting. (Patient not  taking: Reported on 09/30/2019) 30 tablet 0   No current facility-administered medications for this encounter.    Facility-Administered Medications Ordered in Other Encounters  Medication Dose Route Frequency Provider Last Rate Last Dose  . sodium chloride flush (NS) 0.9 % injection 3 mL  3 mL Intracatheter PRN Wyatt Portela, MD        Physical Findings:  vitals were not taken for this visit.   Karen Kays to assess due to telephone follow-up visit format.  Lab Findings: Lab Results  Component Value Date   WBC 3.7 (L) 09/21/2019   HGB 7.8 (L) 09/21/2019   HCT 24.2 (L) 09/21/2019   MCV 91.7 09/21/2019    PLT 65 (L) 09/21/2019     Radiographic Findings: No results found.  Impression/Plan: 1. 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   He appears to have recovered well from the effects of his recent radiotherapy.  We discussed that while we are happy to continue to participate in his care if clinically indicated, at this point, we will plan to see him back on as-needed basis.  He will continue in routine follow-up under the care and direction of Dr. Alen Blew for further management of his symptomatic systemic disease.  He knows to call at anytime with any questions or concerns related to his previous radiotherapy.    Nicholos Johns, PA-C

## 2019-10-01 ENCOUNTER — Other Ambulatory Visit: Payer: Self-pay | Admitting: Oncology

## 2019-10-01 MED ORDER — OXYCODONE HCL 5 MG PO TABS: 5.0000 mg | ORAL_TABLET | ORAL | 0 refills | Status: DC | PRN

## 2019-10-11 ENCOUNTER — Other Ambulatory Visit: Payer: Self-pay | Admitting: Oncology

## 2019-10-11 MED ORDER — OXYCODONE HCL 5 MG PO TABS: 5.0000 mg | ORAL_TABLET | ORAL | 0 refills | Status: DC | PRN

## 2019-10-14 ENCOUNTER — Inpatient Hospital Stay: Payer: Medicare Other

## 2019-10-14 ENCOUNTER — Other Ambulatory Visit: Payer: Self-pay

## 2019-10-14 ENCOUNTER — Telehealth: Payer: Self-pay

## 2019-10-14 ENCOUNTER — Inpatient Hospital Stay: Payer: Medicare Other | Attending: Oncology | Admitting: Oncology

## 2019-10-14 VITALS — BP 144/70 | HR 101 | Temp 98.5°F | Resp 18 | Ht 68.0 in | Wt 148.4 lb

## 2019-10-14 DIAGNOSIS — C7951 Secondary malignant neoplasm of bone: Secondary | ICD-10-CM | POA: Insufficient documentation

## 2019-10-14 DIAGNOSIS — D6481 Anemia due to antineoplastic chemotherapy: Secondary | ICD-10-CM | POA: Insufficient documentation

## 2019-10-14 DIAGNOSIS — D649 Anemia, unspecified: Secondary | ICD-10-CM

## 2019-10-14 DIAGNOSIS — I10 Essential (primary) hypertension: Secondary | ICD-10-CM | POA: Diagnosis not present

## 2019-10-14 DIAGNOSIS — G893 Neoplasm related pain (acute) (chronic): Secondary | ICD-10-CM | POA: Insufficient documentation

## 2019-10-14 DIAGNOSIS — S7292XA Unspecified fracture of left femur, initial encounter for closed fracture: Secondary | ICD-10-CM | POA: Insufficient documentation

## 2019-10-14 DIAGNOSIS — X58XXXA Exposure to other specified factors, initial encounter: Secondary | ICD-10-CM | POA: Diagnosis not present

## 2019-10-14 DIAGNOSIS — M899 Disorder of bone, unspecified: Secondary | ICD-10-CM | POA: Diagnosis not present

## 2019-10-14 DIAGNOSIS — Z800 Family history of malignant neoplasm of digestive organs: Secondary | ICD-10-CM | POA: Diagnosis not present

## 2019-10-14 DIAGNOSIS — C61 Malignant neoplasm of prostate: Secondary | ICD-10-CM | POA: Diagnosis not present

## 2019-10-14 DIAGNOSIS — Z8042 Family history of malignant neoplasm of prostate: Secondary | ICD-10-CM | POA: Insufficient documentation

## 2019-10-14 DIAGNOSIS — M25552 Pain in left hip: Secondary | ICD-10-CM | POA: Insufficient documentation

## 2019-10-14 LAB — CBC WITH DIFFERENTIAL (CANCER CENTER ONLY)
Abs Immature Granulocytes: 0.23 10*3/uL — ABNORMAL HIGH (ref 0.00–0.07)
Basophils Absolute: 0 10*3/uL (ref 0.0–0.1)
Basophils Relative: 1 %
Eosinophils Absolute: 0 10*3/uL (ref 0.0–0.5)
Eosinophils Relative: 1 %
HCT: 20.7 % — ABNORMAL LOW (ref 39.0–52.0)
Hemoglobin: 6.5 g/dL — CL (ref 13.0–17.0)
Immature Granulocytes: 5 %
Lymphocytes Relative: 29 %
Lymphs Abs: 1.2 10*3/uL (ref 0.7–4.0)
MCH: 29.5 pg (ref 26.0–34.0)
MCHC: 31.4 g/dL (ref 30.0–36.0)
MCV: 94.1 fL (ref 80.0–100.0)
Monocytes Absolute: 0.6 10*3/uL (ref 0.1–1.0)
Monocytes Relative: 14 %
Neutro Abs: 2.2 10*3/uL (ref 1.7–7.7)
Neutrophils Relative %: 50 %
Platelet Count: 45 10*3/uL — ABNORMAL LOW (ref 150–400)
RBC: 2.2 MIL/uL — ABNORMAL LOW (ref 4.22–5.81)
RDW: 18.6 % — ABNORMAL HIGH (ref 11.5–15.5)
WBC Count: 4.3 10*3/uL (ref 4.0–10.5)
nRBC: 14.8 % — ABNORMAL HIGH (ref 0.0–0.2)

## 2019-10-14 LAB — SAMPLE TO BLOOD BANK

## 2019-10-14 LAB — CMP (CANCER CENTER ONLY)
ALT: 25 U/L (ref 0–44)
AST: 49 U/L — ABNORMAL HIGH (ref 15–41)
Albumin: 3.5 g/dL (ref 3.5–5.0)
Alkaline Phosphatase: 581 U/L — ABNORMAL HIGH (ref 38–126)
Anion gap: 9 (ref 5–15)
BUN: 17 mg/dL (ref 8–23)
CO2: 26 mmol/L (ref 22–32)
Calcium: 8.7 mg/dL — ABNORMAL LOW (ref 8.9–10.3)
Chloride: 101 mmol/L (ref 98–111)
Creatinine: 0.83 mg/dL (ref 0.61–1.24)
GFR, Est AFR Am: >60 mL/min (ref >60)
GFR, Estimated: >60 mL/min (ref >60)
Glucose, Bld: 115 mg/dL — ABNORMAL HIGH (ref 70–99)
Potassium: 4.6 mmol/L (ref 3.5–5.1)
Sodium: 136 mmol/L (ref 135–145)
Total Bilirubin: 0.4 mg/dL (ref 0.3–1.2)
Total Protein: 6.5 g/dL (ref 6.5–8.1)

## 2019-10-14 LAB — PREPARE RBC (CROSSMATCH)

## 2019-10-14 MED ORDER — ACETAMINOPHEN 325 MG PO TABS
650.0000 mg | ORAL_TABLET | Freq: Once | ORAL | Status: AC
  Administered 2019-10-14: 650 mg via ORAL

## 2019-10-14 MED ORDER — DIPHENHYDRAMINE HCL 25 MG PO CAPS
25.0000 mg | ORAL_CAPSULE | Freq: Once | ORAL | Status: AC
  Administered 2019-10-14: 25 mg via ORAL

## 2019-10-14 MED ORDER — SODIUM CHLORIDE 0.9% IV SOLUTION
250.0000 mL | Freq: Once | INTRAVENOUS | Status: AC
  Administered 2019-10-14: 13:00:00 250 mL via INTRAVENOUS
  Filled 2019-10-14: qty 250

## 2019-10-14 MED ORDER — ACETAMINOPHEN 325 MG PO TABS
ORAL_TABLET | ORAL | Status: AC
  Filled 2019-10-14: qty 2

## 2019-10-14 MED ORDER — FENTANYL 25 MCG/HR TD PT72: 1.0000 | MEDICATED_PATCH | TRANSDERMAL | 0 refills | Status: DC

## 2019-10-14 MED ORDER — DIPHENHYDRAMINE HCL 25 MG PO CAPS
ORAL_CAPSULE | ORAL | Status: AC
  Filled 2019-10-14: qty 1

## 2019-10-14 NOTE — Patient Instructions (Signed)
Blood Transfusion, Adult, Care After This sheet gives you information about how to care for yourself after your procedure. Your doctor may also give you more specific instructions. If you have problems or questions, contact your doctor. Follow these instructions at home:   Take over-the-counter and prescription medicines only as told by your doctor.  Go back to your normal activities as told by your doctor.  Follow instructions from your doctor about how to take care of the area where an IV tube was put into your vein (insertion site). Make sure you: ? Wash your hands with soap and water before you change your bandage (dressing). If there is no soap and water, use hand sanitizer. ? Change your bandage as told by your doctor.  Check your IV insertion site every day for signs of infection. Check for: ? More redness, swelling, or pain. ? More fluid or blood. ? Warmth. ? Pus or a bad smell. Contact a doctor if:  You have more redness, swelling, or pain around the IV insertion site.  You have more fluid or blood coming from the IV insertion site.  Your IV insertion site feels warm to the touch.  You have pus or a bad smell coming from the IV insertion site.  Your pee (urine) turns pink, red, or brown.  You feel weak after doing your normal activities. Get help right away if:  You have signs of a serious allergic or body defense (immune) system reaction, including: ? Itchiness. ? Hives. ? Trouble breathing. ? Anxiety. ? Pain in your chest or lower back. ? Fever, flushing, and chills. ? Fast pulse. ? Rash. ? Watery poop (diarrhea). ? Throwing up (vomiting). ? Dark pee. ? Serious headache. ? Dizziness. ? Stiff neck. ? Yellow color in your face or the white parts of your eyes (jaundice). Summary  After a blood transfusion, return to your normal activities as told by your doctor.  Every day, check for signs of infection where the IV tube was put into your vein.  Some  signs of infection are warm skin, more redness and pain, more fluid or blood, and pus or a bad smell where the needle went in.  Contact your doctor if you feel weak or have any unusual symptoms. This information is not intended to replace advice given to you by your health care provider. Make sure you discuss any questions you have with your health care provider. Document Released: 11/18/2014 Document Revised: 03/04/2018 Document Reviewed: 06/21/2016 Elsevier Patient Education  2020 [employer]  

## 2019-10-14 NOTE — Telephone Encounter (Signed)
Received a call from Lelan Pons in the lab. Hemoglobin is 6.5. Dr. Alen Blew made aware and patient to receive 2 units of blood in the next 24-48 hours per Dr. Alen Blew.

## 2019-10-14 NOTE — Progress Notes (Signed)
Hematology and Oncology Follow Up Visit  Austin Oconnor [id number] [birth year] 78 y.o. 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    Prior Therapy:  He is status post radiation therapy to the area under the care of Dr. Tammi Klippel between 02/19/2016 and 03/01/2016.   He was treated with Lupron under the care of Dr. Luberta Robertson every 4 months.   His PSA was up to 0.49 in November 2017 and in February 2018 was 2.07 his testosterone was adequately castrate at that time.  Indicating castration resistant disease.   [name] 1000 mg daily with prednisone 5 mg daily started in March 2018.  Therapy discontinued in February 2020 due to progression of disease.  He was started on Xtandi which was discontinued after poor tolerance in March 2020.   Trudi Ida on a monthly basis started in May 2020.  Therapy discontinued after April 13, 2019 because of progression of disease.  Status post radiation therapy to the proximal right femur and left sacrum completed in September 2020.   Current therapy: Supportive management and under consideration for start of systemic therapy.  Interim History: Austin Oconnor returns today for a repeat follow-up.  Since the last visit, he reports continued slow decline in his overall health and performance status.  He is reporting more diffuse bone pain including hips shoulder and back.  He is using oxycodone 5 mg around-the-clock without any major relief.  He is not able to sleep at this time.  He has lost some weight and slight decline in his activity level.  He denied headaches, blurry vision, syncope or seizures.  Denies any fevers, chills or sweats.  Denied chest pain, palpitation, orthopnea or leg edema.  Denied cough, wheezing or hemoptysis.  Denied nausea, vomiting or abdominal pain.  Denies any constipation or diarrhea.   Denies any frequency urgency or hesitancy.  Denies any arthralgias or myalgias.  Denies any skin rashes or lesions.  Denies any bleeding or clotting tendency.  Denies any easy bruising.  Denies any hair or nail changes.  Denies any anxiety or depression.  Remaining review of system is negative.                      Medications: Unchanged on review. Current Outpatient Medications  Medication Sig Dispense Refill  . Cholecalciferol (VITAMIN D3) 1000 UNITS CAPS Take 1 capsule by mouth daily.    . [employer] ATHLETES FOOT 1 % cream Apply 1 application topically daily as needed.    . [employer] EAR DROPS 6.5 % OTIC solution 5 drops. Into aff ear 2x daily for 10 days    . lidocaine-prilocaine (EMLA) cream Apply 1 application topically as needed. (Patient not taking: Reported on 09/30/2019) 30 g 0  . lisinopril (PRINIVIL,ZESTRIL) 10 MG tablet Take 1 tablet by mouth daily.    . Multiple Vitamin (MULTIVITAMIN) tablet Take 1 tablet by mouth daily.    Marland Kitchen oxyCODONE (OXY IR/ROXICODONE) 5 MG immediate release tablet Take 1 tablet (5 mg total) by mouth every 4 (four) hours as needed for severe pain. 60 tablet 0  . prochlorperazine (COMPAZINE) 10 MG tablet Take 1 tablet (10 mg total) by mouth every 6 (six) hours as needed for nausea or vomiting. (Patient not taking: Reported on 09/30/2019) 30 tablet 0  . vitamin B-12 (CYANOCOBALAMIN) 1000 MCG tablet Take 1,000 mcg by mouth daily.     No current  facility-administered medications for this visit.    Facility-Administered Medications Ordered in Other Visits  Medication Dose Route Frequency Provider Last Rate Last Dose  . sodium chloride flush (NS) 0.9 % injection 3 mL  3 mL Intracatheter PRN Wyatt Portela, MD         Allergies: No Known Allergies  Past Medical History, Surgical history, Social history, and Family History updated without any changes.   Physical Exam:     Blood pressure (!) 144/70, pulse (!) 101, temperature 98.5 F (36.9 C),  temperature source Temporal, resp. rate 18, height 5\' 8"  (1.727 m), weight 148 lb 6.4 oz (67.3 kg), SpO2 100 %.      ECOG: 1     General appearance: Alert, awake without any distress. Head: Atraumatic without abnormalities Oropharynx: Without any thrush or ulcers. Eyes: No scleral icterus. Lymph nodes: No lymphadenopathy noted in the cervical, supraclavicular, or axillary nodes Heart:regular rate and rhythm, without any murmurs or gallops.   Lung: Clear to auscultation without any rhonchi, wheezes or dullness to percussion. Abdomin: Soft, nontender without any shifting dullness or ascites. Musculoskeletal: No clubbing or cyanosis. Neurological: No motor or sensory deficits. Skin: No rashes or lesions. Psychiatric: Mood and affect appeared normal.         Lab Results: Lab Results  Component Value Date   WBC 3.7 (L) 09/21/2019   HGB 7.8 (L) 09/21/2019   HCT 24.2 (L) 09/21/2019   MCV 91.7 09/21/2019   PLT 65 (L) 09/21/2019     Chemistry      Component Value Date/Time   NA 136 09/21/2019 1005   NA 143 09/16/2017 0805   K 4.0 09/21/2019 1005   K 3.4 (L) 09/16/2017 0805   CL 102 09/21/2019 1005   CO2 23 09/21/2019 1005   CO2 29 09/16/2017 0805   BUN 16 09/21/2019 1005   BUN 9.1 09/16/2017 0805   CREATININE 0.74 09/21/2019 1005   CREATININE 0.8 09/16/2017 0805      Component Value Date/Time   CALCIUM 8.5 (L) 09/21/2019 1005   CALCIUM 8.4 09/16/2017 0805   ALKPHOS 519 (H) 09/21/2019 1005   ALKPHOS 53 09/16/2017 0805   Oconnor 51 (H) 09/21/2019 1005   Oconnor 26 09/16/2017 0805   ALT 18 09/21/2019 1005   ALT 19 09/16/2017 0805   BILITOT 0.4 09/21/2019 1005   BILITOT 0.64 09/16/2017 0805             Impression and Plan:  78 year old man with:  1.  Castration-resistant prostate cancer with disease to the bone since 2017.  He is progression of disease and recent hepatic metastasis noted in 2020.    The natural course of this disease was reviewed today  and treatment options were reiterated.  Starting systemic chemotherapy was also reviewed again.  Complication associated with chemotherapy was also reviewed including myelosuppression, neuropathy as well as overall fatigue and tiredness.  The risk associated with pancytopenia including bleeding was reiterated.  After discussion today he opted against systemic chemotherapy and continue with supportive care only.   2.  Goals of care and prognosis: His disease is incurable and prognosis is poor.  Any treatment that would be palliative at this time.  He understands his overall life expectancy is limited and would likely require hospice in the near future.  3.  Anemia: Related to malignancy as well as cancer treatment.  4.  Bone pain: Long-acting pain medication is required at this time given his heavy oxycodone use.  Options of fentanyl  patch versus morphine was reviewed.  He opted to for fentanyl patch 25 mcg every 3 days and continues to use oxycodone for breakthrough.  5.  Thrombocytopenia: Related to malignancy and possibly bone marrow infiltration of prostate cancer.  6. Follow-up: Will be in the next 3 to 4 weeks for a repeat evaluation.  25  minutes was spent with the patient face-to-face today.  More than 50% of time was spent on updating his disease status, treatment options as well as coordinating future plan of care.    Zola Button, MD 12/3/202010:49 AM

## 2019-10-14 NOTE — Progress Notes (Signed)
Orders placed for 2 units PRBC. Patient scheduled to receive blood at 12:30 today. Patient aware and stated he will go get something to eat and return to [place] at 12:30. Spoke to [place] in blood bank to confirm orders received.

## 2019-10-15 LAB — TYPE AND SCREEN
ABO/RH(D): B NEG
Antibody Screen: NEGATIVE
Unit division: 0
Unit division: 0

## 2019-10-15 LAB — PROSTATE-SPECIFIC AG, SERUM (LABCORP): Prostate Specific Ag, Serum: 522 ng/mL — ABNORMAL HIGH (ref 0.0–4.0)

## 2019-10-15 LAB — BPAM RBC
Blood Product Expiration Date: 202101042359
Blood Product Expiration Date: 202101052359
ISSUE DATE / TIME: 202012031226
ISSUE DATE / TIME: 202012031226
Unit Type and Rh: 1700
Unit Type and Rh: 1700

## 2019-10-18 ENCOUNTER — Ambulatory Visit (HOSPITAL_COMMUNITY)
Admission: RE | Admit: 2019-10-18 | Discharge: 2019-10-18 | Disposition: A | Discharge status: Home / Self Care | Payer: Medicare Other | Source: Ambulatory Visit | Attending: Medical | Admitting: Medical

## 2019-10-18 ENCOUNTER — Other Ambulatory Visit: Payer: Self-pay | Admitting: Radiation Oncology

## 2019-10-18 ENCOUNTER — Telehealth: Payer: Self-pay

## 2019-10-18 ENCOUNTER — Inpatient Hospital Stay (HOSPITAL_BASED_OUTPATIENT_CLINIC_OR_DEPARTMENT_OTHER): Payer: Medicare Other | Admitting: Medical

## 2019-10-18 ENCOUNTER — Other Ambulatory Visit: Payer: Self-pay

## 2019-10-18 VITALS — BP 151/81 | HR 92 | Temp 98.5°F | Resp 20 | Ht 68.0 in | Wt 152.0 lb

## 2019-10-18 DIAGNOSIS — G893 Neoplasm related pain (acute) (chronic): Secondary | ICD-10-CM

## 2019-10-18 DIAGNOSIS — C61 Malignant neoplasm of prostate: Secondary | ICD-10-CM

## 2019-10-18 DIAGNOSIS — C7951 Secondary malignant neoplasm of bone: Secondary | ICD-10-CM

## 2019-10-18 DIAGNOSIS — S72002A Fracture of unspecified part of neck of left femur, initial encounter for closed fracture: Secondary | ICD-10-CM

## 2019-10-18 DIAGNOSIS — M84552A Pathological fracture in neoplastic disease, left femur, initial encounter for fracture: Secondary | ICD-10-CM | POA: Diagnosis not present

## 2019-10-18 DIAGNOSIS — M25552 Pain in left hip: Secondary | ICD-10-CM

## 2019-10-18 NOTE — Telephone Encounter (Signed)
Patient scheduled to see Sandi Mealy, PA in [facility] today (10/18/19) at 1pm. Scheduling message sent. Patient aware of time.

## 2019-10-18 NOTE — Telephone Encounter (Signed)
-----   Message from Wyatt Portela, MD sent at 10/18/2019 10:01 AM EST ----- Regarding: RE: Patient question Today or tomorrow please. Thanks ----- Message ----- From: Teodoro Spray, RN Sent: 10/18/2019   9:24 AM EST To: Wyatt Portela, MD Subject: Patient question                               Patient called office stating his L leg "went out" on Saturday.  Patient has been unable to ambulate due to intense pain when putting weight on it.  Patient states this has never happened before, but that he has had weakness in this leg before.  Patient denies discoloration to leg or numbness.   Would you like me to see if patient can be seen in the Symptom Management clinic today? Please advise.

## 2019-10-18 NOTE — Progress Notes (Signed)
Imaging results from today (10/18/2019) for leg pain discussed with pt and his son by PA Lucianne Lei.

## 2019-10-18 NOTE — Progress Notes (Signed)
[facility]pt[facility] Management [facility] Progress Note   DEZI BILL [id number] 04/15/1941 78 y.o.  Austin Oconnor is managed by Dr. Zola Button  Actively treated with chemotherapy/immunotherapy/hormonal therapy: no  Next scheduled appointment with provider: 11/10/2019  Assessment: Plan:    Left hip pain - Plan: DG Hip Unilat W or W/O Pelvis 1 View Left, DG FEMUR MIN 2 VIEWS LEFT  Bone metastasis ([place]) - Plan: DG Hip Unilat W or W/O Pelvis 1 View Left, DG FEMUR MIN 2 VIEWS LEFT  Malignant neoplasm of prostate ([place]) - Plan: DG Hip Unilat W or W/O Pelvis 1 View Left, DG FEMUR MIN 2 VIEWS LEFT  Closed fracture of neck of left femur, initial encounter ([place]) - Plan: Ambulatory referral to Orthopedic Surgery  Neoplasm related pain   Left hip pain with newly diagnosed closed fracture of the left femoral neck: The patient's x-rays were reviewed with Dr. Tyler Pita.  The patient's left hip and femur x-rays returned showing:  Nondisplaced fracture through the base of the left femoral neck. The fracture is nondisplaced and appears her incomplete. Sclerosis in the right inferior pubic ramus is consistent with metastatic disease from prostate cancer.  A referral has been placed to orthopedic surgery.  The office will be contacted with a request made that the patient be seen this week.   Neoplastic related pain: The patient was told to increase his spinal patch to 50 mcg every 3 days and to continue his OxyIR for breakthrough pain.   Metastatic prostate cancer: The patient is currently managed with surveillance.  He is scheduled to be seen by Dr. Alen Blew on 11/10/2019.   Please see After Visit Summary for patient specific instructions.  Future Appointments  Date Time Provider [place]  11/10/2019 12:45 PM [facility] 2 [facility] None  11/10/2019  1:15 PM Shadad, Mathis Dad, MD [facility] None    Orders Placed This Encounter  Procedures  . DG Hip Unilat W or W/O Pelvis 1 View  Left  . DG FEMUR MIN 2 VIEWS LEFT  . Ambulatory referral to Orthopedic Surgery       Subjective:   Patient ID:  Austin Oconnor is a 78 y.o. (DOB [birth year]) male.  Chief Complaint:  Chief Complaint  Patient presents with  . Leg Pain    Left    HPI Austin Oconnor  Is a 78 y.o. male with a diagnosis of a metastatic prostate cancer with multiple bony lytic lesions.  He is managed with surveillance only under the direction of Dr. Alen Blew.  He presents to the clinic today with with new onset left hip pain.  He doing fine until Saturday when he felt that his left leg had simply giving out and was no longer able to bear weight on it secondary to pain.  He has a history of radiation to his right femur and left sacrum which was completed September.  He denies any injuries or changes in activity.  He denies numbness, tingling, falls, change in leg length, urinary or bowel incontinence or hesitancy.  He has been walking with a walker at home for the past 2 days.  Medications: I have reviewed the patient's current medications.  Allergies: No Known Allergies  Past Medical History:  Diagnosis Date  . At risk for sleep apnea    STOP-BANG= 4      . Elevated PSA   . Hypertension   . Nocturia   . Prostate cancer ([place])   . Wears partial dentures  Past Surgical History:  Procedure Laterality Date  . INGUINAL HERNIA REPAIR Bilateral 1990  &  1960s  . LUMBAR SPINE SURGERY  age 89's  . PROSTATE BIOPSY N/A 09/16/2014   Procedure: BIOPSY TRANSRECTAL ULTRASONIC PROSTATE (TUBP);  Surgeon: Arvil Persons, MD;  Location: [facility];  Service: Urology;  Laterality: N/A;    Family History  Problem Relation Age of Onset  . Prostate cancer Brother   . Cancer Neg Hx   . Breast cancer Neg Hx   . Colon cancer Neg Hx   . Pancreatic cancer Neg Hx     Social History   Socioeconomic History  . Marital status: Widowed    Spouse name: Not on file  . Number of children: 3  . Years of  education: Not on file  . Highest education level: Not on file  Occupational History  . Not on file  Social Needs  . Financial resource strain: Not on file  . Food insecurity    Worry: Not on file    Inability: Not on file  . Transportation needs    Medical: Not on file    Non-medical: Not on file  Tobacco Use  . Smoking status: Never Smoker  . Smokeless tobacco: Never Used  Substance and Sexual Activity  . Alcohol use: No  . Drug use: No  . Sexual activity: Not Currently  Lifestyle  . Physical activity    Days per week: Not on file    Minutes per session: Not on file  . Stress: Not on file  Relationships  . Social Herbalist on phone: Not on file    Gets together: Not on file    Attends religious service: Not on file    Active member of club or organization: Not on file    Attends meetings of clubs or organizations: Not on file    Relationship status: Not on file  . Intimate partner violence    Fear of current or ex partner: Not on file    Emotionally abused: Not on file    Physically abused: Not on file    Forced sexual activity: Not on file  Other Topics Concern  . Not on file  Social History Narrative   3 children but one passed    Past Medical History, Surgical history, Social history, and Family history were reviewed and updated as appropriate.   Please see review of systems for further details on the patient's review from today.   Review of Systems:  Review of Systems  Constitutional: Negative for chills, diaphoresis and fever.  HENT: Negative for trouble swallowing and voice change.   Respiratory: Negative for cough, chest tightness, shortness of breath and wheezing.   Cardiovascular: Negative for chest pain and palpitations.  Gastrointestinal: Negative for abdominal pain, constipation, diarrhea, nausea and vomiting.  Genitourinary: Negative for decreased urine volume, difficulty urinating, dysuria, frequency and urgency.  Musculoskeletal:  Positive for arthralgias and gait problem. Negative for back pain and myalgias.  Neurological: Negative for dizziness, light-headedness and headaches.    Objective:   Physical Exam:  BP (!) 151/81 (BP Location: Right Arm, Patient Position: Sitting)   Pulse 92   Temp 98.5 F (36.9 C) (Oral)   Resp 20   Ht 5\' 8"  (1.727 m)   Wt 152 lb (68.9 kg)   SpO2 98%   BMI 23.11 kg/m  ECOG: 1  The patient's physical exam was completed from a wheelchair.  Physical Exam Constitutional:  General: He is not in acute distress.    Appearance: He is not diaphoretic.  HENT:     Head: Normocephalic and atraumatic.  Cardiovascular:     Rate and Rhythm: Normal rate and regular rhythm.     Heart sounds: Normal heart sounds. No murmur. No friction rub. No gallop.   Pulmonary:     Effort: Pulmonary effort is normal. No respiratory distress.     Breath sounds: Normal breath sounds. No wheezing or rales.  Musculoskeletal:     Left hip: He exhibits tenderness and bony tenderness. He exhibits normal strength, no swelling, no crepitus and no deformity.       Legs:  Skin:    General: Skin is warm and dry.     Findings: No erythema or rash.  Neurological:     Mental Status: He is alert.     Gait: Gait abnormal (The patient is ambulating with the use of a wheelchair.).     Comments: Decreased left hip abduction secondary to pain.  Lower extremity strength is otherwise intact bilaterally at 5/5.  Psychiatric:        Mood and Affect: Mood normal.        Behavior: Behavior normal.        Thought Content: Thought content normal.        Judgment: Judgment normal.     Lab Review:     Component Value Date/Time   NA 136 10/14/2019 1034   NA 143 09/16/2017 0805   K 4.6 10/14/2019 1034   K 3.4 (L) 09/16/2017 0805   CL 101 10/14/2019 1034   CO2 26 10/14/2019 1034   CO2 29 09/16/2017 0805   GLUCOSE 115 (H) 10/14/2019 1034   GLUCOSE 71 09/16/2017 0805   BUN 17 10/14/2019 1034   BUN 9.1 09/16/2017  0805   CREATININE 0.83 10/14/2019 1034   CREATININE 0.8 09/16/2017 0805   CALCIUM 8.7 (L) 10/14/2019 1034   CALCIUM 8.4 09/16/2017 0805   PROT 6.5 10/14/2019 1034   PROT 6.7 09/16/2017 0805   ALBUMIN 3.5 10/14/2019 1034   ALBUMIN 3.8 09/16/2017 0805   AST 49 (H) 10/14/2019 1034   AST 26 09/16/2017 0805   ALT 25 10/14/2019 1034   ALT 19 09/16/2017 0805   ALKPHOS 581 (H) 10/14/2019 1034   ALKPHOS 53 09/16/2017 0805   BILITOT 0.4 10/14/2019 1034   BILITOT 0.64 09/16/2017 0805   GFRNONAA >60 10/14/2019 1034   GFRAA >60 10/14/2019 1034       Component Value Date/Time   WBC 4.3 10/14/2019 1034   WBC 3.6 (L) 08/26/2019 1304   RBC 2.20 (L) 10/14/2019 1034   HGB 6.5 (LL) 10/14/2019 1034   HGB 13.2 09/16/2017 0805   HCT 20.7 (L) 10/14/2019 1034   HCT 39.1 09/16/2017 0805   PLT 45 (L) 10/14/2019 1034   PLT 189 09/16/2017 0805   MCV 94.1 10/14/2019 1034   MCV 91.4 09/16/2017 0805   MCH 29.5 10/14/2019 1034   MCHC 31.4 10/14/2019 1034   RDW 18.6 (H) 10/14/2019 1034   RDW 13.5 09/16/2017 0805   LYMPHSABS 1.2 10/14/2019 1034   LYMPHSABS 1.6 09/16/2017 0805   MONOABS 0.6 10/14/2019 1034   MONOABS 0.4 09/16/2017 0805   EOSABS 0.0 10/14/2019 1034   EOSABS 0.0 09/16/2017 0805   BASOSABS 0.0 10/14/2019 1034   BASOSABS 0.0 09/16/2017 0805   -------------------------------  Imaging from last 24 hours (if applicable):  Radiology interpretation: Dg Hip Unilat W Or W/o Pelvis 1 View  Left  Result Date: 10/18/2019 CLINICAL DATA:  Pt c/o acute hip pain, no inj, unable to bear weight on left leg, hx metastatic prostate ca.Acute left hip pain, unable to bear weight EXAM: DG HIP (WITH OR WITHOUT PELVIS) 1V*L* COMPARISON:  CT 12/15/2018 Degenerative osteophytosis of the spine. FINDINGS: Multiple sclerotic lesions throughout the LEFT and RIGHT pelvis involving the ischium and iliac bones. There is a subtle cortical disruption along the superior surface of the LEFT femoral neck. There is a linear  lucency that extends into the LEFT neck from this level. IMPRESSION: 1. Nondisplaced fracture of the LEFT femoral neck. 2. 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 These results will be called to the ordering clinician or representative by the Radiologist Assistant, and communication documented in the PACS or zVision Dashboard. Electronically Signed   By: Suzy Bouchard M.D.   On: 10/18/2019 14:16   Dg Femur Min 2 Views Left  Result Date: 10/18/2019 CLINICAL DATA:  Acute onset left hip pain and difficulty bearing weight. No known injury. History of metastatic prostate cancer. EXAM: LEFT FEMUR 2 VIEWS COMPARISON:  None. FINDINGS: The patient has an acute nondisplaced fracture through the base of the left femoral neck. The fracture is nondisplaced and appears incomplete. Sclerosis in the right inferior pubic ramus is consistent with metastatic disease from prostate cancer. IMPRESSION: Acute nondisplaced and incomplete appearing fracture base of the left femoral neck. These results will be called to the ordering clinician or representative by the Radiologist Assistant, and communication documented in the PACS or zVision Dashboard. Electronically Signed   By: Inge Rise M.D.   On: 10/18/2019 14:15

## 2019-10-18 NOTE — Patient Instructions (Signed)
COVID-19: How to Protect Yourself and Others Know how it spreads  There is currently no vaccine to prevent coronavirus disease 2019 (COVID-19).  The best way to prevent illness is to avoid being exposed to this virus.  The virus is thought to spread mainly from person-to-person. ? Between people who are in close contact with one another (within about 6 feet). ? Through respiratory droplets produced when an infected person coughs, sneezes or talks. ? These droplets can land in the mouths or noses of people who are nearby or possibly be inhaled into the lungs. ? Some recent studies have suggested that COVID-19 may be spread by people who are not showing symptoms. Everyone should Clean your hands often  Wash your hands often with soap and water for at least 20 seconds especially after you have been in a public place, or after blowing your nose, coughing, or sneezing.  If soap and water are not readily available, use a hand sanitizer that contains at least 60% alcohol. Cover all surfaces of your hands and rub them together until they feel dry.  Avoid touching your eyes, nose, and mouth with unwashed hands. Avoid close contact  Stay home if you are sick.  Avoid close contact with people who are sick.  Put distance between yourself and other people. ? Remember that some people without symptoms may be able to spread virus. ? This is especially important for people who are at higher risk of getting very sick.www.cdc.gov/coronavirus/2019-ncov/need-extra-precautions/people-at-higher-risk.html Cover your mouth and nose with a cloth face cover when around others  You could spread COVID-19 to others even if you do not feel sick.  Everyone should wear a cloth face cover when they have to go out in public, for example to the grocery store or to pick up other necessities. ? Cloth face coverings should not be placed on young children under age 2, anyone who has trouble breathing, or is unconscious,  incapacitated or otherwise unable to remove the mask without assistance.  The cloth face cover is meant to protect other people in case you are infected.  Do NOT use a facemask meant for a healthcare worker.  Continue to keep about 6 feet between yourself and others. The cloth face cover is not a substitute for social distancing. Cover coughs and sneezes  If you are in a private setting and do not have on your cloth face covering, remember to always cover your mouth and nose with a tissue when you cough or sneeze or use the inside of your elbow.  Throw used tissues in the trash.  Immediately wash your hands with soap and water for at least 20 seconds. If soap and water are not readily available, clean your hands with a hand sanitizer that contains at least 60% alcohol. Clean and disinfect  Clean AND disinfect frequently touched surfaces daily. This includes tables, doorknobs, light switches, countertops, handles, desks, phones, keyboards, toilets, faucets, and sinks. www.cdc.gov/coronavirus/2019-ncov/prevent-getting-sick/disinfecting-your-home.html  If surfaces are dirty, clean them: Use detergent or soap and water prior to disinfection.  Then, use a household disinfectant. You can see a list of EPA-registered household disinfectants here. cdc.gov/coronavirus 03/16/2019 This information is not intended to replace advice given to you by your health care provider. Make sure you discuss any questions you have with your health care provider. Document Released: 02/23/2019 Document Revised: 03/24/2019 Document Reviewed: 02/23/2019 Elsevier Patient Education  2020 Elsevier Inc.  

## 2019-10-20 ENCOUNTER — Ambulatory Visit (INDEPENDENT_AMBULATORY_CARE_PROVIDER_SITE_OTHER): Payer: Medicare Other | Admitting: Orthopaedic Surgery

## 2019-10-20 ENCOUNTER — Inpatient Hospital Stay (HOSPITAL_COMMUNITY)
Admission: EM | Admit: 2019-10-20 | Discharge: 2019-10-27 | DRG: 481 | Disposition: A | Discharge status: Home Health | Payer: Medicare Other | Source: Ambulatory Visit | Attending: Orthopaedic Surgery | Admitting: Orthopaedic Surgery

## 2019-10-20 ENCOUNTER — Encounter (HOSPITAL_COMMUNITY): Payer: Self-pay | Admitting: Emergency Medicine

## 2019-10-20 ENCOUNTER — Other Ambulatory Visit: Payer: Self-pay

## 2019-10-20 ENCOUNTER — Inpatient Hospital Stay: Admit: 2019-10-20 | Payer: Medicare Other | Admitting: Orthopaedic Surgery

## 2019-10-20 ENCOUNTER — Encounter: Payer: Self-pay | Admitting: Orthopaedic Surgery

## 2019-10-20 ENCOUNTER — Emergency Department (HOSPITAL_COMMUNITY): Payer: Medicare Other

## 2019-10-20 ENCOUNTER — Inpatient Hospital Stay: Admission: AD | Admit: 2019-10-20 | Payer: Medicare Other | Source: Ambulatory Visit | Admitting: Orthopaedic Surgery

## 2019-10-20 DIAGNOSIS — Z923 Personal history of irradiation: Secondary | ICD-10-CM

## 2019-10-20 DIAGNOSIS — Z20828 Contact with and (suspected) exposure to other viral communicable diseases: Secondary | ICD-10-CM | POA: Diagnosis present

## 2019-10-20 DIAGNOSIS — Z8042 Family history of malignant neoplasm of prostate: Secondary | ICD-10-CM | POA: Diagnosis not present

## 2019-10-20 DIAGNOSIS — D61818 Other pancytopenia: Secondary | ICD-10-CM | POA: Diagnosis present

## 2019-10-20 DIAGNOSIS — I119 Hypertensive heart disease without heart failure: Secondary | ICD-10-CM | POA: Diagnosis present

## 2019-10-20 DIAGNOSIS — Z419 Encounter for procedure for purposes other than remedying health state, unspecified: Secondary | ICD-10-CM

## 2019-10-20 DIAGNOSIS — M84452A Pathological fracture, left femur, initial encounter for fracture: Secondary | ICD-10-CM

## 2019-10-20 DIAGNOSIS — M84552A Pathological fracture in neoplastic disease, left femur, initial encounter for fracture: Principal | ICD-10-CM | POA: Diagnosis present

## 2019-10-20 DIAGNOSIS — C787 Secondary malignant neoplasm of liver and intrahepatic bile duct: Secondary | ICD-10-CM | POA: Diagnosis present

## 2019-10-20 DIAGNOSIS — Z79891 Long term (current) use of opiate analgesic: Secondary | ICD-10-CM | POA: Diagnosis not present

## 2019-10-20 DIAGNOSIS — I10 Essential (primary) hypertension: Secondary | ICD-10-CM | POA: Diagnosis present

## 2019-10-20 DIAGNOSIS — Z01810 Encounter for preprocedural cardiovascular examination: Secondary | ICD-10-CM

## 2019-10-20 DIAGNOSIS — C7951 Secondary malignant neoplasm of bone: Secondary | ICD-10-CM | POA: Diagnosis present

## 2019-10-20 DIAGNOSIS — D62 Acute posthemorrhagic anemia: Secondary | ICD-10-CM | POA: Diagnosis not present

## 2019-10-20 DIAGNOSIS — Z7983 Long term (current) use of bisphosphonates: Secondary | ICD-10-CM

## 2019-10-20 DIAGNOSIS — C61 Malignant neoplasm of prostate: Secondary | ICD-10-CM | POA: Diagnosis present

## 2019-10-20 DIAGNOSIS — I517 Cardiomegaly: Secondary | ICD-10-CM | POA: Diagnosis not present

## 2019-10-20 LAB — CBC
HCT: 29.8 % — ABNORMAL LOW (ref 39.0–52.0)
Hemoglobin: 9.4 g/dL — ABNORMAL LOW (ref 13.0–17.0)
MCH: 29.3 pg (ref 26.0–34.0)
MCHC: 31.5 g/dL (ref 30.0–36.0)
MCV: 92.8 fL (ref 80.0–100.0)
Platelets: 44 10*3/uL — ABNORMAL LOW (ref 150–400)
RBC: 3.21 MIL/uL — ABNORMAL LOW (ref 4.22–5.81)
RDW: 17.1 % — ABNORMAL HIGH (ref 11.5–15.5)
WBC: 3.8 10*3/uL — ABNORMAL LOW (ref 4.0–10.5)
nRBC: 12.1 % — ABNORMAL HIGH (ref 0.0–0.2)

## 2019-10-20 LAB — BASIC METABOLIC PANEL
Anion gap: 10 (ref 5–15)
BUN: 12 mg/dL (ref 8–23)
CO2: 24 mmol/L (ref 22–32)
Calcium: 8.8 mg/dL — ABNORMAL LOW (ref 8.9–10.3)
Chloride: 103 mmol/L (ref 98–111)
Creatinine, Ser: 0.77 mg/dL (ref 0.61–1.24)
GFR calc Af Amer: >60 mL/min (ref >60)
GFR calc non Af Amer: >60 mL/min (ref >60)
Glucose, Bld: 111 mg/dL — ABNORMAL HIGH (ref 70–99)
Potassium: 4.1 mmol/L (ref 3.5–5.1)
Sodium: 137 mmol/L (ref 135–145)

## 2019-10-20 MED ORDER — OXYCODONE HCL ER 10 MG PO T12A: 10.0000 mg | EXTENDED_RELEASE_TABLET | Freq: Two times a day (BID) | ORAL | Status: DC

## 2019-10-20 MED ORDER — OXYCODONE HCL 5 MG PO TABS
10.0000 mg | ORAL_TABLET | ORAL | Status: DC | PRN
  Filled 2019-10-20: qty 3

## 2019-10-20 MED ORDER — HYDROMORPHONE HCL 1 MG/ML IJ SOLN: 0.5000 mg | INTRAMUSCULAR | Status: DC | PRN

## 2019-10-20 MED ORDER — ACETAMINOPHEN 325 MG PO TABS: 325.0000 mg | ORAL_TABLET | Freq: Four times a day (QID) | ORAL | Status: DC | PRN

## 2019-10-20 MED ORDER — ONDANSETRON HCL 4 MG/2ML IJ SOLN: 4.0000 mg | Freq: Four times a day (QID) | INTRAMUSCULAR | Status: DC | PRN

## 2019-10-20 MED ORDER — ONDANSETRON HCL 4 MG PO TABS: 4.0000 mg | ORAL_TABLET | Freq: Four times a day (QID) | ORAL | Status: DC | PRN

## 2019-10-20 MED ORDER — OXYCODONE HCL 5 MG PO TABS
5.0000 mg | ORAL_TABLET | ORAL | Status: DC | PRN
  Administered 2019-10-20 – 2019-10-21 (×2): 10 mg via ORAL
  Filled 2019-10-20: qty 2

## 2019-10-20 MED ORDER — METHOCARBAMOL 500 MG PO TABS: 500.0000 mg | ORAL_TABLET | Freq: Four times a day (QID) | ORAL | Status: DC | PRN

## 2019-10-20 MED ORDER — ACETAMINOPHEN 500 MG PO TABS
1000.0000 mg | ORAL_TABLET | Freq: Four times a day (QID) | ORAL | Status: DC
  Administered 2019-10-21: 1000 mg via ORAL
  Filled 2019-10-20: qty 2

## 2019-10-20 MED ORDER — DOCUSATE SODIUM 100 MG PO CAPS
100.0000 mg | ORAL_CAPSULE | Freq: Two times a day (BID) | ORAL | Status: DC
  Administered 2019-10-20: 23:00:00 100 mg via ORAL
  Filled 2019-10-20: qty 1

## 2019-10-20 MED ORDER — METOCLOPRAMIDE HCL 5 MG/ML IJ SOLN: 5.0000 mg | Freq: Three times a day (TID) | INTRAMUSCULAR | Status: DC | PRN

## 2019-10-20 MED ORDER — METOCLOPRAMIDE HCL 10 MG PO TABS: 5.0000 mg | ORAL_TABLET | Freq: Three times a day (TID) | ORAL | Status: DC | PRN

## 2019-10-20 MED ORDER — DIPHENHYDRAMINE HCL 12.5 MG/5ML PO ELIX: 12.5000 mg | ORAL_SOLUTION | ORAL | Status: DC | PRN

## 2019-10-20 MED ORDER — METHOCARBAMOL 1000 MG/10ML IJ SOLN
500.0000 mg | Freq: Four times a day (QID) | INTRAVENOUS | Status: DC | PRN
  Filled 2019-10-20: qty 5

## 2019-10-20 NOTE — ED Provider Notes (Signed)
[facility] EMERGENCY DEPARTMENT Provider Note   CSN: [id number] Arrival date & time: 10/20/19  1739     History   Chief Complaint Chief Complaint  Patient presents with  . Hip Pain    HPI Austin Oconnor is a 78 y.o. male  Who was sent to the emergency department.  He is awaiting a bed for admission by Dr. Erlinda Hong.  Patient diagnosed with a pathologic left hip fracture which is unstable.  Patient reports he has had pain in the left hip for 3 years however last Saturday became severely painful he is unable to ambulate on it.  He has a history of bone metastasis from malignant neoplasm of the prostate.  Patient states he has been unable to ambulate on the hip.  He has no other complaints at this time.     HPI  Past Medical History:  Diagnosis Date  . At risk for sleep apnea    STOP-BANG= 4      . Elevated PSA   . Hypertension   . Nocturia   . Prostate cancer ([place])   . Wears partial dentures     Patient Active Problem List   Diagnosis Date Noted  . Goals of care, counseling/discussion 08/16/2019  . Malignant neoplasm of prostate ([place]) 02/05/2016  . Bone metastasis ([place]) 02/05/2016    Past Surgical History:  Procedure Laterality Date  . INGUINAL HERNIA REPAIR Bilateral 1990  &  1960s  . LUMBAR SPINE SURGERY  age 10's  . PROSTATE BIOPSY N/A 09/16/2014   Procedure: BIOPSY TRANSRECTAL ULTRASONIC PROSTATE (TUBP);  Surgeon: Arvil Persons, MD;  Location: [facility];  Service: Urology;  Laterality: N/A;        Home Medications    Prior to Admission medications   Medication Sig Start Date End Date Taking? Authorizing Provider  Cholecalciferol (VITAMIN D3) 1000 UNITS CAPS Take 1 capsule by mouth daily.    [provider]  [employer] ATHLETES FOOT 1 % cream Apply 1 application topically daily as needed. 10/14/18   [provider]  [employer] EAR DROPS 6.5 % OTIC solution 5 drops. Into aff ear 2x daily for 10 days 10/14/18   [provider]  fentaNYL (DURAGESIC) 25 MCG/HR Place 1 patch onto the skin every 3 (three) days. 10/14/19   Wyatt Portela, MD  lidocaine-prilocaine (EMLA) cream Apply 1 application topically as needed. Patient not taking: Reported on 09/30/2019 08/16/19   Wyatt Portela, MD  lisinopril (PRINIVIL,ZESTRIL) 10 MG tablet Take 1 tablet by mouth daily. 01/08/17   [provider]  Multiple Vitamin (MULTIVITAMIN) tablet Take 1 tablet by mouth daily.    [provider]  oxyCODONE (OXY IR/ROXICODONE) 5 MG immediate release tablet Take 1 tablet (5 mg total) by mouth every 4 (four) hours as needed for severe pain. 10/11/19   Wyatt Portela, MD  prochlorperazine (COMPAZINE) 10 MG tablet Take 1 tablet (10 mg total) by mouth every 6 (six) hours as needed for nausea or vomiting. Patient not taking: Reported on 09/30/2019 08/16/19   Wyatt Portela, MD  vitamin B-12 (CYANOCOBALAMIN) 1000 MCG tablet Take 1,000 mcg by mouth daily.    [provider]    Family History Family History  Problem Relation Age of Onset  . Prostate cancer Brother   . Cancer Neg Hx   . Breast cancer Neg Hx   . Colon cancer Neg Hx   . Pancreatic cancer Neg Hx     Social  History Social History   Tobacco Use  . Smoking status: Never Smoker  . Smokeless tobacco: Never Used  Substance Use Topics  . Alcohol use: No  . Drug use: No     Allergies   Patient has no known allergies.   Review of Systems Review of Systems Ten systems reviewed and are negative for acute change, except as noted in the HPI.   Physical Exam Updated Vital Signs BP (!) 151/84   Pulse 77   Temp 98.4 F (36.9 C) (Oral)   Resp 16   SpO2 95%   Physical Exam Vitals signs and nursing note reviewed.  Constitutional:      General: He is not in acute distress.    Appearance: He is well-developed and underweight. He is not diaphoretic.  HENT:     Head: Normocephalic and atraumatic.  Eyes:     General: No scleral icterus.     Conjunctiva/sclera: Conjunctivae normal.  Neck:     Musculoskeletal: Normal range of motion and neck supple.  Cardiovascular:     Rate and Rhythm: Normal rate and regular rhythm.     Heart sounds: Normal heart sounds.  Pulmonary:     Effort: Pulmonary effort is normal. No respiratory distress.     Breath sounds: Normal breath sounds.  Abdominal:     Palpations: Abdomen is soft.     Tenderness: There is no abdominal tenderness.  Musculoskeletal:     Comments: Pain with passive and active ROM of the left hip.  Skin:    General: Skin is warm and dry.  Neurological:     Mental Status: He is alert.  Psychiatric:        Behavior: Behavior normal.      ED Treatments / Results  Labs (all labs ordered are listed, but only abnormal results are displayed) Labs Reviewed - No data to display  EKG None  Radiology No results found.  Procedures Procedures (including critical care time)  Medications Ordered in ED Medications - No data to display   Initial Impression / Assessment and Plan / ED Course  I have reviewed the triage vital signs and the nursing notes.  Pertinent labs & imaging results that were available during my care of the patient were reviewed by me and considered in my medical decision making (see chart for details).        Patient will be admitted to the hospital under Dr. Erlinda Hong. Dr. Maudie Mercury of the hospital service will consult on the patient for medical clearance.  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

## 2019-10-20 NOTE — ED Triage Notes (Signed)
C/o L hip pain x 3 months.  Reports history of bone cancer.  Denies injury.

## 2019-10-20 NOTE — Consult Note (Signed)
TRH H&P    Patient Demographics:    Austin Oconnor, is a 79 y.o. male  MRN: [id number]  DOB - 1941-05-24  Admit Date - 10/20/2019  Referring MD/NP/PA:  Erlinda Hong  Outpatient Primary MD for the patient is [name], MD  Patient coming from: home  Chief complaint- pathologic fracture left hip   HPI:    Austin Oconnor  is a 78 y.o. male,  w hypertension metastatic prostate cancer s/p XRT, apparently presents with c/o left hip pain since Saturday when he was getting out of his car.  Pt presents to ED for evaluation. Pt was noted to have left nondisplaced femoral neck fracture.   In Ed,  T 98.4, P 77 R 16, Bp 154/80  Pox 95% on RA  Xray 10/18/2019 IMPRESSION: Acute nondisplaced and incomplete appearing fracture base of the left femoral neck.  Xray left pelvis IMPRESSION: 1. Nondisplaced fracture of the LEFT femoral neck. 2. 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  CXR IMPRESSION: Cardiomegaly without edema or large pleural effusion.  EKG nsr at 85, nl axis, nl int, poor R progression, no st-t changes c/w ischemia  Na 137, K 4.1,  Bun 12, Creatinine 0.77 Wbc 3.8, Hgb 9.4, Plt 44  Per ED, orthopedics will admit. Requests medical consult for medical issues  Pt will be admitted for left femoral neck fracture.      Review of systems:    In addition to the HPI above,  No Fever-chills, No Headache, No changes with Vision or hearing, No problems swallowing food or Liquids, No Chest pain, No cough,  No  Shortness of Breath at rest.  Slight dyspnea with walking since having had xrt No Abdominal pain, No Nausea or Vomiting, bowel movements are regular, No Blood in stool or Urine, No dysuria, No new skin rashes or bruises, No new joints pains-aches,  No new weakness, tingling, numbness in any extremity, No recent weight gain or loss, No polyuria,  polydypsia or polyphagia, No significant Mental Stressors.  All other systems reviewed and are negative.    Past History of the following :    Past Medical History:  Diagnosis Date  . At risk for sleep apnea    STOP-BANG= 4      . Elevated PSA   . Hypertension   . Nocturia   . Prostate cancer ([place])   . Wears partial dentures       Past Surgical History:  Procedure Laterality Date  . INGUINAL HERNIA REPAIR Bilateral 1990  &  1960s  . LUMBAR SPINE SURGERY  age 48's  . PROSTATE BIOPSY N/A 09/16/2014   Procedure: BIOPSY TRANSRECTAL ULTRASONIC PROSTATE (TUBP);  Surgeon: Arvil Persons, MD;  Location: [facility];  Service: Urology;  Laterality: N/A;      Social History:      Social History   Tobacco Use  . Smoking status: Never Smoker  . Smokeless tobacco: Never Used  Substance Use Topics  . Alcohol use: No       Family  History :     Family History  Problem Relation Age of Onset  . Prostate cancer Brother   . Cancer Neg Hx   . Breast cancer Neg Hx   . Colon cancer Neg Hx   . Pancreatic cancer Neg Hx        Home Medications:   Prior to Admission medications   Medication Sig Start Date End Date Taking? Authorizing Provider  alendronate (FOSAMAX) 70 MG tablet Take 70 mg by mouth once a week. 10/11/19  Yes [provider]  Cholecalciferol (VITAMIN D3) 1000 UNITS CAPS Take 1 capsule by mouth daily.   Yes [provider]  fentaNYL (DURAGESIC) 25 MCG/HR Place 1 patch onto the skin every 3 (three) days. 10/14/19  Yes Wyatt Portela, MD  lisinopril (PRINIVIL,ZESTRIL) 10 MG tablet Take 1 tablet by mouth every evening.  01/08/17  Yes [provider]  oxyCODONE (OXY IR/ROXICODONE) 5 MG immediate release tablet Take 1 tablet (5 mg total) by mouth every 4 (four) hours as needed for severe pain. 10/11/19  Yes Wyatt Portela, MD  vitamin B-12 (CYANOCOBALAMIN) 1000 MCG tablet Take 1,000 mcg by mouth daily.   Yes [provider]   lidocaine-prilocaine (EMLA) cream Apply 1 application topically as needed. Patient not taking: Reported on 09/30/2019 08/16/19   Wyatt Portela, MD  prochlorperazine (COMPAZINE) 10 MG tablet Take 1 tablet (10 mg total) by mouth every 6 (six) hours as needed for nausea or vomiting. Patient not taking: Reported on 09/30/2019 08/16/19   Wyatt Portela, MD     Allergies:    No Known Allergies   Physical Exam:   Vitals  Blood pressure (!) 164/91, pulse 78, temperature 98.2 F (36.8 C), temperature source Oral, resp. rate 18, SpO2 99 %.  1.  General: axoxo3  2. Psychiatric: euthymic  3. Neurologic: Nonfocal, cn2-12 intact, reflexes 2+ symmetric, diffuse with no clonus, motor 5/5 in all 4 ext  4. HEENMT:  Anicteric, pupils 1.31mm symmetric, direct, consensual intact Neck: no jvd  5. Respiratory : CTAB  6. Cardiovascular : rrr s1, s2, no m/g/r  7. Gastrointestinal:  ABd: soft, nt, nd, +bs  8. Skin:  Ext: no c/c/e, no rash  9.Musculoskeletal:  Good ROM    Data Review:    CBC Recent Labs  Lab 10/14/19 1034 10/20/19 2114  WBC 4.3 PENDING  HGB 6.5* 9.4*  HCT 20.7* 29.8*  PLT 45* PENDING  MCV 94.1 92.8  MCH 29.5 29.3  MCHC 31.4 31.5  RDW 18.6* 17.1*  LYMPHSABS 1.2  --   MONOABS 0.6  --   EOSABS 0.0  --   BASOSABS 0.0  --    ------------------------------------------------------------------------------------------------------------------  Results for orders placed or performed during the hospital encounter of 10/20/19 (from the past 48 hour(s))  CBC     Status: Abnormal (Preliminary result)   Collection Time: 10/20/19  9:14 PM  Result Value Ref Range   WBC PENDING 4.0 - 10.5 K/uL   RBC 3.21 (L) 4.22 - 5.81 MIL/uL   Hemoglobin 9.4 (L) 13.0 - 17.0 g/dL   HCT 29.8 (L) 39.0 - 52.0 %   MCV 92.8 80.0 - 100.0 fL   MCH 29.3 26.0 - 34.0 pg   MCHC 31.5 30.0 - 36.0 g/dL   RDW 17.1 (H) 11.5 - 15.5 %    Comment: Performed at [facility] [address], [place], [place] [address]   Platelets PENDING 150 - 400 K/uL   nRBC PENDING 0.0 -  0.2 %  Basic metabolic panel     Status: Abnormal   Collection Time: 10/20/19  9:14 PM  Result Value Ref Range   Sodium 137 135 - 145 mmol/L   Potassium 4.1 3.5 - 5.1 mmol/L   Chloride 103 98 - 111 mmol/L   CO2 24 22 - 32 mmol/L   Glucose, Bld 111 (H) 70 - 99 mg/dL   BUN 12 8 - 23 mg/dL   Creatinine, Ser 0.77 0.61 - 1.24 mg/dL   Calcium 8.8 (L) 8.9 - 10.3 mg/dL   GFR calc non Af Amer >60 >60 mL/min   GFR calc Af Amer >60 >60 mL/min   Anion gap 10 5 - 15    Comment: Performed at [facility], [place] [address] [place], [place] [address]    Chemistries  Recent Labs  Lab 10/14/19 1034 10/20/19 2114  NA 136 137  K 4.6 4.1  CL 101 103  CO2 26 24  GLUCOSE 115* 111*  BUN 17 12  CREATININE 0.83 0.77  CALCIUM 8.7* 8.8*  AST 49*  --   ALT 25  --   ALKPHOS 581*  --   BILITOT 0.4  --    ------------------------------------------------------------------------------------------------------------------  ------------------------------------------------------------------------------------------------------------------ GFR: Estimated Creatinine Clearance: 74.8 mL/min (by C-G formula based on SCr of 0.77 mg/dL). Liver Function Tests: Recent Labs  Lab 10/14/19 1034  AST 49*  ALT 25  ALKPHOS 581*  BILITOT 0.4  PROT 6.5  ALBUMIN 3.5   No results for input(s): LIPASE, AMYLASE in the last 168 hours. No results for input(s): AMMONIA in the last 168 hours. Coagulation Profile: No results for input(s): INR, PROTIME in the last 168 hours. Cardiac Enzymes: No results for input(s): CKTOTAL, CKMB, CKMBINDEX, TROPONINI in the last 168 hours. BNP (last 3 results) No results for input(s): PROBNP in the last 8760 hours. HbA1C: No results for input(s): HGBA1C in the last 72 hours. CBG: No results for input(s): GLUCAP in the last 168 hours. Lipid Profile: No results for input(s): CHOL, HDL, LDLCALC,  TRIG, CHOLHDL, LDLDIRECT in the last 72 hours. Thyroid Function Tests: No results for input(s): TSH, T4TOTAL, FREET4, T3FREE, THYROIDAB in the last 72 hours. Anemia Panel: No results for input(s): VITAMINB12, FOLATE, FERRITIN, TIBC, IRON, RETICCTPCT in the last 72 hours.  --------------------------------------------------------------------------------------------------------------- Urine analysis: No results found for: COLORURINE, APPEARANCEUR, LABSPEC, PHURINE, GLUCOSEU, HGBUR, BILIRUBINUR, KETONESUR, PROTEINUR, UROBILINOGEN, NITRITE, LEUKOCYTESUR    Imaging Results:    Dg Chest Port 1 View  Result Date: 10/20/2019 CLINICAL DATA:  Preop for left hip fracture. Pain. EXAM: PORTABLE CHEST 1 VIEW COMPARISON:  12/19/2018 FINDINGS: The heart size is moderately enlarged. There is no pneumothorax. No large pleural effusion. No large focal infiltrate. There are advanced degenerative changes of the right glenohumeral joint. IMPRESSION: Cardiomegaly without edema or large pleural effusion. Electronically Signed   By: Constance Holster M.D.   On: 10/20/2019 21:50       Assessment & Plan:    Principal Problem:   Pathologic intertrochanteric fracture, left, initial encounter [facility]) Active Problems:   Essential hypertension  Left femoral neck fracture NPO  Ns iv Type and screen covid-19 testing Pain control per orthopedics Orthopedics consulted by ED and admitted patient to their service, appreciate input.   Cardiomegaly on CXR Check cardiac echo If echo shows normal EF, then medically cleared to have surgery, if EF < 55% consider cardiology consult prior to surgery  Hypertension Cont Lisinopril 10mg  po qday Hydralazine 5mg  iv q6h prn  sbp >160  Anemia, Pancytopenia (stable) ? From XRT Check LDH Check cbc in am Please consult oncology in AM  Osteoporosis ? 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 Alen Blew as outpatient  DVT Prophylaxis-   Lovenox - SCDs   AM Labs Ordered, also please review Full Orders  Family Communication: Admission, patients condition and plan of care including tests being ordered have been discussed with the patient who indicate understanding and agree with the plan and Code Status.  Code Status:  FULL CODE per patient, left message for daughter that patient admitted to [facility] for left femoral fracture  Admission status:  /Inpatient: Based on patients clinical presentation and evaluation of above clinical data, I have made determination that patient meets Inpatient criteria at this time.   Time spent in minutes : 55 minutes    Jani Gravel M.D on 10/20/2019 at 10:07 PM

## 2019-10-20 NOTE — Progress Notes (Signed)
Office Visit Note   Patient: Austin Oconnor           Date of Birth: 1941-09-10           MRN: [id number] Visit Date: 10/20/2019              Requested by: Harle Stanford., PA-C [place],  [place] [address] PCP: [name], MD   Assessment & Plan: Visit Diagnoses:  1. Pathologic intertrochanteric fracture, left, initial encounter [facility])     Plan: Impression is impending left intertrochanteric pathologic fracture.  On the x-rays there is already a cortical defect on the superior aspect of the femoral neck.  He will need to have this stabilized urgently as the risk for fracture completion is extremely high.  I will plan on admitting him to the hospital tonight for surgery tomorrow.  We will consult medicine preoperatively for preop risk assessment.  We will place him on bed rest.  Details of the surgery and recovery and risk benefits alternatives were discussed in detail and based on discussion patient has elected to proceed with surgical stabilization. Total face to face encounter time was greater than 45 minutes and over half of this time was spent in counseling and/or coordination of care.  Follow-Up Instructions: Return for 2 week postop visit.   Orders:  No orders of the defined types were placed in this encounter.  No orders of the defined types were placed in this encounter.     Procedures: No procedures performed   Clinical Data: No additional findings.   Subjective: Chief Complaint  Patient presents with  . Left Hip - Pain    Austin Oconnor is a very pleasant 78 year old gentleman who has been referred here by his oncologist for an impending left intertrochanteric pathologic fracture due to metastatic prostate cancer.  He recently had a bone scan in September and an x-ray just recently.  He states that up into just a couple of days ago he was able to ambulate without pain but now he is essentially wheelchair-bound.  His daughter has been  providing transportation.   Review of Systems  Constitutional: Negative.   All other systems reviewed and are negative.    Objective: Vital Signs: There were no vitals taken for this visit.  Physical Exam Vitals signs and nursing note reviewed.  Constitutional:      Appearance: He is well-developed.  HENT:     Head: Normocephalic and atraumatic.  Eyes:     Pupils: Pupils are equal, round, and reactive to light.  Neck:     Musculoskeletal: Neck supple.  Pulmonary:     Effort: Pulmonary effort is normal.  Abdominal:     Palpations: Abdomen is soft.  Musculoskeletal: Normal range of motion.  Skin:    General: Skin is warm.  Neurological:     Mental Status: He is alert and oriented to person, place, and time.  Psychiatric:        Behavior: Behavior normal.        Thought Content: Thought content normal.        Judgment: Judgment normal.     Ortho Exam Left hip exam shows painful range of motion.  Weightbearing and ambulation not tested.  Neurovascular intact distally. Specialty Comments:  No specialty comments available.  Imaging: No results found.   PMFS History: Patient Active Problem List   Diagnosis Date Noted  . Goals of care, counseling/discussion 08/16/2019  . Malignant neoplasm of prostate ([place]) 02/05/2016  .  Bone metastasis ([place]) 02/05/2016   Past Medical History:  Diagnosis Date  . At risk for sleep apnea    STOP-BANG= 4      . Elevated PSA   . Hypertension   . Nocturia   . Prostate cancer ([place])   . Wears partial dentures     Family History  Problem Relation Age of Onset  . Prostate cancer Brother   . Cancer Neg Hx   . Breast cancer Neg Hx   . Colon cancer Neg Hx   . Pancreatic cancer Neg Hx     Past Surgical History:  Procedure Laterality Date  . INGUINAL HERNIA REPAIR Bilateral 1990  &  1960s  . LUMBAR SPINE SURGERY  age 34's  . PROSTATE BIOPSY N/A 09/16/2014   Procedure: BIOPSY TRANSRECTAL ULTRASONIC PROSTATE (TUBP);  Surgeon: Arvil Persons, MD;  [facility]: [facility];  Service: Urology;  Laterality: N/A;   Social History   Occupational History  . Not on file  Tobacco Use  . Smoking status: Never Smoker  . Smokeless tobacco: Never Used  Substance and Sexual Activity  . Alcohol use: No  . Drug use: No  . Sexual activity: Not Currently

## 2019-10-21 ENCOUNTER — Encounter (HOSPITAL_COMMUNITY)
Admission: EM | Disposition: A | Discharge status: Home Health | Payer: Self-pay | Source: Home / Self Care | Attending: Orthopaedic Surgery

## 2019-10-21 ENCOUNTER — Other Ambulatory Visit: Payer: Self-pay

## 2019-10-21 ENCOUNTER — Inpatient Hospital Stay (HOSPITAL_COMMUNITY): Payer: Medicare Other

## 2019-10-21 ENCOUNTER — Inpatient Hospital Stay (HOSPITAL_COMMUNITY): Payer: Medicare Other | Admitting: Certified Registered Nurse Anesthetist

## 2019-10-21 ENCOUNTER — Encounter (HOSPITAL_COMMUNITY): Payer: Self-pay | Admitting: Orthopaedic Surgery

## 2019-10-21 DIAGNOSIS — I517 Cardiomegaly: Secondary | ICD-10-CM

## 2019-10-21 DIAGNOSIS — D61818 Other pancytopenia: Secondary | ICD-10-CM | POA: Diagnosis present

## 2019-10-21 DIAGNOSIS — M84452A Pathological fracture, left femur, initial encounter for fracture: Secondary | ICD-10-CM

## 2019-10-21 HISTORY — PX: INTRAMEDULLARY (IM) NAIL INTERTROCHANTERIC: SHX5875

## 2019-10-21 LAB — COMPREHENSIVE METABOLIC PANEL
ALT: 30 U/L (ref 0–44)
AST: 60 U/L — ABNORMAL HIGH (ref 15–41)
Albumin: 2.9 g/dL — ABNORMAL LOW (ref 3.5–5.0)
Alkaline Phosphatase: 467 U/L — ABNORMAL HIGH (ref 38–126)
Anion gap: 9 (ref 5–15)
BUN: 10 mg/dL (ref 8–23)
CO2: 23 mmol/L (ref 22–32)
Calcium: 8.6 mg/dL — ABNORMAL LOW (ref 8.9–10.3)
Chloride: 105 mmol/L (ref 98–111)
Creatinine, Ser: 0.68 mg/dL (ref 0.61–1.24)
GFR calc Af Amer: >60 mL/min (ref >60)
GFR calc non Af Amer: >60 mL/min (ref >60)
Glucose, Bld: 93 mg/dL (ref 70–99)
Potassium: 3.9 mmol/L (ref 3.5–5.1)
Sodium: 137 mmol/L (ref 135–145)
Total Bilirubin: 1 mg/dL (ref 0.3–1.2)
Total Protein: 5.5 g/dL — ABNORMAL LOW (ref 6.5–8.1)

## 2019-10-21 LAB — URINALYSIS, ROUTINE W REFLEX MICROSCOPIC
Bacteria, UA: NONE SEEN
Bilirubin Urine: NEGATIVE
Glucose, UA: NEGATIVE mg/dL
Ketones, ur: NEGATIVE mg/dL
Leukocytes,Ua: NEGATIVE
Nitrite: NEGATIVE
Protein, ur: NEGATIVE mg/dL
Specific Gravity, Urine: 1.009 (ref 1.005–1.030)
pH: 5 (ref 5.0–8.0)

## 2019-10-21 LAB — PROTIME-INR
INR: 1 (ref 0.8–1.2)
Prothrombin Time: 13 seconds (ref 11.4–15.2)

## 2019-10-21 LAB — SARS CORONAVIRUS 2 (TAT 6-24 HRS): SARS Coronavirus 2: NEGATIVE

## 2019-10-21 LAB — CBC
HCT: 26.9 % — ABNORMAL LOW (ref 39.0–52.0)
Hemoglobin: 8.5 g/dL — ABNORMAL LOW (ref 13.0–17.0)
MCH: 29.6 pg (ref 26.0–34.0)
MCHC: 31.6 g/dL (ref 30.0–36.0)
MCV: 93.7 fL (ref 80.0–100.0)
Platelets: 38 10*3/uL — ABNORMAL LOW (ref 150–400)
RBC: 2.87 MIL/uL — ABNORMAL LOW (ref 4.22–5.81)
RDW: 17 % — ABNORMAL HIGH (ref 11.5–15.5)
WBC: 3.1 10*3/uL — ABNORMAL LOW (ref 4.0–10.5)
nRBC: 9.7 % — ABNORMAL HIGH (ref 0.0–0.2)

## 2019-10-21 LAB — ABO/RH: ABO/RH(D): B NEG

## 2019-10-21 LAB — ECHOCARDIOGRAM COMPLETE

## 2019-10-21 LAB — LACTATE DEHYDROGENASE: LDH: 1197 U/L — ABNORMAL HIGH (ref 98–192)

## 2019-10-21 SURGERY — FIXATION, FRACTURE, INTERTROCHANTERIC, WITH INTRAMEDULLARY ROD
Anesthesia: General | Site: Hip | Laterality: Left

## 2019-10-21 MED ORDER — MENTHOL 3 MG MT LOZG: 1.0000 | LOZENGE | OROMUCOSAL | Status: DC | PRN

## 2019-10-21 MED ORDER — LIDOCAINE 2% (20 MG/ML) 5 ML SYRINGE
INTRAMUSCULAR | Status: AC
  Filled 2019-10-21: qty 5

## 2019-10-21 MED ORDER — ENSURE PRE-SURGERY PO LIQD: 296.0000 mL | Freq: Once | ORAL | Status: DC

## 2019-10-21 MED ORDER — ACETAMINOPHEN 10 MG/ML IV SOLN
INTRAVENOUS | Status: AC
  Filled 2019-10-21: qty 100

## 2019-10-21 MED ORDER — ASPIRIN 325 MG PO TABS: 325.0000 mg | ORAL_TABLET | Freq: Every day | ORAL | 0 refills | Status: AC

## 2019-10-21 MED ORDER — HYDRALAZINE HCL 20 MG/ML IJ SOLN: 5.0000 mg | Freq: Four times a day (QID) | INTRAMUSCULAR | Status: DC | PRN

## 2019-10-21 MED ORDER — FENTANYL CITRATE (PF) 250 MCG/5ML IJ SOLN
INTRAMUSCULAR | Status: AC
  Filled 2019-10-21: qty 5

## 2019-10-21 MED ORDER — METHOCARBAMOL 1000 MG/10ML IJ SOLN
500.0000 mg | Freq: Four times a day (QID) | INTRAVENOUS | Status: DC | PRN
  Filled 2019-10-21: qty 5

## 2019-10-21 MED ORDER — HYDROMORPHONE HCL 1 MG/ML IJ SOLN
INTRAMUSCULAR | Status: AC
  Administered 2019-10-21: 0.5 mg via INTRAVENOUS
  Filled 2019-10-21: qty 1

## 2019-10-21 MED ORDER — ACETAMINOPHEN 10 MG/ML IV SOLN
INTRAVENOUS | Status: AC
  Administered 2019-10-21: 14:00:00 1000 mg via INTRAVENOUS
  Filled 2019-10-21: qty 100

## 2019-10-21 MED ORDER — POVIDONE-IODINE 10 % EX SWAB: 2.0000 "application " | Freq: Once | CUTANEOUS | Status: DC

## 2019-10-21 MED ORDER — SODIUM CHLORIDE 0.9 % IV SOLN: INTRAVENOUS | Status: DC

## 2019-10-21 MED ORDER — CALCIUM CARBONATE-VITAMIN D 500-200 MG-UNIT PO TABS: 1.0000 | ORAL_TABLET | Freq: Three times a day (TID) | ORAL | 6 refills | Status: DC

## 2019-10-21 MED ORDER — METHOCARBAMOL 750 MG PO TABS: 750.0000 mg | ORAL_TABLET | Freq: Two times a day (BID) | ORAL | 0 refills | Status: AC | PRN

## 2019-10-21 MED ORDER — HYDROMORPHONE HCL 1 MG/ML IJ SOLN
0.2500 mg | INTRAMUSCULAR | Status: DC | PRN
  Administered 2019-10-21: 15:00:00 0.5 mg via INTRAVENOUS

## 2019-10-21 MED ORDER — CEFAZOLIN SODIUM 1 G IJ SOLR
INTRAMUSCULAR | Status: AC
  Filled 2019-10-21: qty 20

## 2019-10-21 MED ORDER — SORBITOL 70 % SOLN
30.0000 mL | Freq: Every day | Status: DC | PRN
  Administered 2019-10-27: 30 mL via ORAL
  Filled 2019-10-21: qty 30

## 2019-10-21 MED ORDER — OXYCODONE HCL ER 10 MG PO T12A: 10.0000 mg | EXTENDED_RELEASE_TABLET | Freq: Two times a day (BID) | ORAL | 0 refills | Status: AC

## 2019-10-21 MED ORDER — PROPOFOL 10 MG/ML IV BOLUS
INTRAVENOUS | Status: DC | PRN
  Administered 2019-10-21: 50 mg via INTRAVENOUS
  Administered 2019-10-21: 100 mg via INTRAVENOUS
  Administered 2019-10-21: 50 mg via INTRAVENOUS

## 2019-10-21 MED ORDER — ONDANSETRON HCL 4 MG/2ML IJ SOLN
INTRAMUSCULAR | Status: AC
  Filled 2019-10-21: qty 2

## 2019-10-21 MED ORDER — DEXAMETHASONE SODIUM PHOSPHATE 10 MG/ML IJ SOLN
INTRAMUSCULAR | Status: AC
  Filled 2019-10-21: qty 1

## 2019-10-21 MED ORDER — LACTATED RINGERS IV SOLN
INTRAVENOUS | Status: DC | PRN
  Administered 2019-10-21: 11:00:00 via INTRAVENOUS

## 2019-10-21 MED ORDER — TRANEXAMIC ACID-NACL 1000-0.7 MG/100ML-% IV SOLN: 1000.0000 mg | INTRAVENOUS | Status: DC

## 2019-10-21 MED ORDER — LACTATED RINGERS IV SOLN
INTRAVENOUS | Status: DC
  Administered 2019-10-21: 11:00:00 via INTRAVENOUS

## 2019-10-21 MED ORDER — ONDANSETRON HCL 4 MG/2ML IJ SOLN
INTRAMUSCULAR | Status: DC | PRN
  Administered 2019-10-21: 4 mg via INTRAVENOUS

## 2019-10-21 MED ORDER — FENTANYL CITRATE (PF) 100 MCG/2ML IJ SOLN
25.0000 ug | INTRAMUSCULAR | Status: DC | PRN
  Administered 2019-10-21: 50 ug via INTRAVENOUS

## 2019-10-21 MED ORDER — OXYCODONE-ACETAMINOPHEN 5-325 MG PO TABS: 1.0000 | ORAL_TABLET | Freq: Three times a day (TID) | ORAL | 0 refills | Status: DC | PRN

## 2019-10-21 MED ORDER — ASPIRIN EC 325 MG PO TBEC
325.0000 mg | DELAYED_RELEASE_TABLET | Freq: Every day | ORAL | Status: DC
  Administered 2019-10-21 – 2019-10-27 (×7): 325 mg via ORAL
  Filled 2019-10-21 (×7): qty 1

## 2019-10-21 MED ORDER — METHOCARBAMOL 500 MG PO TABS
500.0000 mg | ORAL_TABLET | Freq: Four times a day (QID) | ORAL | Status: DC | PRN
  Administered 2019-10-21 – 2019-10-27 (×7): 500 mg via ORAL
  Filled 2019-10-21 (×8): qty 1

## 2019-10-21 MED ORDER — DEXAMETHASONE SODIUM PHOSPHATE 10 MG/ML IJ SOLN
INTRAMUSCULAR | Status: DC | PRN
  Administered 2019-10-21: 10 mg via INTRAVENOUS

## 2019-10-21 MED ORDER — MAGNESIUM CITRATE PO SOLN: 1.0000 | Freq: Once | ORAL | Status: DC | PRN

## 2019-10-21 MED ORDER — PHENOL 1.4 % MT LIQD: 1.0000 | OROMUCOSAL | Status: DC | PRN

## 2019-10-21 MED ORDER — OXYCODONE HCL 5 MG PO TABS
5.0000 mg | ORAL_TABLET | ORAL | Status: DC | PRN
  Administered 2019-10-21 – 2019-10-25 (×13): 10 mg via ORAL
  Administered 2019-10-25 (×2): 5 mg via ORAL
  Administered 2019-10-25 – 2019-10-26 (×2): 10 mg via ORAL
  Administered 2019-10-26 – 2019-10-27 (×7): 5 mg via ORAL
  Filled 2019-10-21: qty 2
  Filled 2019-10-21 (×3): qty 1
  Filled 2019-10-21 (×6): qty 2
  Filled 2019-10-21: qty 1
  Filled 2019-10-21 (×4): qty 2
  Filled 2019-10-21: qty 1
  Filled 2019-10-21 (×3): qty 2
  Filled 2019-10-21: qty 1
  Filled 2019-10-21: qty 2
  Filled 2019-10-21: qty 1
  Filled 2019-10-21: qty 2
  Filled 2019-10-21 (×2): qty 1

## 2019-10-21 MED ORDER — FENTANYL CITRATE (PF) 100 MCG/2ML IJ SOLN
INTRAMUSCULAR | Status: AC
  Administered 2019-10-21: 14:00:00 50 ug via INTRAVENOUS
  Filled 2019-10-21: qty 2

## 2019-10-21 MED ORDER — HEPARIN SODIUM (PORCINE) 5000 UNIT/ML IJ SOLN: 5000.0000 [IU] | Freq: Once | INTRAMUSCULAR | Status: DC

## 2019-10-21 MED ORDER — ONDANSETRON HCL 4 MG PO TABS: 4.0000 mg | ORAL_TABLET | Freq: Four times a day (QID) | ORAL | Status: DC | PRN

## 2019-10-21 MED ORDER — DOCUSATE SODIUM 100 MG PO CAPS
100.0000 mg | ORAL_CAPSULE | Freq: Two times a day (BID) | ORAL | Status: DC
  Administered 2019-10-21 – 2019-10-27 (×13): 100 mg via ORAL
  Filled 2019-10-21 (×14): qty 1

## 2019-10-21 MED ORDER — POLYETHYLENE GLYCOL 3350 17 G PO PACK
17.0000 g | PACK | Freq: Every day | ORAL | Status: DC | PRN
  Administered 2019-10-23: 09:00:00 17 g via ORAL
  Filled 2019-10-21: qty 1

## 2019-10-21 MED ORDER — ROCURONIUM BROMIDE 10 MG/ML (PF) SYRINGE
PREFILLED_SYRINGE | INTRAVENOUS | Status: DC | PRN
  Administered 2019-10-21: 20 mg via INTRAVENOUS
  Administered 2019-10-21: 10 mg via INTRAVENOUS
  Administered 2019-10-21: 30 mg via INTRAVENOUS

## 2019-10-21 MED ORDER — CEFAZOLIN SODIUM-DEXTROSE 2-4 GM/100ML-% IV SOLN
2.0000 g | INTRAVENOUS | Status: AC
  Administered 2019-10-21: 2 g via INTRAVENOUS

## 2019-10-21 MED ORDER — ZINC SULFATE 220 (50 ZN) MG PO CAPS: 220.0000 mg | ORAL_CAPSULE | Freq: Every day | ORAL | 0 refills | Status: DC

## 2019-10-21 MED ORDER — ALUM & MAG HYDROXIDE-SIMETH 200-200-20 MG/5ML PO SUSP: 30.0000 mL | ORAL | Status: DC | PRN

## 2019-10-21 MED ORDER — TRANEXAMIC ACID 1000 MG/10ML IV SOLN
2000.0000 mg | INTRAVENOUS | Status: DC
  Filled 2019-10-21: qty 20

## 2019-10-21 MED ORDER — CEFAZOLIN SODIUM-DEXTROSE 2-4 GM/100ML-% IV SOLN
2.0000 g | Freq: Four times a day (QID) | INTRAVENOUS | Status: AC
  Administered 2019-10-21 – 2019-10-22 (×3): 2 g via INTRAVENOUS
  Filled 2019-10-21 (×3): qty 100

## 2019-10-21 MED ORDER — PHENYLEPHRINE HCL-NACL 10-0.9 MG/250ML-% IV SOLN
INTRAVENOUS | Status: DC | PRN
  Administered 2019-10-21: 30 ug/min via INTRAVENOUS

## 2019-10-21 MED ORDER — ONDANSETRON HCL 4 MG/2ML IJ SOLN: 4.0000 mg | Freq: Four times a day (QID) | INTRAMUSCULAR | Status: DC | PRN

## 2019-10-21 MED ORDER — SUGAMMADEX SODIUM 200 MG/2ML IV SOLN
INTRAVENOUS | Status: DC | PRN
  Administered 2019-10-21: 150 mg via INTRAVENOUS

## 2019-10-21 MED ORDER — LIDOCAINE 2% (20 MG/ML) 5 ML SYRINGE
INTRAMUSCULAR | Status: DC | PRN
  Administered 2019-10-21: 60 mg via INTRAVENOUS

## 2019-10-21 MED ORDER — SODIUM CHLORIDE 0.9 % IV SOLN
INTRAVENOUS | Status: DC
  Administered 2019-10-21: 16:00:00 via INTRAVENOUS

## 2019-10-21 MED ORDER — LISINOPRIL 10 MG PO TABS
10.0000 mg | ORAL_TABLET | Freq: Every evening | ORAL | Status: DC
  Administered 2019-10-21 – 2019-10-27 (×5): 10 mg via ORAL
  Filled 2019-10-21 (×6): qty 1

## 2019-10-21 MED ORDER — PHENYLEPHRINE 40 MCG/ML (10ML) SYRINGE FOR IV PUSH (FOR BLOOD PRESSURE SUPPORT)
PREFILLED_SYRINGE | INTRAVENOUS | Status: DC | PRN
  Administered 2019-10-21: 80 ug via INTRAVENOUS
  Administered 2019-10-21: 120 ug via INTRAVENOUS
  Administered 2019-10-21 (×2): 80 ug via INTRAVENOUS
  Administered 2019-10-21: 120 ug via INTRAVENOUS

## 2019-10-21 MED ORDER — ACETAMINOPHEN 10 MG/ML IV SOLN
1000.0000 mg | Freq: Once | INTRAVENOUS | Status: DC | PRN
  Administered 2019-10-21: 14:00:00 1000 mg via INTRAVENOUS

## 2019-10-21 MED ORDER — SODIUM CHLORIDE 0.9 % IV SOLN
INTRAVENOUS | Status: AC
  Administered 2019-10-21: 03:00:00 1 mL via INTRAVENOUS

## 2019-10-21 MED ORDER — FENTANYL CITRATE (PF) 250 MCG/5ML IJ SOLN
INTRAMUSCULAR | Status: DC | PRN
  Administered 2019-10-21: 100 ug via INTRAVENOUS
  Administered 2019-10-21 (×3): 50 ug via INTRAVENOUS

## 2019-10-21 MED ORDER — POLYETHYLENE GLYCOL 3350 17 G PO PACK: 17.0000 g | PACK | Freq: Every day | ORAL | Status: DC | PRN

## 2019-10-21 MED ORDER — SORBITOL 70 % SOLN
30.0000 mL | Freq: Every day | Status: DC | PRN
  Filled 2019-10-21: qty 30

## 2019-10-21 MED ORDER — ROCURONIUM BROMIDE 10 MG/ML (PF) SYRINGE
PREFILLED_SYRINGE | INTRAVENOUS | Status: AC
  Filled 2019-10-21: qty 10

## 2019-10-21 MED ORDER — ONDANSETRON HCL 4 MG/2ML IJ SOLN: 4.0000 mg | Freq: Once | INTRAMUSCULAR | Status: DC | PRN

## 2019-10-21 SURGICAL SUPPLY — 48 items
BIT DRILL SHORT 4.0 (BIT) IMPLANT
BNDG COHESIVE 4X5 TAN STRL (GAUZE/BANDAGES/DRESSINGS) ×3 IMPLANT
BNDG COHESIVE 6X5 TAN STRL LF (GAUZE/BANDAGES/DRESSINGS) ×2 IMPLANT
BNDG GAUZE ELAST 4 BULKY (GAUZE/BANDAGES/DRESSINGS) ×3 IMPLANT
CONT SPEC 4OZ CLIKSEAL STRL BL (MISCELLANEOUS) ×2 IMPLANT
COVER PERINEAL POST (MISCELLANEOUS) ×3 IMPLANT
COVER SURGICAL LIGHT HANDLE (MISCELLANEOUS) ×3 IMPLANT
COVER WAND RF STERILE (DRAPES) ×3 IMPLANT
DRAPE C-ARMOR (DRAPES) ×3 IMPLANT
DRAPE STERI IOBAN 125X83 (DRAPES) ×3 IMPLANT
DRILL BIT SHORT 4.0 (BIT) ×3
DRSG MEPILEX BORDER 4X4 (GAUZE/BANDAGES/DRESSINGS) ×7 IMPLANT
DRSG MEPILEX BORDER 4X8 (GAUZE/BANDAGES/DRESSINGS) ×1 IMPLANT
DRSG PAD ABDOMINAL 8X10 ST (GAUZE/BANDAGES/DRESSINGS) ×4 IMPLANT
DURAPREP 26ML APPLICATOR (WOUND CARE) ×3 IMPLANT
ELECT REM PT RETURN 9FT ADLT (ELECTROSURGICAL) ×3
ELECTRODE REM PT RTRN 9FT ADLT (ELECTROSURGICAL) ×1 IMPLANT
GAUZE XEROFORM 5X9 LF (GAUZE/BANDAGES/DRESSINGS) ×2 IMPLANT
GLOVE BIOGEL PI IND STRL 7.0 (GLOVE) ×1 IMPLANT
GLOVE BIOGEL PI INDICATOR 7.0 (GLOVE) ×2
GLOVE ECLIPSE 7.0 STRL STRAW (GLOVE) ×3 IMPLANT
GLOVE SKINSENSE NS SZ7.5 (GLOVE) ×4
GLOVE SKINSENSE STRL SZ7.5 (GLOVE) ×2 IMPLANT
GOWN STRL REIN XL XLG (GOWN DISPOSABLE) ×3 IMPLANT
GUIDE PIN 3.2X343 (PIN) ×2
GUIDE PIN 3.2X343MM (PIN) ×6
GUIDE ROD 3.0 (MISCELLANEOUS) ×3
KIT BASIN OR (CUSTOM PROCEDURE TRAY) ×3 IMPLANT
KIT TURNOVER KIT B (KITS) ×3 IMPLANT
MANIFOLD NEPTUNE II (INSTRUMENTS) ×1 IMPLANT
NAIL TRIGEN LEFT 10X38-125 (Nail) ×2 IMPLANT
NS IRRIG 1000ML POUR BTL (IV SOLUTION) ×3 IMPLANT
PACK GENERAL/GYN (CUSTOM PROCEDURE TRAY) ×3 IMPLANT
PAD ARMBOARD 7.5X6 YLW CONV (MISCELLANEOUS) ×6 IMPLANT
PAD CAST 4YDX4 CTTN HI CHSV (CAST SUPPLIES) ×2 IMPLANT
PADDING CAST COTTON 4X4 STRL (CAST SUPPLIES) ×6
PIN GUIDE 3.2X343MM (PIN) IMPLANT
ROD GUIDE 3.0 (MISCELLANEOUS) IMPLANT
SCREW LAG COMPR KIT 100/95 (Screw) ×2 IMPLANT
SCREW TRIGEN LOW PROF 5.0X37.5 (Screw) ×2 IMPLANT
STAPLER VISISTAT 35W (STAPLE) ×1 IMPLANT
SUT VIC AB 0 CT1 27 (SUTURE) ×3
SUT VIC AB 0 CT1 27XBRD ANBCTR (SUTURE) ×1 IMPLANT
SUT VIC AB 2-0 CT1 27 (SUTURE) ×3
SUT VIC AB 2-0 CT1 TAPERPNT 27 (SUTURE) ×1 IMPLANT
TOWEL GREEN STERILE (TOWEL DISPOSABLE) ×3 IMPLANT
TOWEL GREEN STERILE FF (TOWEL DISPOSABLE) ×3 IMPLANT
WATER STERILE IRR 1000ML POUR (IV SOLUTION) ×3 IMPLANT

## 2019-10-21 NOTE — Op Note (Signed)
° °  Date of Surgery: 10/21/2019  INDICATIONS: Mr. Albu is a 78 y.o.-year-old male who has a pathologic left intertrochanteric fracture. The risks and benefits of the procedure discussed with the patient prior to the procedure and all questions were answered; consent was obtained.  PREOPERATIVE DIAGNOSIS: left pathologic intertrochanteric fracture   POSTOPERATIVE DIAGNOSIS: Same   PROCEDURE: Open treatment of intertrochanteric fracture with intramedullary implant. CPT [phone]   SURGEON: N. Eduard Roux, M.D.   ASSIST: Ciro Backer [place], [place]; necessary for the timely completion of procedure and due to complexity of procedure.  ANESTHESIA: general   IV FLUIDS AND URINE: See anesthesia record   ESTIMATED BLOOD LOSS: 250 cc  IMPLANTS: [employer] InterTAN 10 x 38, 100/95 lag screws  DRAINS: None.   COMPLICATIONS: see description of procedure.   DESCRIPTION OF PROCEDURE: The patient was brought to the operating room and placed supine on the operating table. The patient's leg had been signed prior to the procedure. The patient had the anesthesia placed by the anesthesiologist. The prep verification and incision time-outs were performed to confirm that this was the correct patient, site, side and location. The patient had an SCD on the opposite lower extremity. The patient did receive antibiotics prior to the incision and was re-dosed during the procedure as needed at indicated intervals. The patient was positioned on the fracture table with the leg in traction. The well leg was placed in a scissor position and all bony prominences were well-padded. The patient had the lower extremity prepped and draped in the standard surgical fashion. The incision was made 4 finger breadths superior to the greater trochanter. A guide pin was inserted into the tip of the greater trochanter under fluoroscopic guidance. An opening reamer was used to gain access to the femoral canal. The nail length was  measured and inserted down the femoral canal to its proper depth. The appropriate version of insertion for the lag screw was found under fluoroscopy. A pin was inserted up the femoral neck through the jig. Then, a second antirotation pin was inserted inferior to the first pin. The length of the lag screw was then measured. The lag screw was inserted as near to center-center in the head as possible. The antirotation pin was then taken out and an interdigitating compression screw was placed in its place. The leg was taken out of traction, then the interdigitating compression screw was used to compress across the fracture. Compression was visualized on serial xrays.  A distal interlocking screw was placed using the perfect circle technique.  The wound was copiously irrigated with saline and the subcutaneous layer closed with 2.0 vicryl and the skin was reapproximated with staples. The wounds were cleaned and dried a final time and a sterile dressing was placed. The hip was taken through a range of motion at the end of the case under fluoroscopic imaging to visualize the approach-withdraw phenomenon and confirm implant length in the head. The patient was then awakened from anesthesia and taken to the recovery room in stable condition. All counts were correct at the end of the case.   POSTOPERATIVE PLAN: The patient will be weight bearing as tolerated and will return in 2 weeks for staple removal and the patient will receive DVT prophylaxis based on other medications, activity level, and risk ratio of bleeding to thrombosis.   Azucena Cecil, MD [facility] 1:26 PM

## 2019-10-21 NOTE — Anesthesia Postprocedure Evaluation (Signed)
Anesthesia Post Note  Patient: Austin Oconnor  Procedure(s) Performed: LEFT INTERTROCHANTERIC INTRAMEDULLARY (IM) NAIL (Left Hip)     Patient location during evaluation: PACU Anesthesia Type: General Level of consciousness: awake and alert Pain management: pain level controlled Vital Signs Assessment: post-procedure vital signs reviewed and stable Respiratory status: spontaneous breathing, nonlabored ventilation, respiratory function stable and patient connected to nasal cannula oxygen Cardiovascular status: blood pressure returned to baseline and stable Postop Assessment: no apparent nausea or vomiting Anesthetic complications: no    Last Vitals:  Vitals:   10/21/19 1900 10/21/19 2018  BP:  120/63  Pulse:  (!) 113  Resp:    Temp: 36.6 C 36.6 C  SpO2:  100%    Last Pain:  Vitals:   10/21/19 2018  TempSrc: Oral  PainSc:                  Karyl Kinnier Ameria Sanjurjo

## 2019-10-21 NOTE — ED Notes (Signed)
Se paper charting for VS during down time

## 2019-10-21 NOTE — Progress Notes (Signed)
  Echocardiogram 2D Echocardiogram has been performed.  Darlina Sicilian M 10/21/2019, 8:04 AM

## 2019-10-21 NOTE — Progress Notes (Signed)
Pt back from PACU, HR slightly elevated and temperature a little low... vital signs updated, pt in pain..was treated for pain, and stable.   Vital Signs MEWS/VS Documentation      10/21/2019 1453 10/21/2019 1520 10/21/2019 1607 10/21/2019 1637   MEWS Score:  3  2  1  1    MEWS Score Color:  Yellow  Yellow  Green  Green   Resp:  20  17  --  --   Pulse:  (!) 107  (!) 108  (!) 110  --   BP:  90/72  127/81  --  --   Temp:  (!) 97 F (36.1 C)  (!) 97.3 F (36.3 C)  97.6 F (36.4 C)  --   O2 Device:  Room [employer]  --   Level of Consciousness:  Responds to [place] 10/21/2019,4:50 PM

## 2019-10-21 NOTE — Transfer of Care (Signed)
Immediate Anesthesia Transfer of Care Note  Patient: Austin Oconnor  Procedure(s) Performed: LEFT INTERTROCHANTERIC INTRAMEDULLARY (IM) NAIL (Left Hip)  Patient Location: PACU  Anesthesia Type:General  Level of Consciousness: awake, alert  and oriented  Airway & Oxygen Therapy: Patient Spontanous Breathing  Post-op Assessment: Report given to RN, Post -op Vital signs reviewed and stable and Patient moving all extremities X 4  Post vital signs: Reviewed and stable  Last Vitals:  Vitals Value Taken Time  BP    Temp    Pulse 100 10/21/19 1352  Resp 22 10/21/19 1352  SpO2 100 % 10/21/19 1352  Vitals shown include unvalidated device data.  Last Pain:  Vitals:   10/21/19 0342  TempSrc:   PainSc: 0-No pain         Complications: No apparent anesthesia complications

## 2019-10-21 NOTE — Plan of Care (Signed)

## 2019-10-21 NOTE — H&P (Signed)
See clinic note

## 2019-10-21 NOTE — Anesthesia Procedure Notes (Signed)
Procedure Name: Intubation Date/Time: 10/21/2019 12:24 PM Performed by: Harden Mo, CRNA Pre-anesthesia Checklist: Patient identified, Emergency Drugs available, Suction available and Patient being monitored Patient Re-evaluated:Patient Re-evaluated prior to induction Oxygen Delivery Method: Circle System Utilized Preoxygenation: Pre-oxygenation with 100% oxygen Induction Type: IV induction Ventilation: Mask ventilation without difficulty Laryngoscope Size: Miller and 2 Grade View: Grade III Tube type: Oral Tube size: 7.5 mm Number of attempts: 1 Airway Equipment and Method: Stylet Placement Confirmation: ETT inserted through vocal cords under direct vision,  positive ETCO2 and breath sounds checked- equal and bilateral Secured at: 22 cm Tube secured with: Tape Dental Injury: Teeth and Oropharynx as per pre-operative assessment

## 2019-10-21 NOTE — Discharge Instructions (Signed)
° ° °  1. Change dressings as needed °2. May shower but keep incisions covered and dry °3. Take lovenox to prevent blood clots °4. Take stool softeners as needed °5. Take pain meds as needed ° °

## 2019-10-21 NOTE — Anesthesia Preprocedure Evaluation (Addendum)
Anesthesia Evaluation  Patient identified by MRN, date of birth, ID band Patient awake    Reviewed: Allergy & Precautions, NPO status , Patient's Chart, lab work & pertinent test results  Airway Mallampati: III  TM Distance: >3 FB Neck ROM: Full    Dental  (+) Missing, Dental Advisory Given   Pulmonary neg pulmonary ROS,    Pulmonary exam normal breath sounds clear to auscultation       Cardiovascular hypertension, Pt. on medications Normal cardiovascular exam Rhythm:Regular Rate:Normal  ECG: SR, rate 83  ECHO: 1. Left ventricular ejection fraction, by visual estimation, is 60 to 65%. The left ventricle has normal function. There is mildly increased left ventricular hypertrophy. 2. Left ventricular diastolic parameters are consistent with Grade I diastolic dysfunction (impaired relaxation). 3. The left ventricle has no regional wall motion abnormalities. 4. Global right ventricle has normal systolic function.The right ventricular size is normal. 5. Left atrial size was normal. 6. Right atrial size was normal. 7. The mitral valve is normal in structure. Trivial mitral valve regurgitation. No evidence of mitral stenosis. 8. The tricuspid valve is normal in structure. Tricuspid valve regurgitation is mild. 9. The aortic valve is tricuspid. Aortic valve regurgitation is trivial. No evidence of aortic valve sclerosis or stenosis. 10. The pulmonic valve was normal in structure. Pulmonic valve regurgitation is trivial. 11. Mildly elevated pulmonary artery systolic pressure. 12. The inferior vena cava is normal in size with greater than 50% respiratory variability, suggesting right atrial pressure of 3 mmHg. 13. Normal LV systolic function; grade 1 diastolic dysfunction; mild LVH; trace AI and MR; mild TR; mildly elevated pulmonary pressure.   Neuro/Psych negative neurological ROS  negative psych ROS   GI/Hepatic negative GI ROS,  Neg liver ROS,   Endo/Other  negative endocrine ROS  Renal/GU negative Renal ROS     Musculoskeletal Metastatic prostate cancer   Abdominal   Peds  Hematology  (+) anemia , thrombocytopenia   Anesthesia Other Findings left femoral neck pathologic fracture  Reproductive/Obstetrics                          Anesthesia Physical Anesthesia Plan  ASA: III  Anesthesia Plan: General   Post-op Pain Management:    Induction: Intravenous  PONV Risk Score and Plan: 2 and Ondansetron, Dexamethasone, Midazolam and Treatment may vary due to age or medical condition  Airway Management Planned: Oral ETT  Additional Equipment:   Intra-op Plan:   Post-operative Plan: Extubation in OR  Informed Consent: I have reviewed the patients History and Physical, chart, labs and discussed the procedure including the risks, benefits and alternatives for the proposed anesthesia with the patient or authorized representative who has indicated his/her understanding and acceptance.     Dental advisory given  Plan Discussed with: CRNA  Anesthesia Plan Comments:        Anesthesia Quick Evaluation

## 2019-10-22 ENCOUNTER — Encounter: Payer: Self-pay | Admitting: *Deleted

## 2019-10-22 LAB — BASIC METABOLIC PANEL
Anion gap: 10 (ref 5–15)
BUN: 15 mg/dL (ref 8–23)
CO2: 23 mmol/L (ref 22–32)
Calcium: 8.4 mg/dL — ABNORMAL LOW (ref 8.9–10.3)
Chloride: 104 mmol/L (ref 98–111)
Creatinine, Ser: 0.9 mg/dL (ref 0.61–1.24)
GFR calc Af Amer: >60 mL/min (ref >60)
GFR calc non Af Amer: >60 mL/min (ref >60)
Glucose, Bld: 165 mg/dL — ABNORMAL HIGH (ref 70–99)
Potassium: 4.8 mmol/L (ref 3.5–5.1)
Sodium: 137 mmol/L (ref 135–145)

## 2019-10-22 LAB — CBC
HCT: 18.9 % — ABNORMAL LOW (ref 39.0–52.0)
Hemoglobin: 6.1 g/dL — CL (ref 13.0–17.0)
MCH: 30 pg (ref 26.0–34.0)
MCHC: 32.3 g/dL (ref 30.0–36.0)
MCV: 93.1 fL (ref 80.0–100.0)
Platelets: 38 10*3/uL — ABNORMAL LOW (ref 150–400)
RBC: 2.03 MIL/uL — ABNORMAL LOW (ref 4.22–5.81)
RDW: 17 % — ABNORMAL HIGH (ref 11.5–15.5)
WBC: 3.5 10*3/uL — ABNORMAL LOW (ref 4.0–10.5)
nRBC: 15.7 % — ABNORMAL HIGH (ref 0.0–0.2)

## 2019-10-22 LAB — PREPARE RBC (CROSSMATCH)

## 2019-10-22 LAB — HEMOGLOBIN AND HEMATOCRIT, BLOOD
HCT: 28.1 % — ABNORMAL LOW (ref 39.0–52.0)
Hemoglobin: 9.3 g/dL — ABNORMAL LOW (ref 13.0–17.0)

## 2019-10-22 LAB — PATHOLOGIST SMEAR REVIEW

## 2019-10-22 MED ORDER — FUROSEMIDE 10 MG/ML IJ SOLN
20.0000 mg | Freq: Once | INTRAMUSCULAR | Status: AC
  Administered 2019-10-22: 20 mg via INTRAVENOUS
  Filled 2019-10-22: qty 2

## 2019-10-22 MED ORDER — SODIUM CHLORIDE 0.9% IV SOLUTION
Freq: Once | INTRAVENOUS | Status: AC
  Administered 2019-10-22: 09:00:00 via INTRAVENOUS

## 2019-10-22 NOTE — Evaluation (Signed)
Physical Therapy Evaluation Patient Details Name: Austin Oconnor MRN: [id number] DOB: December 13, 1940 Today's Date: 10/22/2019   History of Present Illness  Pt is a 78 y/o male admitted secondary to pathological L intertrochanteric fracture s/p IM nail. PMH including but not limited to metastatic prostate cancer.     Clinical Impression  Pt presented supine in bed with HOB elevated, awake and willing to participate in therapy session. Prior to admission, pt was independent with ADLs and ambulated with use of a RW. Pt lives alone in a single level apartment. He will have his daughter available to assist him 24/7 upon d/c if needed. At the time of evaluation, pt overall moving considerably well. He performed bed mobility with min A, transfers with min A and ambulated within his room with RW and min guard for safety. Expect pt to make good progress to be able to d/c home with HHPT services. Pt would continue to benefit from skilled physical therapy services at this time while admitted and after d/c to address the below listed limitations in order to improve overall safety and independence with functional mobility.     Follow Up Recommendations Home health PT;Supervision for mobility/OOB    Equipment Recommendations  3in1 (PT)    Recommendations for Other Services       Precautions / Restrictions Precautions Precautions: Fall Restrictions Weight Bearing Restrictions: Yes LLE Weight Bearing: Weight bearing as tolerated      Mobility  Bed Mobility Overal bed mobility: Needs Assistance Bed Mobility: Supine to Sit     Supine to sit: Min assist     General bed mobility comments: increased time and effort, min A for movement of L LE off of bed  Transfers Overall transfer level: Needs assistance Equipment used: Rolling walker (2 wheeled) Transfers: Sit to/from Stand Sit to Stand: From elevated surface;Min assist;+2 safety/equipment         General transfer comment: cueing for safe  hand placement, bed in elevated position; min A for stability in standing with transition of hands from bed to RW  Ambulation/Gait Ambulation/Gait assistance: Min guard Gait Distance (Feet): 20 Feet Assistive device: Rolling walker (2 wheeled) Gait Pattern/deviations: Step-to pattern;Decreased step length - right;Decreased step length - left;Decreased stance time - left;Decreased stride length;Decreased weight shift to left Gait velocity: decreased   General Gait Details: pt with mild instability but no overt LOB or need for physical assistance; min guard for safety; pt with decreased weight through L LE and able to appropriately WB through UEs on RW  Stairs            Wheelchair Mobility    Modified Rankin (Stroke Patients Only)       Balance Overall balance assessment: Needs assistance Sitting-balance support: Feet supported Sitting balance-Leahy Scale: Good     Standing balance support: During functional activity;Bilateral upper extremity supported;Single extremity supported Standing balance-Leahy Scale: Poor                               Pertinent Vitals/Pain Pain Assessment: Faces Faces Pain Scale: Hurts little more Pain Location: L hip with transfers Pain Descriptors / Indicators: Sore Pain Intervention(s): Monitored during session;Repositioned    Home Living Family/patient expects to be discharged to:: Private residence Living Arrangements: Alone Available Help at Discharge: Family;Available 24 hours/day Type of Home: Apartment Home Access: Level entry     Home Layout: One level Home Equipment: Walker - 2 wheels;Cane - single point  Prior Function Level of Independence: Independent with assistive device(s)         Comments: ambulated with a walker     Hand Dominance        Extremity/Trunk Assessment   Upper Extremity Assessment Upper Extremity Assessment: Defer to OT evaluation;Overall WFL for tasks assessed    Lower  Extremity Assessment Lower Extremity Assessment: LLE deficits/detail LLE Deficits / Details: pt with decreased strength and ROM limitations secondary to post-op pain and weakness       Communication   Communication: No difficulties  Cognition Arousal/Alertness: Awake/alert Behavior During Therapy: WFL for tasks assessed/performed Overall Cognitive Status: Within Functional Limits for tasks assessed                                        General Comments      Exercises     Assessment/Plan    PT Assessment Patient needs continued PT services  PT Problem List Decreased strength;Decreased range of motion;Decreased activity tolerance;Decreased balance;Decreased mobility;Decreased coordination;Decreased safety awareness;Decreased knowledge of use of DME;Decreased knowledge of precautions;Pain       PT Treatment Interventions DME instruction;Gait training;Stair training;Functional mobility training;Therapeutic activities;Therapeutic exercise;Balance training;Neuromuscular re-education;Patient/family education    PT Goals (Current goals can be found in the Care Plan section)  Acute Rehab PT Goals Patient Stated Goal: decrease pain PT Goal Formulation: With patient Time For Goal Achievement: 11/05/19 Potential to Achieve Goals: Good    Frequency Min 3X/week   Barriers to discharge        Co-evaluation PT/OT/SLP Co-Evaluation/Treatment: Yes Reason for Co-Treatment: To address functional/ADL transfers;For patient/therapist safety PT goals addressed during session: Mobility/safety with mobility;Balance;Proper use of DME;Strengthening/ROM         AM-PAC PT "6 Clicks" Mobility  Outcome Measure Help needed turning from your back to your side while in a flat bed without using bedrails?: A Little Help needed moving from lying on your back to sitting on the side of a flat bed without using bedrails?: A Little Help needed moving to and from a bed to a chair  (including a wheelchair)?: A Little Help needed standing up from a chair using your arms (e.g., wheelchair or bedside chair)?: A Little Help needed to walk in hospital room?: A Little Help needed climbing 3-5 steps with a railing? : A Lot 6 Click Score: 17    End of Session Equipment Utilized During Treatment: Gait belt Activity Tolerance: Patient tolerated treatment well Patient left: in chair;with call bell/phone within reach;with chair alarm set Nurse Communication: Mobility status PT Visit Diagnosis: Other abnormalities of gait and mobility (R26.89);Pain Pain - Right/Left: Left Pain - part of body: Hip    Time: [id number] PT Time Calculation (min) (ACUTE ONLY): 30 min   Charges:   PT Evaluation $PT Eval Moderate Complexity: 1 Mod          Eduard Clos, PT, DPT  Acute Rehabilitation Services Pager [phone] Office [place] 10/22/2019, 12:34 PM

## 2019-10-22 NOTE — Progress Notes (Signed)
OT Cancellation Note  Patient Details Name: Austin Oconnor MRN: [id number] DOB: [birth year]   Cancelled Treatment:    Reason Eval/Treat Not Completed: Medical issues which prohibited therapy(pt with hgb of 6.1) Will follow.  Malka So 10/22/2019, 8:33 AM  Nestor Lewandowsky, OTR/L Acute Rehabilitation Services Pager: [phone] Office: [phone]

## 2019-10-22 NOTE — Plan of Care (Signed)
  Problem: Health Behavior/Discharge Planning: Goal: Ability to manage health-related needs will improve Outcome: Progressing   Problem: Activity: Goal: Risk for activity intolerance will decrease Outcome: Progressing   Problem: Pain Managment: Goal: General experience of comfort will improve Outcome: Progressing   Problem: Safety: Goal: Ability to remain free from injury will improve Outcome: Progressing   

## 2019-10-22 NOTE — Progress Notes (Signed)
Lab notified RN of critical HgB of 6.1. RN attempted to call PA Tawanna Cooler with value, but no answer. Day shift RN updated and will reach out to provider soon.

## 2019-10-22 NOTE — Progress Notes (Signed)
Subjective: 1 Day Post-Op Procedure(s) (LRB): LEFT INTERTROCHANTERIC INTRAMEDULLARY (IM) NAIL (Left) Patient reports pain as mild.    Objective: Vital signs in last 24 hours: Temp:  [97 F (36.1 C)-98 F (36.7 C)] 97.5 F (36.4 C) (12/11 0303) Pulse Rate:  [96-113] 96 (12/11 0303) Resp:  [13-20] 17 (12/10 1520) BP: (90-141)/(62-103) 112/62 (12/11 0303) SpO2:  [99 %-100 %] 99 % (12/11 0303) Weight:  [68.9 kg] 68.9 kg (12/10 1117)  Intake/Output from previous day: 12/10 0701 - 12/11 0700 In: 2258.5 [P.O.:150; I.V.:1808.5; IV Piggyback:300] Out: 700 [Urine:600; Blood:100] Intake/Output this shift: No intake/output data recorded.  Recent Labs    10/20/19 2114 10/21/19 0700 10/22/19 0538  HGB 9.4* 8.5* 6.1*   Recent Labs    10/21/19 0700 10/22/19 0538  WBC 3.1* 3.5*  RBC 2.87* 2.03*  HCT 26.9* 18.9*  PLT 38* 38*   Recent Labs    10/21/19 0700 10/22/19 0538  NA 137 137  K 3.9 4.8  CL 105 104  CO2 23 23  BUN 10 15  CREATININE 0.68 0.90  GLUCOSE 93 165*  CALCIUM 8.6* 8.4*   Recent Labs    10/21/19 0700  INR 1.0    Neurologically intact Neurovascular intact Sensation intact distally Intact pulses distally Dorsiflexion/Plantar flexion intact Incision: moderate drainage No cellulitis present Compartment soft   Assessment/Plan: 1 Day Post-Op Procedure(s) (LRB): LEFT INTERTROCHANTERIC INTRAMEDULLARY (IM) NAIL (Left) Advance diet Up with therapy WBAT LLE Bandage changed by me today ABLA- will transfuse with 2 units prbc (ordered) DVT ppx- ASA 325 daily x 6 weeks    [name] 10/22/2019, 7:41 AM

## 2019-10-22 NOTE — Evaluation (Signed)
Occupational Therapy Evaluation Patient Details Name: Austin Oconnor MRN: [id number] DOB: Apr 19, 1941 Today's Date: 10/22/2019    History of Present Illness Pt is a 78 y/o male admitted secondary to pathological L intertrochanteric fracture s/p IM nail. PMH including but not limited to metastatic prostate cancer.    Clinical Impression   Pt was independent until a week ago when his pain prevented him from walking and he needed assistance for LB dressing. Pt presents with impaired standing balance and LB weakness. He requires up to min assist for ADL. Will follow acutely. Recommending HHOT to address tub equipment and IADL as pt lives alone. His daughter will stay with him initially.    Follow Up Recommendations  Home health OT    Equipment Recommendations  3 in 1 bedside commode    Recommendations for Other Services       Precautions / Restrictions Precautions Precautions: Fall Restrictions Weight Bearing Restrictions: Yes LLE Weight Bearing: Weight bearing as tolerated      Mobility Bed Mobility Overal bed mobility: Needs Assistance Bed Mobility: Supine to Sit     Supine to sit: Min assist     General bed mobility comments: increased time and effort, min A for movement of L LE off of bed  Transfers Overall transfer level: Needs assistance Equipment used: Rolling walker (2 wheeled) Transfers: Sit to/from Stand Sit to Stand: From elevated surface;Min assist;+2 safety/equipment         General transfer comment: cueing for safe hand placement, bed in elevated position; min A for stability in standing with transition of hands from bed to RW    Balance Overall balance assessment: Needs assistance Sitting-balance support: Feet supported Sitting balance-Leahy Scale: Good     Standing balance support: During functional activity;Bilateral upper extremity supported;Single extremity supported Standing balance-Leahy Scale: Poor                              ADL either performed or assessed with clinical judgement   ADL Overall ADL's : Needs assistance/impaired Eating/Feeding: Independent   Grooming: Min guard;Standing   Upper Body Bathing: Set up;Sitting   Lower Body Bathing: Minimal assistance;Sit to/from stand   Upper Body Dressing : Set up;Sitting   Lower Body Dressing: Minimal assistance;Sit to/from stand   Toilet Transfer: Minimal assistance;Ambulation;BSC;RW           Functional mobility during ADLs: Min guard;Rolling walker       Vision Baseline Vision/History: Wears glasses Patient Visual Report: No change from baseline       Perception     Praxis      Pertinent Vitals/Pain Pain Assessment: Faces Faces Pain Scale: Hurts little more Pain Location: L hip with transfers Pain Descriptors / Indicators: Sore Pain Intervention(s): Monitored during session     Hand Dominance Right   Extremity/Trunk Assessment Upper Extremity Assessment Upper Extremity Assessment: Overall WFL for tasks assessed   Lower Extremity Assessment Lower Extremity Assessment: Defer to PT evaluation LLE Deficits / Details: pt with decreased strength and ROM limitations secondary to post-op pain and weakness       Communication Communication Communication: No difficulties   Cognition Arousal/Alertness: Awake/alert Behavior During Therapy: WFL for tasks assessed/performed Overall Cognitive Status: Within Functional Limits for tasks assessed  General Comments       Exercises     Shoulder Instructions      Home Living Family/patient expects to be discharged to:: Private residence Living Arrangements: Alone Available Help at Discharge: Family;Available 24 hours/day Type of Home: Apartment Home Access: Level entry     Home Layout: One level     Bathroom Shower/Tub: Teacher, early years/pre: Standard     Home Equipment: Environmental consultant - 2 wheels;Cane - single  point          Prior Functioning/Environment Level of Independence: Independent with assistive device(s)        Comments: ambulated with a walker        OT Problem List: Impaired balance (sitting and/or standing);Decreased knowledge of use of DME or AE;Pain      OT Treatment/Interventions: Self-care/ADL training;DME and/or AE instruction;Patient/family education;Balance training;Therapeutic activities    OT Goals(Current goals can be found in the care plan section) Acute Rehab OT Goals Patient Stated Goal: decrease pain OT Goal Formulation: With patient Time For Goal Achievement: 11/05/19 Potential to Achieve Goals: Good  OT Frequency: Min 2X/week   Barriers to D/C:            Co-evaluation PT/OT/SLP Co-Evaluation/Treatment: Yes Reason for Co-Treatment: For patient/therapist safety PT goals addressed during session: Mobility/safety with mobility;Balance;Proper use of DME;Strengthening/ROM OT goals addressed during session: ADL's and self-care;Proper use of Adaptive equipment and DME      AM-PAC OT "6 Clicks" Daily Activity     Outcome Measure Help from another person eating meals?: None Help from another person taking care of personal grooming?: A Little Help from another person toileting, which includes using toliet, bedpan, or urinal?: A Little Help from another person bathing (including washing, rinsing, drying)?: A Little Help from another person to put on and taking off regular upper body clothing?: None Help from another person to put on and taking off regular lower body clothing?: A Little 6 Click Score: 20   End of Session Equipment Utilized During Treatment: Gait belt;Rolling walker  Activity Tolerance: Patient tolerated treatment well Patient left: in chair;with call bell/phone within reach;with chair alarm set  OT Visit Diagnosis: Unsteadiness on feet (R26.81);Other abnormalities of gait and mobility (R26.89);Muscle weakness (generalized) (M62.81);Pain                 Time: [id number] OT Time Calculation (min): 32 min Charges:  OT General Charges $OT Visit: 1 Visit OT Evaluation $OT Eval Moderate Complexity: 1 Mod  Nestor Lewandowsky, OTR/L Acute Rehabilitation Services Pager: [phone] Office: [phone]  Malka So 10/22/2019, 1:13 PM

## 2019-10-23 LAB — TYPE AND SCREEN
ABO/RH(D): B NEG
Antibody Screen: NEGATIVE
Unit division: 0
Unit division: 0

## 2019-10-23 LAB — BPAM RBC
Blood Product Expiration Date: 202012182359
Blood Product Expiration Date: 202012272359
ISSUE DATE / TIME: 202012110836
ISSUE DATE / TIME: 202012111220
Unit Type and Rh: 1700
Unit Type and Rh: 1700

## 2019-10-23 LAB — BASIC METABOLIC PANEL
Anion gap: 10 (ref 5–15)
BUN: 24 mg/dL — ABNORMAL HIGH (ref 8–23)
CO2: 24 mmol/L (ref 22–32)
Calcium: 7.8 mg/dL — ABNORMAL LOW (ref 8.9–10.3)
Chloride: 101 mmol/L (ref 98–111)
Creatinine, Ser: 1 mg/dL (ref 0.61–1.24)
GFR calc Af Amer: >60 mL/min (ref >60)
GFR calc non Af Amer: >60 mL/min (ref >60)
Glucose, Bld: 125 mg/dL — ABNORMAL HIGH (ref 70–99)
Potassium: 4.2 mmol/L (ref 3.5–5.1)
Sodium: 135 mmol/L (ref 135–145)

## 2019-10-23 LAB — CBC
HCT: 24.2 % — ABNORMAL LOW (ref 39.0–52.0)
Hemoglobin: 8.5 g/dL — ABNORMAL LOW (ref 13.0–17.0)
MCH: 30.8 pg (ref 26.0–34.0)
MCHC: 35.1 g/dL (ref 30.0–36.0)
MCV: 87.7 fL (ref 80.0–100.0)
Platelets: 33 10*3/uL — ABNORMAL LOW (ref 150–400)
RBC: 2.76 MIL/uL — ABNORMAL LOW (ref 4.22–5.81)
RDW: 16.4 % — ABNORMAL HIGH (ref 11.5–15.5)
WBC: 3.6 10*3/uL — ABNORMAL LOW (ref 4.0–10.5)
nRBC: 12.4 % — ABNORMAL HIGH (ref 0.0–0.2)

## 2019-10-23 NOTE — Progress Notes (Signed)
Subjective: 2 Days Post-Op Procedure(s) (LRB): LEFT INTERTROCHANTERIC INTRAMEDULLARY (IM) NAIL (Left) Patient reports pain as moderate.  No complaints.   Objective: Vital signs in last 24 hours: Temp:  [98.4 F (36.9 C)-99.4 F (37.4 C)] 99.4 F (37.4 C) (12/12 0834) Pulse Rate:  [90-109] 109 (12/12 0834) Resp:  [14-18] 16 (12/12 0834) BP: (109-136)/(57-79) 136/76 (12/12 0834) SpO2:  [93 %-100 %] 93 % (12/12 0834)  Intake/Output from previous day: 12/11 0701 - 12/12 0700 In: 1936.2 [P.O.:720; I.V.:198.7; Blood:1017.5] Out: 1750 [Urine:1750] Intake/Output this shift: Total I/O In: 240 [P.O.:240] Out: -   Recent Labs    10/20/19 2114 10/21/19 0700 10/22/19 0538 10/22/19 1713 10/23/19 0624  HGB 9.4* 8.5* 6.1* 9.3* 8.5*   Recent Labs    10/22/19 0538 10/22/19 1713 10/23/19 0624  WBC 3.5*  --  3.6*  RBC 2.03*  --  2.76*  HCT 18.9* 28.1* 24.2*  PLT 38*  --  33*   Recent Labs    10/22/19 0538 10/23/19 0624  NA 137 135  K 4.8 4.2  CL 104 101  CO2 23 24  BUN 15 24*  CREATININE 0.90 1.00  GLUCOSE 165* 125*  CALCIUM 8.4* 7.8*   Recent Labs    10/21/19 0700  INR 1.0   Compartments soft bilateral calves Incision sites clean and dry except middle incision with moderate bloody drainage    Assessment/Plan: 2 Days Post-Op Procedure(s) (LRB): LEFT INTERTROCHANTERIC INTRAMEDULLARY (IM) NAIL (Left) Up with therapy WBAT LLE Middle bandage changed ABLA: Hgb improved to 8.5.      GILBERT CLARK 10/23/2019, 9:11 AM

## 2019-10-23 NOTE — Progress Notes (Signed)
Occupational Therapy Treatment Patient Details Name: Austin Oconnor MRN: [id number] DOB: May 05, 1941 Today's Date: 10/23/2019    History of present illness Pt is a 78 y/o male admitted secondary to pathological L intertrochanteric fracture s/p IM nail. PMH including but not limited to metastatic prostate cancer.    OT comments  Pt making steady progress towards OT goals this session. Session focus on LB AE for bathing and dressing and functional mobility. Education provided on all LB AE for bathing and dressing. Pt able to don socks from EOB with sock aid with MOD A. Overall, pt requires min guard assist for functional mobility with RW, min guard- supervision for all toileting tasks and supervision for standing grooming. DC plan remains appropriate, will continue to follow acutely per POC.    Follow Up Recommendations  Home health OT    Equipment Recommendations  3 in 1 bedside commode    Recommendations for Other Services      Precautions / Restrictions Precautions Precautions: Fall Restrictions Weight Bearing Restrictions: Yes LLE Weight Bearing: Weight bearing as tolerated       Mobility Bed Mobility Overal bed mobility: Needs Assistance Bed Mobility: Supine to Sit     Supine to sit: HOB elevated;Min assist     General bed mobility comments: increased time and effort, min A for movement of L LE off of bed  Transfers Overall transfer level: Needs assistance Equipment used: Rolling walker (2 wheeled) Transfers: Sit to/from Stand Sit to Stand: From elevated surface;Min guard         General transfer comment: cueing for safe hand placement, bed in elevated position    Balance Overall balance assessment: Needs assistance Sitting-balance support: Feet supported Sitting balance-Leahy Scale: Good     Standing balance support: During functional activity;No upper extremity supported Standing balance-Leahy Scale: Fair Standing balance comment: able to complete  standing grooming with min guard                           ADL either performed or assessed with clinical judgement   ADL Overall ADL's : Needs assistance/impaired     Grooming: Wash/dry hands;Standing;Supervision/safety         Lower Body Bathing Details (indicate cue type and reason): education provided on using LH sponge for LB bathing     Lower Body Dressing: Moderate assistance;Sitting/lateral leans;Cueing for sequencing;With adaptive equipment Lower Body Dressing Details (indicate cue type and reason): MOD A to don socks with sock aid, cues for sequencing of task. education on all LB AE for LB dressing. visual demo of use of reacher to don pants Toilet Transfer: Min guard;Ambulation;RW;Cueing for safety;Supervision/safety;Regular Toilet Toilet Transfer Details (indicate cue type and reason): cues for safety to roll RW over toilet to stand during toileting; supervision for safety       Tub/Shower Transfer Details (indicate cue type and reason): pt reports tub shower at home, education on various seats that could be used to assist with safety. pt unsure if he wants to practice or do sponge baths for a while Functional mobility during ADLs: Min guard;Rolling walker General ADL Comments: session focus on functional mobility with RW, toileting tasks, standing grooming, and LB AE for bathing dressing     Vision Baseline Vision/History: Wears glasses Patient Visual Report: No change from baseline     Perception     Praxis      Cognition Arousal/Alertness: Awake/alert Behavior During Therapy: Flat affect Overall Cognitive Status: Within Functional Limits  for tasks assessed                                          Exercises     Shoulder Instructions       General Comments issued LB AE for bathing dressing    Pertinent Vitals/ Pain       Pain Assessment: Faces Faces Pain Scale: Hurts little more Pain Location: L hip when sittting Pain  Descriptors / Indicators: Sore Pain Intervention(s): Limited activity within patient's tolerance;Monitored during session;Repositioned  Home Living                                          Prior Functioning/Environment              Frequency  Min 2X/week        Progress Toward Goals  OT Goals(current goals can now be found in the care plan section)  Progress towards OT goals: Progressing toward goals  Acute Rehab OT Goals Patient Stated Goal: decrease pain OT Goal Formulation: With patient Time For Goal Achievement: 11/05/19 Potential to Achieve Goals: Good  Plan Discharge plan remains appropriate    Co-evaluation                 AM-PAC OT "6 Clicks" Daily Activity     Outcome Measure   Help from another person eating meals?: None Help from another person taking care of personal grooming?: A Little Help from another person toileting, which includes using toliet, bedpan, or urinal?: A Little Help from another person bathing (including washing, rinsing, drying)?: A Little Help from another person to put on and taking off regular upper body clothing?: None Help from another person to put on and taking off regular lower body clothing?: A Little 6 Click Score: 20    End of Session Equipment Utilized During Treatment: Gait belt;Rolling walker  OT Visit Diagnosis: Unsteadiness on feet (R26.81);Other abnormalities of gait and mobility (R26.89);Muscle weakness (generalized) (M62.81);Pain   Activity Tolerance Patient tolerated treatment well   Patient Left in chair;with call bell/phone within reach   Nurse Communication Mobility status        Time: [id number] OT Time Calculation (min): 27 min  Charges: OT General Charges $OT Visit: 1 Visit OT Treatments $Self Care/Home Management : 23-37 mins  Lanier Clam., COTA/L Acute Rehabilitation Services [phone] [place] 10/23/2019, 11:43 AM

## 2019-10-23 NOTE — Progress Notes (Addendum)
Physical Therapy Treatment Patient Details Name: Austin Oconnor MRN: [id number] DOB: December 26, 1940 Today's Date: 10/23/2019    History of Present Illness Pt is a 78 y/o male admitted secondary to pathological L intertrochanteric fracture s/p IM nail. PMH including but not limited to metastatic prostate cancer.     PT Comments    Pt making steady progress with mobility. He was able to increase his distance ambulated this session with min guard and use of RW. No LOB throughout or need for physical assistance. Pt would continue to benefit from skilled physical therapy services at this time while admitted and after d/c to address the below listed limitations in order to improve overall safety and independence with functional mobility.    Follow Up Recommendations  Home health PT;Supervision for mobility/OOB     Equipment Recommendations  3in1 (PT)    Recommendations for Other Services       Precautions / Restrictions Precautions Precautions: Fall Restrictions Weight Bearing Restrictions: Yes LLE Weight Bearing: Weight bearing as tolerated    Mobility  Bed Mobility Overal bed mobility: Needs Assistance Bed Mobility: Supine to Sit     Supine to sit: HOB elevated;Min assist     General bed mobility comments: pt OOB in recliner chair upon arrival  Transfers Overall transfer level: Needs assistance Equipment used: Rolling walker (2 wheeled) Transfers: Sit to/from Stand Sit to Stand: Min guard         General transfer comment: good technique utilized, min guard for safety with transition  Ambulation/Gait Ambulation/Gait assistance: Counsellor (Feet): 75 Feet Assistive device: Rolling walker (2 wheeled) Gait Pattern/deviations: Step-to pattern;Decreased step length - right;Decreased step length - left;Decreased stance time - left;Decreased stride length;Decreased weight shift to left Gait velocity: decreased   General Gait Details: pt with improved stability  and gait pattern; pt actively attempting to have a wider BoS as he initially was almost stepping on his toes with every step   Stairs             Wheelchair Mobility    Modified Rankin (Stroke Patients Only)       Balance Overall balance assessment: Needs assistance Sitting-balance support: Feet supported Sitting balance-Leahy Scale: Good     Standing balance support: During functional activity Standing balance-Leahy Scale: Fair Standing balance comment: able to complete standing grooming with min guard                            Cognition Arousal/Alertness: Awake/alert Behavior During Therapy: WFL for tasks assessed/performed Overall Cognitive Status: Within Functional Limits for tasks assessed                                        Exercises General Exercises - Lower Extremity Ankle Circles/Pumps: AROM;Strengthening;Left;20 reps;Seated Quad Sets: AROM;Strengthening;Left;10 reps;Seated Long Arc Quad: AROM;Left;10 reps;Seated Hip ABduction/ADduction: AAROM;Left;10 reps;Seated    General Comments General comments (skin integrity, edema, etc.): issued LB AE for bathing dressing      Pertinent Vitals/Pain Pain Assessment: 0-10 Pain Score: 1  Faces Pain Scale: Hurts little more Pain Location: L hip when sittting Pain Descriptors / Indicators: Sore Pain Intervention(s): Monitored during session;Repositioned    Home Living                      Prior Function  PT Goals (current goals can now be found in the care plan section) Acute Rehab PT Goals Patient Stated Goal: decrease pain PT Goal Formulation: With patient Time For Goal Achievement: 11/05/19 Potential to Achieve Goals: Good Progress towards PT goals: Progressing toward goals    Frequency    Min 3X/week      PT Plan Current plan remains appropriate    Co-evaluation              AM-PAC PT "6 Clicks" Mobility   Outcome Measure  Help  needed turning from your back to your side while in a flat bed without using bedrails?: A Little Help needed moving from lying on your back to sitting on the side of a flat bed without using bedrails?: A Little Help needed moving to and from a bed to a chair (including a wheelchair)?: A Little Help needed standing up from a chair using your arms (e.g., wheelchair or bedside chair)?: A Little Help needed to walk in hospital room?: A Little Help needed climbing 3-5 steps with a railing? : A Lot 6 Click Score: 17    End of Session Equipment Utilized During Treatment: Gait belt Activity Tolerance: Patient tolerated treatment well Patient left: in chair;with call bell/phone within reach Nurse Communication: Mobility status PT Visit Diagnosis: Other abnormalities of gait and mobility (R26.89);Pain Pain - Right/Left: Left Pain - part of body: Hip     Time: [id number] PT Time Calculation (min) (ACUTE ONLY): 15 min  Charges:  $Gait Training: 8-22 mins                     Anastasio Champion, DPT  Acute Rehabilitation Services Pager [phone] Office [place] 10/23/2019, 1:06 PM

## 2019-10-24 LAB — CBC
HCT: 22.1 % — ABNORMAL LOW (ref 39.0–52.0)
Hemoglobin: 7.5 g/dL — ABNORMAL LOW (ref 13.0–17.0)
MCH: 30.2 pg (ref 26.0–34.0)
MCHC: 33.9 g/dL (ref 30.0–36.0)
MCV: 89.1 fL (ref 80.0–100.0)
Platelets: 33 10*3/uL — ABNORMAL LOW (ref 150–400)
RBC: 2.48 MIL/uL — ABNORMAL LOW (ref 4.22–5.81)
RDW: 16.2 % — ABNORMAL HIGH (ref 11.5–15.5)
WBC: 3.4 10*3/uL — ABNORMAL LOW (ref 4.0–10.5)
nRBC: 5.1 % — ABNORMAL HIGH (ref 0.0–0.2)

## 2019-10-24 NOTE — Progress Notes (Signed)
Subjective: 3 Days Post-Op Procedure(s) (LRB): LEFT INTERTROCHANTERIC INTRAMEDULLARY (IM) NAIL (Left) Patient reports pain as moderate.  Reported middle incision saturated last night and had to be changed.   Objective: Vital signs in last 24 hours: Temp:  [98.6 F (37 C)-99.7 F (37.6 C)] 98.7 F (37.1 C) (12/13 0843) Pulse Rate:  [91-113] 106 (12/13 0843) Resp:  [17-19] 17 (12/13 0843) BP: (110-124)/(63-66) 114/66 (12/13 0843) SpO2:  [97 %-100 %] 100 % (12/13 0843)  Intake/Output from previous day: 12/12 0701 - 12/13 0700 In: 240 [P.O.:240] Out: 200 [Urine:200] Intake/Output this shift: No intake/output data recorded.  Recent Labs    10/22/19 0538 10/22/19 1713 10/23/19 0624 10/24/19 0419  HGB 6.1* 9.3* 8.5* 7.5*   Recent Labs    10/23/19 0624 10/24/19 0419  WBC 3.6* 3.4*  RBC 2.76* 2.48*  HCT 24.2* 22.1*  PLT 33* 33*   Recent Labs    10/22/19 0538 10/23/19 0624  NA 137 135  K 4.8 4.2  CL 104 101  CO2 23 24  BUN 15 24*  CREATININE 0.90 1.00  GLUCOSE 165* 125*  CALCIUM 8.4* 7.8*   No results for input(s): LABPT, INR in the last 72 hours.  Left lower Extremity: Dorsiflexion/Plantar flexion intact Incision: scant drainage Compartment soft   Assessment/Plan: 3 Days Post-Op Procedure(s) (LRB): LEFT INTERTROCHANTERIC INTRAMEDULLARY (IM) NAIL (Left) Up with therapy  Dressings changed no sign of active bleeding or sign of infection.  ABLA  HgB 7.5 will monitor for symptoms of anemia     Lazette Estala 10/24/2019, 10:13 AM

## 2019-10-25 LAB — CBC
HCT: 20.9 % — ABNORMAL LOW (ref 39.0–52.0)
Hemoglobin: 7.1 g/dL — ABNORMAL LOW (ref 13.0–17.0)
MCH: 30.1 pg (ref 26.0–34.0)
MCHC: 34 g/dL (ref 30.0–36.0)
MCV: 88.6 fL (ref 80.0–100.0)
Platelets: 43 10*3/uL — ABNORMAL LOW (ref 150–400)
RBC: 2.36 MIL/uL — ABNORMAL LOW (ref 4.22–5.81)
RDW: 15.6 % — ABNORMAL HIGH (ref 11.5–15.5)
WBC: 3.5 10*3/uL — ABNORMAL LOW (ref 4.0–10.5)
nRBC: 6.1 % — ABNORMAL HIGH (ref 0.0–0.2)

## 2019-10-25 LAB — SURGICAL PATHOLOGY

## 2019-10-25 MED ORDER — CEPHALEXIN 500 MG PO CAPS
500.0000 mg | ORAL_CAPSULE | Freq: Four times a day (QID) | ORAL | Status: DC
  Administered 2019-10-25 – 2019-10-27 (×10): 500 mg via ORAL
  Filled 2019-10-25 (×10): qty 1

## 2019-10-25 MED ORDER — CEPHALEXIN 500 MG PO CAPS: 500.0000 mg | ORAL_CAPSULE | Freq: Four times a day (QID) | ORAL | 0 refills | Status: DC

## 2019-10-25 NOTE — Progress Notes (Signed)
Subjective: 4 Days Post-Op Procedure(s) (LRB): LEFT INTERTROCHANTERIC INTRAMEDULLARY (IM) NAIL (Left) Patient reports pain as mild.    Objective: Vital signs in last 24 hours: Temp:  [97.8 F (36.6 C)-98.8 F (37.1 C)] 98.2 F (36.8 C) (12/14 0350) Pulse Rate:  [91-96] 92 (12/14 0350) Resp:  [16-17] 16 (12/14 0350) BP: (104-109)/(53-60) 104/53 (12/14 0350) SpO2:  [99 %] 99 % (12/14 0350)  Intake/Output from previous day: No intake/output data recorded. Intake/Output this shift: Total I/O In: -  Out: 300 [Urine:300]  Recent Labs    10/22/19 1713 10/23/19 0624 10/24/19 0419  HGB 9.3* 8.5* 7.5*   Recent Labs    10/23/19 0624 10/24/19 0419  WBC 3.6* 3.4*  RBC 2.76* 2.48*  HCT 24.2* 22.1*  PLT 33* 33*   Recent Labs    10/23/19 0624  NA 135  K 4.2  CL 101  CO2 24  BUN 24*  CREATININE 1.00  GLUCOSE 125*  CALCIUM 7.8*   No results for input(s): LABPT, INR in the last 72 hours.  Neurologically intact Neurovascular intact Sensation intact distally Intact pulses distally Dorsiflexion/Plantar flexion intact Incision: moderate drainage No cellulitis present Compartment soft   Assessment/Plan: 4 Days Post-Op Procedure(s) (LRB): LEFT INTERTROCHANTERIC INTRAMEDULLARY (IM) NAIL (Left) Up with therapy Plan for discharge tomorrow home with hhpt WBAT LLE ABLA- 7.5 yesterday.  Will repeat cbc today.   Currently asymptomatic Will apply wound vac to top two wounds and monitor overnight Will start on keflex qid      Austin Oconnor 10/25/2019, 9:29 AM

## 2019-10-25 NOTE — Progress Notes (Signed)
Upper two incisions continue to ooze blood -reinforeced both.

## 2019-10-25 NOTE — Progress Notes (Signed)
Occupational Therapy Treatment Patient Details Name: Austin Oconnor MRN: [id number] DOB: 1941/11/09 Today's Date: 10/25/2019    History of present illness Pt is a 78 y/o male admitted secondary to pathological L intertrochanteric fracture s/p IM nail. PMH including but not limited to metastatic prostate cancer.    OT comments  Pt progressing well toward stated goals, focused session on tub transfers in therapy gym. Pt able to complete tub transfer with tub bench at min guard level. Bench arranged for appropriate side for pts shower head at home. This makes him have to lead with his L leg (op leg) making the transfer more difficult. Considering this, recommend pt has a tub bench at d/c for safest option for bathing, rather than BSC where he will have to step over the lip of the tub. HHOT remains appropriate. Will continue to follow.   Follow Up Recommendations  Home health OT    Equipment Recommendations  Tub/shower bench    Recommendations for Other Services      Precautions / Restrictions Precautions Precautions: Fall Restrictions Weight Bearing Restrictions: No LLE Weight Bearing: Weight bearing as tolerated       Mobility Bed Mobility               General bed mobility comments: pt OOB in recliner chair upon arrival  Transfers Overall transfer level: Needs assistance Equipment used: Rolling walker (2 wheeled) Transfers: Sit to/from Stand Sit to Stand: Min guard         General transfer comment: min gaurd for safety    Balance Overall balance assessment: Needs assistance Sitting-balance support: Feet supported;No upper extremity supported Sitting balance-Leahy Scale: Good     Standing balance support: During functional activity;No upper extremity supported Standing balance-Leahy Scale: Fair Standing balance comment: can stand statically with min guard assist                           ADL either performed or assessed with clinical judgement    ADL Overall ADL's : Needs assistance/impaired                         Toilet Transfer: Loss adjuster, chartered Details (indicate cue type and reason): simulated with recliner, min guard with correct hand placement without cueing     Tub/ Shower Transfer: Min guard;Tub bench;Rolling walker Tub/Shower Transfer Details (indicate cue type and reason): completed tub transfer with tub bence at min guard level. Suspect pt will have easier time with tub bench considering he would have to step in with his op leg vs strong leg Functional mobility during ADLs: Min guard;Rolling walker General ADL Comments: practiced tub transfers in therapy gym this date     Vision Baseline Vision/History: Wears glasses Patient Visual Report: No change from baseline     Perception     Praxis      Cognition Arousal/Alertness: Awake/alert Behavior During Therapy: WFL for tasks assessed/performed Overall Cognitive Status: Within Functional Limits for tasks assessed                                          Exercises     Shoulder Instructions       General Comments      Pertinent Vitals/ Pain       Pain Assessment: Faces Faces Pain Scale: Hurts little  more Pain Location: L hip Pain Descriptors / Indicators: Sore Pain Intervention(s): Limited activity within patient's tolerance;Monitored during session;Repositioned  Home Living                                          Prior Functioning/Environment              Frequency  Min 2X/week        Progress Toward Goals  OT Goals(current goals can now be found in the care plan section)  Progress towards OT goals: Progressing toward goals  Acute Rehab OT Goals Patient Stated Goal: decrease pain OT Goal Formulation: With patient Time For Goal Achievement: 11/05/19 Potential to Achieve Goals: Good  Plan Discharge plan remains appropriate    Co-evaluation                  AM-PAC OT "6 Clicks" Daily Activity     Outcome Measure   Help from another person eating meals?: None Help from another person taking care of personal grooming?: A Little Help from another person toileting, which includes using toliet, bedpan, or urinal?: A Little Help from another person bathing (including washing, rinsing, drying)?: A Little Help from another person to put on and taking off regular upper body clothing?: None Help from another person to put on and taking off regular lower body clothing?: A Little 6 Click Score: 20    End of Session Equipment Utilized During Treatment: Gait belt;Rolling walker  OT Visit Diagnosis: Unsteadiness on feet (R26.81);Other abnormalities of gait and mobility (R26.89);Muscle weakness (generalized) (M62.81);Pain Pain - Right/Left: Left Pain - part of body: Hip   Activity Tolerance Patient tolerated treatment well   Patient Left in chair;with call bell/phone within reach   Nurse Communication Mobility status        Time: [id number] OT Time Calculation (min): 22 min  Charges: OT General Charges $OT Visit: 1 Visit OT Treatments $Self Care/Home Management : 8-22 mins  Zenovia Jarred, MSOT, OTR/L [place] [facility] Office: [phone]   Zenovia Jarred 10/25/2019, 4:21 PM

## 2019-10-25 NOTE — Progress Notes (Signed)
Physical Therapy Treatment Patient Details Name: Austin Oconnor DOBEK MRN: [id number] DOB: [birth year] Today's Date: 10/25/2019    History of Present Illness Pt is a 78 y/o male admitted secondary to pathological L intertrochanteric fracture s/p IM nail. PMH including but not limited to metastatic prostate cancer.     PT Comments    Pt admitted with above diagnosis. Pt was able to ambulate in hallway and perform exercises all with min guard assist.  Pt progressing well.  Will continue PT.   Pt currently with functional limitations due to balance and endurance deficits. Pt will benefit from skilled PT to increase their independence and safety with mobility to allow discharge to the venue listed below.     Follow Up Recommendations  Home health PT;Supervision for mobility/OOB     Equipment Recommendations  3in1 (PT)    Recommendations for Other Services       Precautions / Restrictions Precautions Precautions: Fall Restrictions Weight Bearing Restrictions: No LLE Weight Bearing: Weight bearing as tolerated    Mobility  Bed Mobility               General bed mobility comments: pt OOB in recliner chair upon arrival  Transfers Overall transfer level: Needs assistance Equipment used: Rolling walker (2 wheeled) Transfers: Sit to/from Stand Sit to Stand: Min guard         General transfer comment: good technique utilized, min guard for safety with transition but no help given  Ambulation/Gait Ambulation/Gait assistance: Min Gaffer (Feet): 250 Feet Assistive device: Rolling walker (2 wheeled) Gait Pattern/deviations: Step-to pattern;Decreased step length - right;Decreased step length - left;Decreased stance time - left;Decreased stride length;Decreased weight shift to left Gait velocity: decreased Gait velocity interpretation: <1.31 ft/sec, indicative of household ambulator General Gait Details: pt with improved stability and gait pattern; pt  actively attempting to have a wider BoS as first few steps  he was almost stepping on his toes with every step. Pt improved the sequencing after only a few steps.   Stairs             Wheelchair Mobility    Modified Rankin (Stroke Patients Only)       Balance Overall balance assessment: Needs assistance Sitting-balance support: Feet supported;No upper extremity supported Sitting balance-Leahy Scale: Good     Standing balance support: During functional activity;No upper extremity supported Standing balance-Leahy Scale: Fair Standing balance comment: can stand statically with min guard assist                            Cognition Arousal/Alertness: Awake/alert Behavior During Therapy: WFL for tasks assessed/performed Overall Cognitive Status: Within Functional Limits for tasks assessed                                        Exercises General Exercises - Lower Extremity Ankle Circles/Pumps: AROM;Strengthening;Left;20 reps;Seated Quad Sets: AROM;Strengthening;Left;10 reps;Seated Long Arc Quad: AROM;Left;10 reps;Seated Heel Slides: Left;5 reps;AAROM;Supine Hip ABduction/ADduction: AAROM;Left;Seated;Standing;5 reps Hip Flexion/Marching: AROM;Left;5 reps;Standing Other Exercises Other Exercises: knee flexion x 5 in standing    General Comments        Pertinent Vitals/Pain Pain Assessment: Faces Faces Pain Scale: Hurts little more Pain Location: L hip when sittting Pain Descriptors / Indicators: Sore Pain Intervention(s): Limited activity within patient's tolerance;Monitored during session;Repositioned    Home Living  Prior Function            PT Goals (current goals can now be found in the care plan section) Acute Rehab PT Goals Patient Stated Goal: decrease pain Progress towards PT goals: Progressing toward goals    Frequency    Min 3X/week      PT Plan Current plan remains appropriate     Co-evaluation              AM-PAC PT "6 Clicks" Mobility   Outcome Measure  Help needed turning from your back to your side while in a flat bed without using bedrails?: A Little Help needed moving from lying on your back to sitting on the side of a flat bed without using bedrails?: A Little Help needed moving to and from a bed to a chair (including a wheelchair)?: A Little Help needed standing up from a chair using your arms (e.g., wheelchair or bedside chair)?: A Little Help needed to walk in hospital room?: A Little Help needed climbing 3-5 steps with a railing? : A Lot 6 Click Score: 17    End of Session Equipment Utilized During Treatment: Gait belt Activity Tolerance: Patient tolerated treatment well Patient left: in chair;with call bell/phone within reach;with chair alarm set Nurse Communication: Mobility status PT Visit Diagnosis: Other abnormalities of gait and mobility (R26.89);Pain Pain - Right/Left: Left Pain - part of body: Hip     Time: [id number] PT Time Calculation (min) (ACUTE ONLY): 24 min  Charges:  $Gait Training: 8-22 mins $Therapeutic Exercise: 8-22 mins                     Rhyder Koegel W,PT Acute Rehabilitation Services Pager:  [phone]  Office:  [place] 10/25/2019, 11:53 AM

## 2019-10-25 NOTE — Progress Notes (Signed)
iVAC placed on top 2 incisions.  Will monitor output.  No d/c today.

## 2019-10-26 ENCOUNTER — Telehealth: Payer: Self-pay | Admitting: Orthopaedic Surgery

## 2019-10-26 NOTE — Progress Notes (Addendum)
Subjective: 5 Days Post-Op Procedure(s) (LRB): LEFT INTERTROCHANTERIC INTRAMEDULLARY (IM) NAIL (Left) Patient reports pain as mild.    Objective: Vital signs in last 24 hours: Temp:  [98.9 F (37.2 C)-100.1 F (37.8 C)] 99.6 F (37.6 C) (12/15 0350) Pulse Rate:  [91-108] 99 (12/15 0737) Resp:  [15-17] 15 (12/15 0737) BP: (101-140)/(54-66) 140/66 (12/15 0737) SpO2:  [96 %-100 %] 96 % (12/15 0737)  Intake/Output from previous day: 12/14 0701 - 12/15 0700 In: -  Out: 850 [Urine:850] Intake/Output this shift: No intake/output data recorded.  Recent Labs    10/24/19 0419 10/25/19 1033  HGB 7.5* 7.1*   Recent Labs    10/24/19 0419 10/25/19 1033  WBC 3.4* 3.5*  RBC 2.48* 2.36*  HCT 22.1* 20.9*  PLT 33* 43*   No results for input(s): NA, K, CL, CO2, BUN, CREATININE, GLUCOSE, CALCIUM in the last 72 hours. No results for input(s): LABPT, INR in the last 72 hours.  Neurologically intact Neurovascular intact Sensation intact distally Intact pulses distally Dorsiflexion/Plantar flexion intact Incision: dressing C/D/I No cellulitis present Compartment soft Wound vac functioning, but no drainage in canister  Assessment/Plan: 5 Days Post-Op Procedure(s) (LRB): LEFT INTERTROCHANTERIC INTRAMEDULLARY (IM) NAIL (Left) Advance diet Up with therapy Discharge home with home health possibly today WBAT LLE ABLA- 9.4 at baseline.  7.1 yesterday.  Will hold off on transfusion for now as not symptomatic.   Will continue wound vac thru the morning.  If still no drainage, will call nurse for d/c and likely d/c home.      [name] 10/26/2019, 7:56 AM

## 2019-10-26 NOTE — Progress Notes (Signed)
Notified Dr. Erlinda Hong that patient's daughter, Shelton Silvas would like to be called @ [phone] for patient updates.  She was getting a call daily and then they stopped.

## 2019-10-26 NOTE — Progress Notes (Signed)
Order placed for wound vac removal and placement of dry dressing.  If incisions remain dry tomorrow then he can d/c home.  Daughter updated with plan.

## 2019-10-26 NOTE — Progress Notes (Signed)
Physical Therapy Treatment Patient Details Name: Austin Oconnor MRN: [id number] DOB: [birth year] Today's Date: 10/26/2019    History of Present Illness Pt is a 78 y/o male admitted secondary to pathological L intertrochanteric fracture s/p IM nail. PMH including but not limited to metastatic prostate cancer.     PT Comments    Pt admitted for above. Pt requires min assist for bed mobility in order to bring L LE off the EOB, and he requires min guard for transfers and ambulation. He demonstrated improvement in functional mobility today by increasing his gait distance to 400 ft with minimal pain increase. He requires cueing to maintain wide BOS and posture throughout gait as he has a tendency to walk with narrow and almost scissored gait pattern, particularly with turns, as well as flexed posture. Pt would benefit from continued skilled acute PT in order to address deficits.   Follow Up Recommendations  Home health PT;Supervision for mobility/OOB     Equipment Recommendations  3in1 (PT)    Recommendations for Other Services       Precautions / Restrictions Restrictions Weight Bearing Restrictions: No LLE Weight Bearing: Weight bearing as tolerated    Mobility  Bed Mobility Overal bed mobility: Needs Assistance Bed Mobility: Supine to Sit     Supine to sit: HOB elevated;Min assist     General bed mobility comments: min assist for L LE management to move to EOB  Transfers Overall transfer level: Needs assistance Equipment used: Rolling walker (2 wheeled) Transfers: Sit to/from Stand Sit to Stand: Min guard         General transfer comment: min gaurd for safety from bed and toilet  Ambulation/Gait Ambulation/Gait assistance: Min guard Gait Distance (Feet): 400 Feet Assistive device: Rolling walker (2 wheeled) Gait Pattern/deviations: Step-to pattern;Decreased step length - right;Decreased step length - left;Decreased stance time - left;Decreased stride length;Decreased  weight shift to left;Narrow base of support Gait velocity: decreased   General Gait Details: pt with improved stability and gait pattern as ambulation progressed; he occasionally verbally self-cues himself to maintain a wide BOS and needed cueing from SPT to do so, particularly during turns; pt requires cueing for posture as he tends to walk with flexed trunk; no LOB or unsteadiness; pt ambulated 400 ft total from bed to bathroom and then in hall   Stairs             Wheelchair Mobility    Modified Rankin (Stroke Patients Only)       Balance Overall balance assessment: Needs assistance Sitting-balance support: Feet supported;No upper extremity supported Sitting balance-Leahy Scale: Good Sitting balance - Comments: maintain sitting balance at EOB for don/doff gait belt   Standing balance support: During functional activity;No upper extremity supported Standing balance-Leahy Scale: Fair Standing balance comment: can stand statically with min guard assist (at sink for hand washing)                            Cognition Arousal/Alertness: Awake/alert Behavior During Therapy: WFL for tasks assessed/performed Overall Cognitive Status: Within Functional Limits for tasks assessed                                        Exercises      General Comments        Pertinent Vitals/Pain Pain Assessment: Faces Faces Pain Scale: Hurts a little bit  Pain Location: L hip Pain Descriptors / Indicators: Sore Pain Intervention(s): Limited activity within patient's tolerance;Monitored during session;Repositioned    Home Living                      Prior Function            PT Goals (current goals can now be found in the care plan section) Acute Rehab PT Goals Patient Stated Goal: decrease pain PT Goal Formulation: With patient Time For Goal Achievement: 11/05/19 Potential to Achieve Goals: Good Progress towards PT goals: Progressing toward  goals    Frequency    Min 3X/week      PT Plan Current plan remains appropriate    Co-evaluation              AM-PAC PT "6 Clicks" Mobility   Outcome Measure  Help needed turning from your back to your side while in a flat bed without using bedrails?: A Little Help needed moving from lying on your back to sitting on the side of a flat bed without using bedrails?: A Little Help needed moving to and from a bed to a chair (including a wheelchair)?: A Little Help needed standing up from a chair using your arms (e.g., wheelchair or bedside chair)?: A Little Help needed to walk in hospital room?: A Little Help needed climbing 3-5 steps with a railing? : A Lot 6 Click Score: 17    End of Session Equipment Utilized During Treatment: Gait belt Activity Tolerance: Patient tolerated treatment well Patient left: in chair;with call bell/phone within reach Nurse Communication: Mobility status PT Visit Diagnosis: Other abnormalities of gait and mobility (R26.89);Pain Pain - Right/Left: Left Pain - part of body: Hip     Time: [id number] PT Time Calculation (min) (ACUTE ONLY): 31 min  Charges:  $Gait Training: 8-22 mins $Therapeutic Activity: 8-22 mins                    Austin Oconnor, SPT   Austin Oconnor 10/26/2019, 11:33 AM

## 2019-10-26 NOTE — Progress Notes (Signed)
Wound Vac D/C as ordered.  Wound is clean and dry, staples intact.  Silicone foam dressing placed on wound, no drainage at time of dressing change.  Patient tolerated procedure well.

## 2019-10-26 NOTE — Telephone Encounter (Signed)
Pt daughter Shelton Silvas called in requesting a call back from [facility] or dr.xu in regards to her father getting discharged she wants to make sure he has all the care ne needs.   [phone]

## 2019-10-26 NOTE — Telephone Encounter (Signed)
Left voice mail

## 2019-10-27 LAB — PREPARE RBC (CROSSMATCH)

## 2019-10-27 MED ORDER — SODIUM CHLORIDE 0.9% IV SOLUTION: Freq: Once | INTRAVENOUS | Status: AC

## 2019-10-27 MED ORDER — FUROSEMIDE 10 MG/ML IJ SOLN: 20.0000 mg | Freq: Once | INTRAMUSCULAR | Status: DC

## 2019-10-27 NOTE — Care Management Important Message (Signed)
Important Message  Patient Details  Name: Austin Oconnor MRN: [id number] Date of Birth: [birth year]   Medicare Important Message Given:  Yes     Memory Argue 10/27/2019, 1:43 PM

## 2019-10-27 NOTE — Discharge Summary (Signed)
[name] MRN: [id number] DOB/AGE: [birth year] 77 y.o.  Admit date: 10/20/2019 Discharge date: 10/27/2019  Admission Diagnoses:  Principal Problem:   Pathologic intertrochanteric fracture, left, initial encounter [facility]) Active Problems:   Essential hypertension   Pancytopenia (Austin Oconnor)   Cardiomegaly   Discharge Diagnoses:  Same  Past Medical History:  Diagnosis Date  . At risk for sleep apnea    STOP-BANG= 4      . Elevated PSA   . Hypertension   . Nocturia   . Prostate cancer ([place])   . Wears partial dentures     Surgeries: Procedure(s): LEFT INTERTROCHANTERIC INTRAMEDULLARY (IM) NAIL on 10/21/2019   Consultants:   Discharged Condition: Improved  Hospital Course: Austin Oconnor is an 78 y.o. male who was admitted 10/20/2019 for operative treatment ofPathologic intertrochanteric fracture, left, initial encounter ([place]). Patient has severe unremitting pain that affects sleep, daily activities, and work/hobbies. After pre-op clearance the patient was taken to the operating room on 10/21/2019 and underwent  Procedure(s): LEFT INTERTROCHANTERIC INTRAMEDULLARY (IM) NAIL.  Patient developed ABLA.  Transfused initially with 2 units prbc, but a few days later hgb trended back down.  Transfused with one more unit prbc prior to d/c  Patient was given perioperative antibiotics:  Anti-infectives (From admission, onward)   Start     Dose/Rate Route Frequency Ordered Stop   10/25/19 1100  cephALEXin (KEFLEX) capsule 500 mg     500 mg Oral Every 6 hours 10/25/19 0930     10/25/19 0000  cephALEXin (KEFLEX) 500 MG capsule     500 mg Oral 4 times daily 10/25/19 0934     10/21/19 1530  ceFAZolin (ANCEF) IVPB 2g/100 mL premix     2 g 200 mL/hr over 30 Minutes Intravenous Every 6 hours 10/21/19 1517 10/22/19 0531   10/21/19 1100  ceFAZolin (ANCEF) IVPB 2g/100 mL premix     2 g 200 mL/hr over 30 Minutes Intravenous On call to O.R. 10/21/19 1049 10/21/19 1235       Patient was  given sequential compression devices, early ambulation, and chemoprophylaxis to prevent DVT.  Patient benefited maximally from hospital stay and there were no complications.    Recent vital signs:  Patient Vitals for the past 24 hrs:  BP Temp Temp src Pulse Resp SpO2  10/27/19 0746 (!) 120/59 99.3 F (37.4 C) Oral (!) 124 16 99 %  10/27/19 0312 (!) 130/58 98.4 F (36.9 C) Oral 90 -- 96 %  10/26/19 1951 108/67 98.2 F (36.8 C) Oral 92 -- 100 %  10/26/19 1318 122/60 98.5 F (36.9 C) Oral (!) 108 16 100 %     Recent laboratory studies:  Recent Labs    10/25/19 1033  WBC 3.5*  HGB 7.1*  HCT 20.9*  PLT 43*     Discharge Medications:   Allergies as of 10/27/2019   No Known Allergies     Medication List    STOP taking these medications   lidocaine-prilocaine cream Commonly known as: EMLA   prochlorperazine 10 MG tablet Commonly known as: COMPAZINE     TAKE these medications   alendronate 70 MG tablet Commonly known as: FOSAMAX Take 70 mg by mouth once a week.   aspirin 325 MG tablet Commonly known as: Bayer Aspirin Take 1 tablet (325 mg total) by mouth daily.   calcium-vitamin D 500-200 MG-UNIT tablet Commonly known as: OSCAL WITH D Take 1 tablet by mouth 3 (three) times daily.   cephALEXin 500 MG capsule Commonly  known as: Keflex Take 1 capsule (500 mg total) by mouth 4 (four) times daily.   fentaNYL 25 MCG/HR Commonly known as: [place] 1 patch onto the skin every 3 (three) days.   lisinopril 10 MG tablet Commonly known as: ZESTRIL Take 1 tablet by mouth every evening.   methocarbamol 750 MG tablet Commonly known as: ROBAXIN Take 1 tablet (750 mg total) by mouth 2 (two) times daily as needed for muscle spasms.   oxyCODONE 5 MG immediate release tablet Commonly known as: Oxy IR/ROXICODONE Take 1 tablet (5 mg total) by mouth every 4 (four) hours as needed for severe pain.   oxyCODONE-acetaminophen 5-325 MG tablet Commonly known as:  Percocet Take 1-2 tablets by mouth every 8 (eight) hours as needed for severe pain.   vitamin B-12 1000 MCG tablet Commonly known as: CYANOCOBALAMIN Take 1,000 mcg by mouth daily.   Vitamin D3 25 MCG (1000 UT) Caps Take 1 capsule by mouth daily.   zinc sulfate 220 (50 Zn) MG capsule Take 1 capsule (220 mg total) by mouth daily.     ASK your doctor about these medications   oxyCODONE 10 mg 12 hr tablet Commonly known as: OXYCONTIN Take 1 tablet (10 mg total) by mouth every 12 (twelve) hours for 3 days. Ask about: Should I take this medication?       Diagnostic Studies: DG Chest Port 1 View  Result Date: 10/20/2019 CLINICAL DATA:  Preop for left hip fracture. Pain. EXAM: PORTABLE CHEST 1 VIEW COMPARISON:  12/19/2018 FINDINGS: The heart size is moderately enlarged. There is no pneumothorax. No large pleural effusion. No large focal infiltrate. There are advanced degenerative changes of the right glenohumeral joint. IMPRESSION: Cardiomegaly without edema or large pleural effusion. Electronically Signed   By: Constance Holster M.D.   On: 10/20/2019 21:50   DG C-Arm 1-60 Min  Result Date: 10/21/2019 CLINICAL DATA:  Status post surgical internal fixation of left femur fracture. EXAM: LEFT FEMUR 2 VIEWS; DG C-ARM 1-60 MIN Radiation exposure index: 15.023 mGy. COMPARISON:  October 18, 2019. FINDINGS: Five intraoperative fluoroscopic images were obtained of the left femur. These images demonstrate surgical internal fixation of left femoral neck fracture as well as intramedullary rod fixation of the left femur. IMPRESSION: Fluoroscopic guidance provided during surgical internal fixation of proximal left femoral fracture. Electronically Signed   By: Marijo Conception M.D.   On: 10/21/2019 14:44   ECHOCARDIOGRAM COMPLETE  Result Date: 10/21/2019   ECHOCARDIOGRAM REPORT   Patient Name:   Austin Oconnor Date of Exam: 10/21/2019 Medical Rec #:  [id number]     Height:       68.0 in Accession #:     [id number]    Weight:       152.0 lb Date of Birth:  1941-08-15     BSA:          1.82 m Patient Age:    28 years      BP:           158/86 mmHg Patient Gender: M             HR:           77 bpm. Exam Location:  Inpatient Procedure: 2D Echo STAT ECHO Indications:    Cardiomegaly 429.3 / I51.7  History:        Patient has no prior history of Echocardiogram examinations.                 Risk  Factors:Hypertension. Metastatic prostate cancer, left                 femoral neck fracture.  Sonographer:    Darlina Sicilian RDCS Referring Phys: [id number] [facility] XU  Sonographer Comments: Echo for pre-op IMPRESSIONS  1. Left ventricular ejection fraction, by visual estimation, is 60 to 65%. The left ventricle has normal function. There is mildly increased left ventricular hypertrophy.  2. Left ventricular diastolic parameters are consistent with Grade I diastolic dysfunction (impaired relaxation).  3. The left ventricle has no regional wall motion abnormalities.  4. Global right ventricle has normal systolic function.The right ventricular size is normal.  5. Left atrial size was normal.  6. Right atrial size was normal.  7. The mitral valve is normal in structure. Trivial mitral valve regurgitation. No evidence of mitral stenosis.  8. The tricuspid valve is normal in structure. Tricuspid valve regurgitation is mild.  9. The aortic valve is tricuspid. Aortic valve regurgitation is trivial. No evidence of aortic valve sclerosis or stenosis. 10. The pulmonic valve was normal in structure. Pulmonic valve regurgitation is trivial. 11. Mildly elevated pulmonary artery systolic pressure. 12. The inferior vena cava is normal in size with greater than 50% respiratory variability, suggesting right atrial pressure of 3 mmHg. 13. Normal LV systolic function; grade 1 diastolic dysfunction; mild LVH; trace AI and MR; mild TR; mildly elevated pulmonary pressure. FINDINGS  Left Ventricle: Left ventricular ejection fraction, by visual  estimation, is 60 to 65%. The left ventricle has normal function. The left ventricle has no regional wall motion abnormalities. There is mildly increased left ventricular hypertrophy. Left ventricular diastolic parameters are consistent with Grade I diastolic dysfunction (impaired relaxation). Normal left atrial pressure. Right Ventricle: The right ventricular size is normal.Global RV systolic function is has normal systolic function. The tricuspid regurgitant velocity is 2.84 m/s, and with an assumed right atrial pressure of 3 mmHg, the estimated right ventricular systolic pressure is mildly elevated at 35.4 mmHg. Left Atrium: Left atrial size was normal in size. Right Atrium: Right atrial size was normal in size Pericardium: There is no evidence of pericardial effusion. Mitral Valve: The mitral valve is normal in structure. Trivial mitral valve regurgitation. No evidence of mitral valve stenosis by observation. Tricuspid Valve: The tricuspid valve is normal in structure. Tricuspid valve regurgitation is mild. Aortic Valve: The aortic valve is tricuspid. Aortic valve regurgitation is trivial. The aortic valve is structurally normal, with no evidence of sclerosis or stenosis. Pulmonic Valve: The pulmonic valve was normal in structure. Pulmonic valve regurgitation is trivial. Pulmonic regurgitation is trivial. Aorta: The aortic root is normal in size and structure. Venous: The inferior vena cava is normal in size with greater than 50% respiratory variability, suggesting right atrial pressure of 3 mmHg.  Additional Comments: Normal LV systolic function; grade 1 diastolic dysfunction; mild LVH; trace AI and MR; mild TR; mildly elevated pulmonary pressure.  LEFT VENTRICLE PLAX 2D LVIDd:         4.65 cm  Diastology LVIDs:         3.31 cm  LV e' lateral:   7.18 cm/s LV PW:         1.17 cm  LV E/e' lateral: 9.5 LV IVS:        1.39 cm  LV e' medial:    5.11 cm/s LVOT diam:     2.20 cm  LV E/e' medial:  13.4 LV SV:          55 ml  LV SV Index:   30.39 LVOT Area:     3.80 cm  RIGHT VENTRICLE RV S prime:     16.20 cm/s TAPSE (M-mode): 2.4 cm LEFT ATRIUM             Index       RIGHT ATRIUM           Index LA diam:        3.60 cm 1.98 cm/m  RA Area:     11.90 cm LA Vol (A2C):   60.9 ml 33.48 ml/m RA Volume:   20.00 ml  11.00 ml/m LA Vol (A4C):   51.1 ml 28.10 ml/m LA Biplane Vol: 59.1 ml 32.49 ml/m  AORTIC VALVE LVOT Vmax:   113.00 cm/s LVOT Vmean:  65.500 cm/s LVOT VTI:    0.221 m  AORTA Ao Root diam: 3.50 cm Ao Asc diam:  3.60 cm MITRAL VALVE                         TRICUSPID VALVE MV Area (PHT): 5.62 cm              TR Peak grad:   32.4 mmHg MV PHT:        39.15 msec            TR Vmax:        295.00 cm/s MV Decel Time: 135 msec MV E velocity: 68.40 cm/s  103 cm/s  SHUNTS MV A velocity: 119.00 cm/s 70.3 cm/s Systemic VTI:  0.22 m MV E/A ratio:  0.57        1.5       Systemic Diam: 2.20 cm  Kirk Ruths MD Electronically signed by Kirk Ruths MD Signature Date/Time: 10/21/2019/9:10:58 AM    Final    DG Hip Unilat W or W/O Pelvis 1 View Left  Result Date: 10/18/2019 CLINICAL DATA:  Pt c/o acute hip pain, no inj, unable to bear weight on left leg, hx metastatic prostate ca.Acute left hip pain, unable to bear weight EXAM: DG HIP (WITH OR WITHOUT PELVIS) 1V*L* COMPARISON:  CT 12/15/2018 Degenerative osteophytosis of the spine. FINDINGS: Multiple sclerotic lesions throughout the LEFT and RIGHT pelvis involving the ischium and iliac bones. There is a subtle cortical disruption along the superior surface of the LEFT femoral neck. There is a linear lucency that extends into the LEFT neck from this level. IMPRESSION: 1. Nondisplaced fracture of the LEFT femoral neck. 2. 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 These results will be called to the ordering clinician or representative by the Radiologist Assistant, and communication documented in the PACS or zVision Dashboard. Electronically  Signed   By: Suzy Bouchard M.D.   On: 10/18/2019 14:16   DG FEMUR MIN 2 VIEWS LEFT  Result Date: 10/21/2019 CLINICAL DATA:  Status post surgical internal fixation of left femur fracture. EXAM: LEFT FEMUR 2 VIEWS; DG C-ARM 1-60 MIN Radiation exposure index: 15.023 mGy. COMPARISON:  October 18, 2019. FINDINGS: Five intraoperative fluoroscopic images were obtained of the left femur. These images demonstrate surgical internal fixation of left femoral neck fracture as well as intramedullary rod fixation of the left femur. IMPRESSION: Fluoroscopic guidance provided during surgical internal fixation of proximal left femoral fracture. Electronically Signed   By: Marijo Conception M.D.   On: 10/21/2019 14:44   DG FEMUR MIN 2 VIEWS LEFT  Result Date: 10/18/2019 CLINICAL DATA:  Acute onset left hip pain and difficulty  bearing weight. No known injury. History of metastatic prostate cancer. EXAM: LEFT FEMUR 2 VIEWS COMPARISON:  None. FINDINGS: The patient has an acute nondisplaced fracture through the base of the left femoral neck. The fracture is nondisplaced and appears incomplete. Sclerosis in the right inferior pubic ramus is consistent with metastatic disease from prostate cancer. IMPRESSION: Acute nondisplaced and incomplete appearing fracture base of the left femoral neck. These results will be called to the ordering clinician or representative by the Radiologist Assistant, and communication documented in the PACS or zVision Dashboard. Electronically Signed   By: Inge Rise M.D.   On: 10/18/2019 14:15    Disposition: Discharge disposition: 01-Home or Self Care         Follow-up Information    Leandrew Koyanagi, MD In 2 weeks.   Specialty: Orthopedic Surgery Why: For suture removal, For wound re-check Contact information: [place] [address] [phone]        Home, [facility] Follow up.   Specialty: [facility] Contact information: [place] [address] [phone]            Signed: [name] 10/27/2019, 8:09 AM

## 2019-10-27 NOTE — Progress Notes (Addendum)
Subjective: 6 Days Post-Op Procedure(s) (LRB): LEFT INTERTROCHANTERIC INTRAMEDULLARY (IM) NAIL (Left) Patient reports pain as mild.  C/o mild dizziness and sob upon standing to use the restroom this am.  No chest pain/pressure/palpitation.  No nausea/vomiting.   Objective: Vital signs in last 24 hours: Temp:  [98.2 F (36.8 C)-99.3 F (37.4 C)] 99.3 F (37.4 C) (12/16 0746) Pulse Rate:  [90-124] 124 (12/16 0746) Resp:  [16] 16 (12/16 0746) BP: (108-130)/(58-67) 120/59 (12/16 0746) SpO2:  [96 %-100 %] 99 % (12/16 0746)  Intake/Output from previous day: 12/15 0701 - 12/16 0700 In: 720 [P.O.:720] Out: 1350 [Urine:1350] Intake/Output this shift: No intake/output data recorded.  Recent Labs    10/25/19 1033  HGB 7.1*   Recent Labs    10/25/19 1033  WBC 3.5*  RBC 2.36*  HCT 20.9*  PLT 43*   No results for input(s): NA, K, CL, CO2, BUN, CREATININE, GLUCOSE, CALCIUM in the last 72 hours. No results for input(s): LABPT, INR in the last 72 hours.  Neurologically intact Neurovascular intact Sensation intact distally Intact pulses distally Dorsiflexion/Plantar flexion intact Incision: scant drainage No cellulitis present Compartment soft   Assessment/Plan: 6 Days Post-Op Procedure(s) (LRB): LEFT INTERTROCHANTERIC INTRAMEDULLARY (IM) NAIL (Left) Advance diet Up with therapy  WBAT LLE Dry dressing change prn Hx of pancytopenia: Hemoglobin- 7.1 yesterday. Will transfuse with one unit prbc this am and then patient may d/c home D/c home with hhpt today following blood transfusion      [name] 10/27/2019, 7:56 AM

## 2019-10-27 NOTE — TOC Transition Note (Signed)
Transition of Care [facility]) - CM/SW Discharge Note   Patient Details  Name: Austin Oconnor MRN: [id number] Date of Birth: 12/25/1940  Transition of Care [facility]) CM/SW Contact:  Atilano Median, LCSW Phone Number: 10/27/2019, 1:08 PM   Clinical Narrative:     Discharged home with HHPT. Referral coordinated with Tiffany. DME delivered to room prior to discharge. No other needs at this time. Case closed to this CSW.   Final next level of care: [place] Barriers to Discharge: Barriers Resolved   Patient Goals and CMS Choice Patient states their goals for this hospitalization and ongoing recovery are:: go back home CMS Medicare.gov Compare Post Acute Care list provided to:: Patient Choice offered to / list presented to : Patient  Discharge Placement                Discharge Plan and Services                DME Arranged: 3-N-1 DME Agency: [employer] Date DME Agency Contacted: 10/27/19 Time DME Agency Contacted: [phone] Representative spoke with at DME Agency: [place]: PT [place]: [facility] [employer] Date [place]: 10/27/19 Time [place]: 1308 Representative spoke with at [place]: [facility]bird Smith ([place]) Interventions     Readmission Risk Interventions No flowsheet data found.

## 2019-10-27 NOTE — Progress Notes (Signed)
Occupational Therapy Treatment Patient Details Name: Austin Oconnor MRN: [id number] DOB: 08-May-1941 Today's Date: 10/27/2019    History of present illness Pt is a 78 y/o male admitted secondary to pathological L intertrochanteric fracture s/p IM nail. PMH including but not limited to metastatic prostate cancer.    OT comments  Pt making good progress with functional goals. Possible d/c home this afternoon. OT will continue to follow acutely  Follow Up Recommendations  Home health OT    Equipment Recommendations  Tub/shower bench;Other (comment)(reacher)    Recommendations for Other Services      Precautions / Restrictions Precautions Precautions: Fall Restrictions Weight Bearing Restrictions: No LLE Weight Bearing: Weight bearing as tolerated       Mobility Bed Mobility               General bed mobility comments: pt in recliner upon arrival  Transfers Overall transfer level: Needs assistance Equipment used: Rolling walker (2 wheeled) Transfers: Sit to/from Stand Sit to Stand: Min guard         General transfer comment: min gaurd for safety    Balance Overall balance assessment: Needs assistance Sitting-balance support: Feet supported;No upper extremity supported Sitting balance-Leahy Scale: Good     Standing balance support: During functional activity;No upper extremity supported Standing balance-Leahy Scale: Fair                             ADL either performed or assessed with clinical judgement   ADL Overall ADL's : Needs assistance/impaired     Grooming: Wash/dry hands;Wash/dry face;Set up;Supervision/safety;Standing       Lower Body Bathing: Minimal assistance;Sit to/from stand       Lower Body Dressing: Moderate assistance;Sitting/lateral leans;Cueing for sequencing;Sit to/from stand   Toilet Transfer: Nature conservation officer;Ambulation;RW;Cueing for safety   Toileting- Clothing Manipulation and Hygiene: Min guard;Sit  to/from stand       Functional mobility during ADLs: Min guard;Rolling walker;Cueing for safety       Vision Baseline Vision/History: Wears glasses Wears Glasses: Reading only Patient Visual Report: No change from baseline     Perception     Praxis      Cognition Arousal/Alertness: Awake/alert Behavior During Therapy: WFL for tasks assessed/performed Overall Cognitive Status: Within Functional Limits for tasks assessed                                          Exercises     Shoulder Instructions       General Comments      Pertinent Vitals/ Pain       Pain Assessment: 0-10 Pain Score: 5  Pain Location: L hip Pain Descriptors / Indicators: Sore Pain Intervention(s): Monitored during session;Repositioned  Home Living                                          Prior Functioning/Environment              Frequency  Min 2X/week        Progress Toward Goals  OT Goals(current goals can now be found in the care plan section)  Progress towards OT goals: Progressing toward goals  Acute Rehab OT Goals Patient Stated Goal: decrease pain, go home  Plan Discharge plan  remains appropriate    Co-evaluation                 AM-PAC OT "6 Clicks" Daily Activity     Outcome Measure   Help from another person eating meals?: None Help from another person taking care of personal grooming?: A Little Help from another person toileting, which includes using toliet, bedpan, or urinal?: A Little Help from another person bathing (including washing, rinsing, drying)?: A Little Help from another person to put on and taking off regular upper body clothing?: None Help from another person to put on and taking off regular lower body clothing?: A Little 6 Click Score: 20    End of Session Equipment Utilized During Treatment: Gait belt;Rolling walker;Other (comment)(3 in 1)  OT Visit Diagnosis: Unsteadiness on feet (R26.81);Other  abnormalities of gait and mobility (R26.89);Muscle weakness (generalized) (M62.81);Pain Pain - Right/Left: Left Pain - part of body: Hip   Activity Tolerance Patient tolerated treatment well   Patient Left in chair;with call bell/phone within reach   Nurse Communication          Time: [id number] OT Time Calculation (min): 23 min  Charges: OT General Charges $OT Visit: 1 Visit OT Treatments $Self Care/Home Management : 8-22 mins $Therapeutic Activity: 8-22 mins     Britt Bottom 10/27/2019, 12:23 PM

## 2019-10-28 LAB — BPAM RBC
Blood Product Expiration Date: 202101032359
ISSUE DATE / TIME: 202012161254
Unit Type and Rh: 1700

## 2019-10-28 LAB — TYPE AND SCREEN
ABO/RH(D): B NEG
Antibody Screen: NEGATIVE
Unit division: 0

## 2019-11-01 ENCOUNTER — Telehealth: Payer: Self-pay | Admitting: Radiology

## 2019-11-01 NOTE — Telephone Encounter (Signed)
Called Nunzio Cory to approve orders.

## 2019-11-01 NOTE — Telephone Encounter (Signed)
Lisabeth Pick, PT with [facility] requesting verbal orders for continuation HHPT for strengthening, mobility,and pt education 1 wk 1, 2 wk 4, 1 wk 3.  Please call [phone] to advise.

## 2019-11-03 ENCOUNTER — Telehealth: Payer: Self-pay | Admitting: Orthopaedic Surgery

## 2019-11-03 ENCOUNTER — Other Ambulatory Visit: Payer: Self-pay | Admitting: Physician Assistant

## 2019-11-03 MED ORDER — HYDROCODONE-ACETAMINOPHEN 5-325 MG PO TABS: 1.0000 | ORAL_TABLET | Freq: Three times a day (TID) | ORAL | 0 refills | Status: DC | PRN

## 2019-11-03 NOTE — Telephone Encounter (Signed)
Just sent in

## 2019-11-03 NOTE — Telephone Encounter (Signed)
Please advise 

## 2019-11-03 NOTE — Telephone Encounter (Signed)
Pt called in requesting a refill on Hydrocodone, please have that sent to [facility] on [address].    [phone]

## 2019-11-06 ENCOUNTER — Other Ambulatory Visit: Payer: Self-pay | Admitting: Orthopaedic Surgery

## 2019-11-06 MED ORDER — TRAMADOL HCL 50 MG PO TABS: 50.0000 mg | ORAL_TABLET | Freq: Three times a day (TID) | ORAL | 2 refills | Status: DC | PRN

## 2019-11-06 MED ORDER — HYDROCODONE-ACETAMINOPHEN 5-325 MG PO TABS: 1.0000 | ORAL_TABLET | ORAL | 0 refills | Status: DC | PRN

## 2019-11-09 ENCOUNTER — Ambulatory Visit (INDEPENDENT_AMBULATORY_CARE_PROVIDER_SITE_OTHER): Payer: Medicare Other | Admitting: Orthopaedic Surgery

## 2019-11-09 ENCOUNTER — Ambulatory Visit (INDEPENDENT_AMBULATORY_CARE_PROVIDER_SITE_OTHER): Payer: Medicare Other

## 2019-11-09 ENCOUNTER — Other Ambulatory Visit: Payer: Self-pay

## 2019-11-09 ENCOUNTER — Encounter: Payer: Self-pay | Admitting: Physician Assistant

## 2019-11-09 DIAGNOSIS — M84452A Pathological fracture, left femur, initial encounter for fracture: Secondary | ICD-10-CM

## 2019-11-09 NOTE — Progress Notes (Signed)
Post-Op Visit Note   Patient: Austin Oconnor           Date of Birth: [birth year]           MRN: [id number] Visit Date: 11/09/2019 PCP: [name], MD   Assessment & Plan:  Chief Complaint:  Chief Complaint  Patient presents with  . Left Hip - Pain, Routine Post Op   Visit Diagnoses:  1. Pathologic intertrochanteric fracture, left, initial encounter [facility])     Plan: Patient is a 78 year old gentleman who comes in today 2 weeks status post left hip intramedullary nail from a pathologic intertrochanteric fracture, date of surgery 10/21/2019.  He has been doing okay.  He has been taking oxycodone and Tylenol for his pain.  No fevers or chills.  He has been getting home health physical therapy where he is slowly but steadily progressing.  He is requiring significant help with ADLs and self-care due to left lower extremity weakness and pain.  He is ambulating with a walker.  Examination of his left hip reveals well-healing surgical incisions with staples intact.  No evidence of infection or cellulitis.  He does have moderate swelling to the left lower extremity.  Calf is soft and nontender.  Negative Homans.  He is neurovascular intact distally.  Today, staples were removed and Steri-Strips applied.  I have encouraged the patient to start wearing his TED hose.  He will continue with home health physical therapy.  We will put in a consult for occupational therapy.  We will follow up with [place] in 4 weeks time for repeat evaluation and 2 view x-rays of the left femur.  Call with concerns or questions in the meantime  Follow-Up Instructions: Return in about 4 weeks (around 12/07/2019).   Orders:  Orders Placed This Encounter  Procedures  . XR FEMUR MIN 2 VIEWS LEFT   No orders of the defined types were placed in this encounter.   Imaging: XR FEMUR MIN 2 VIEWS LEFT  Result Date: 11/09/2019 X-rays demonstrate stable alignment of the fracture and hardware without interval  change   PMFS History: Patient Active Problem List   Diagnosis Date Noted  . Pancytopenia ([place]) 10/21/2019  . Cardiomegaly 10/21/2019  . Pathologic intertrochanteric fracture, left, initial encounter ([place]) 10/20/2019  . Essential hypertension 10/20/2019  . Goals of care, counseling/discussion 08/16/2019  . Malignant neoplasm of prostate ([place]) 02/05/2016  . Bone metastasis ([place]) 02/05/2016   Past Medical History:  Diagnosis Date  . At risk for sleep apnea    STOP-BANG= 4      . Elevated PSA   . Hypertension   . Nocturia   . Prostate cancer ([place])   . Wears partial dentures     Family History  Problem Relation Age of Onset  . Prostate cancer Brother   . Cancer Neg Hx   . Breast cancer Neg Hx   . Colon cancer Neg Hx   . Pancreatic cancer Neg Hx     Past Surgical History:  Procedure Laterality Date  . INGUINAL HERNIA REPAIR Bilateral 1990  &  1960s  . INTRAMEDULLARY (IM) NAIL INTERTROCHANTERIC Left 10/21/2019   Procedure: LEFT INTERTROCHANTERIC INTRAMEDULLARY (IM) NAIL;  Surgeon: Leandrew Koyanagi, MD;  Location: [facility];  Service: Orthopedics;  Laterality: Left;  . LUMBAR SPINE SURGERY  age 65's  . PROSTATE BIOPSY N/A 09/16/2014   Procedure: BIOPSY TRANSRECTAL ULTRASONIC PROSTATE (TUBP);  Surgeon: Arvil Persons, MD;  Location: [facility];  Service: Urology;  Laterality:  N/A;   Social History   Occupational History  . Not on file  Tobacco Use  . Smoking status: Never Smoker  . Smokeless tobacco: Never Used  Substance and Sexual Activity  . Alcohol use: No  . Drug use: No  . Sexual activity: Not Currently

## 2019-11-10 ENCOUNTER — Inpatient Hospital Stay: Payer: Medicare Other

## 2019-11-10 ENCOUNTER — Inpatient Hospital Stay: Payer: Medicare Other | Admitting: Oncology

## 2019-11-10 ENCOUNTER — Telehealth: Payer: Self-pay | Admitting: Oncology

## 2019-11-10 NOTE — Telephone Encounter (Signed)
R/s appt per 12/30 sch message - pt aware of apt date and time

## 2019-11-12 ENCOUNTER — Other Ambulatory Visit: Payer: Self-pay | Admitting: Orthopaedic Surgery

## 2019-11-12 MED ORDER — OXYCODONE HCL 5 MG PO TABS: 5.0000 mg | ORAL_TABLET | ORAL | 0 refills | Status: DC | PRN

## 2019-11-12 MED ORDER — ONDANSETRON 4 MG PO TBDP: 4.0000 mg | ORAL_TABLET | Freq: Three times a day (TID) | ORAL | 0 refills | Status: DC | PRN

## 2019-11-14 ENCOUNTER — Other Ambulatory Visit: Payer: Self-pay | Admitting: Orthopaedic Surgery

## 2019-11-14 MED ORDER — HYDROCODONE-ACETAMINOPHEN 5-325 MG PO TABS: 1.0000 | ORAL_TABLET | Freq: Four times a day (QID) | ORAL | 0 refills | Status: DC | PRN

## 2019-11-15 ENCOUNTER — Telehealth: Payer: Self-pay

## 2019-11-15 ENCOUNTER — Telehealth: Payer: Self-pay | Admitting: Orthopaedic Surgery

## 2019-11-15 NOTE — Telephone Encounter (Signed)
IC verbal given.  

## 2019-11-15 NOTE — Telephone Encounter (Signed)
Received message from patient daughter Austin Oconnor stating that the patient needs medication refills. Contacted patient and he stated that they contacted the orthopedist for the pain medication refills and have no other questions or concerns.

## 2019-11-15 NOTE — Telephone Encounter (Signed)
Received call from Costella Hatcher -(OT) with [facility] needing verbal orders for HHOT 2 Wk 3 and 1 Wk 1   The number to contact jim is [phone]

## 2019-11-16 ENCOUNTER — Other Ambulatory Visit: Payer: Self-pay

## 2019-11-16 ENCOUNTER — Inpatient Hospital Stay: Payer: Medicare Other | Admitting: Orthopaedic Surgery

## 2019-11-16 ENCOUNTER — Inpatient Hospital Stay: Payer: Medicare Other | Attending: Oncology

## 2019-11-16 ENCOUNTER — Inpatient Hospital Stay (HOSPITAL_BASED_OUTPATIENT_CLINIC_OR_DEPARTMENT_OTHER): Payer: Medicare Other | Admitting: Oncology

## 2019-11-16 VITALS — BP 143/89 | HR 97 | Temp 98.4°F | Resp 18 | Ht 68.0 in | Wt 149.7 lb

## 2019-11-16 DIAGNOSIS — G893 Neoplasm related pain (acute) (chronic): Secondary | ICD-10-CM | POA: Diagnosis not present

## 2019-11-16 DIAGNOSIS — C787 Secondary malignant neoplasm of liver and intrahepatic bile duct: Secondary | ICD-10-CM | POA: Diagnosis not present

## 2019-11-16 DIAGNOSIS — C7951 Secondary malignant neoplasm of bone: Secondary | ICD-10-CM | POA: Diagnosis not present

## 2019-11-16 DIAGNOSIS — Z923 Personal history of irradiation: Secondary | ICD-10-CM | POA: Diagnosis not present

## 2019-11-16 DIAGNOSIS — M898X9 Other specified disorders of bone, unspecified site: Secondary | ICD-10-CM | POA: Diagnosis not present

## 2019-11-16 DIAGNOSIS — C61 Malignant neoplasm of prostate: Secondary | ICD-10-CM

## 2019-11-16 DIAGNOSIS — Z79899 Other long term (current) drug therapy: Secondary | ICD-10-CM | POA: Insufficient documentation

## 2019-11-16 DIAGNOSIS — D649 Anemia, unspecified: Secondary | ICD-10-CM

## 2019-11-16 DIAGNOSIS — D696 Thrombocytopenia, unspecified: Secondary | ICD-10-CM | POA: Diagnosis not present

## 2019-11-16 DIAGNOSIS — D630 Anemia in neoplastic disease: Secondary | ICD-10-CM | POA: Diagnosis not present

## 2019-11-16 LAB — CBC WITH DIFFERENTIAL (CANCER CENTER ONLY)
Abs Immature Granulocytes: 0.22 10*3/uL — ABNORMAL HIGH (ref 0.00–0.07)
Basophils Absolute: 0.1 10*3/uL (ref 0.0–0.1)
Basophils Relative: 1 %
Eosinophils Absolute: 0 10*3/uL (ref 0.0–0.5)
Eosinophils Relative: 0 %
HCT: 20.8 % — ABNORMAL LOW (ref 39.0–52.0)
Hemoglobin: 6.5 g/dL — CL (ref 13.0–17.0)
Immature Granulocytes: 5 %
Lymphocytes Relative: 23 %
Lymphs Abs: 1 10*3/uL (ref 0.7–4.0)
MCH: 29.8 pg (ref 26.0–34.0)
MCHC: 31.3 g/dL (ref 30.0–36.0)
MCV: 95.4 fL (ref 80.0–100.0)
Monocytes Absolute: 0.8 10*3/uL (ref 0.1–1.0)
Monocytes Relative: 18 %
Neutro Abs: 2.4 10*3/uL (ref 1.7–7.7)
Neutrophils Relative %: 53 %
Platelet Count: 67 10*3/uL — ABNORMAL LOW (ref 150–400)
RBC: 2.18 MIL/uL — ABNORMAL LOW (ref 4.22–5.81)
RDW: 16.7 % — ABNORMAL HIGH (ref 11.5–15.5)
WBC Count: 4.5 10*3/uL (ref 4.0–10.5)
nRBC: 12.8 % — ABNORMAL HIGH (ref 0.0–0.2)

## 2019-11-16 LAB — CMP (CANCER CENTER ONLY)
ALT: 18 U/L (ref 0–44)
AST: 47 U/L — ABNORMAL HIGH (ref 15–41)
Albumin: 3 g/dL — ABNORMAL LOW (ref 3.5–5.0)
Alkaline Phosphatase: 528 U/L — ABNORMAL HIGH (ref 38–126)
Anion gap: 11 (ref 5–15)
BUN: 14 mg/dL (ref 8–23)
CO2: 27 mmol/L (ref 22–32)
Calcium: 7.9 mg/dL — ABNORMAL LOW (ref 8.9–10.3)
Chloride: 104 mmol/L (ref 98–111)
Creatinine: 0.76 mg/dL (ref 0.61–1.24)
GFR, Est AFR Am: >60 mL/min (ref >60)
GFR, Estimated: >60 mL/min (ref >60)
Glucose, Bld: 95 mg/dL (ref 70–99)
Potassium: 4 mmol/L (ref 3.5–5.1)
Sodium: 142 mmol/L (ref 135–145)
Total Bilirubin: 0.7 mg/dL (ref 0.3–1.2)
Total Protein: 6 g/dL — ABNORMAL LOW (ref 6.5–8.1)

## 2019-11-16 NOTE — Progress Notes (Signed)
Hematology and Oncology Follow Up Visit  Austin Oconnor [id number] 29-Jul-1941 79 y.o. 11/16/2019 3:50 PM Austin Oconnor, MDVaradarajan, Austin,*   Principle Diagnosis: 79 year old man with advanced prostate cancer with documented disease to the bone and liver since 2017.  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    Prior Therapy:  He is status post radiation therapy to the area under the care of Dr. Tammi Klippel between 02/19/2016 and 03/01/2016.   He was treated with Lupron under the care of Dr. Luberta Robertson every 4 months.   His PSA was up to 0.49 in November 2017 and in February 2018 was 2.07 his testosterone was adequately castrate at that time.  Indicating castration resistant disease.   [name] 1000 mg daily with prednisone 5 mg daily started in March 2018.  Therapy discontinued in February 2020 due to progression of disease.  He was started on Xtandi which was discontinued after poor tolerance in March 2020.   Trudi Ida on a monthly basis started in May 2020.  Therapy discontinued after April 13, 2019 because of progression of disease.  Status post radiation therapy to the proximal right femur and left sacrum completed in September 2020.   Current therapy: Supportive management only.   Interim History: Austin Oconnor returns today for repeat evaluation.  Since last visit, he sustained a left intertrochanteric pathological fracture in December 2020.  He underwent intramedullary placement and fixation completed on October 21, 2019 under the care of Dr. Erlinda Hong.  He is recovering reasonably well at this time with improvement in his mobility the help of physical therapy.  He is still overall fatigue and although feels improvement in his overall appetite.  Quality of life is marginal but not dramatically different.                      Medications: Updated on review today. Current Outpatient Medications  Medication Sig Dispense Refill  . alendronate (FOSAMAX) 70 MG tablet  Take 70 mg by mouth once a week.    Marland Kitchen aspirin (BAYER ASPIRIN) 325 MG tablet Take 1 tablet (325 mg total) by mouth daily. 42 tablet 0  . calcium-vitamin D (OSCAL WITH D) 500-200 MG-UNIT tablet Take 1 tablet by mouth 3 (three) times daily. 90 tablet 6  . cephALEXin (KEFLEX) 500 MG capsule Take 1 capsule (500 mg total) by mouth 4 (four) times daily. 56 capsule 0  . Cholecalciferol (VITAMIN D3) 1000 UNITS CAPS Take 1 capsule by mouth daily.    . fentaNYL (DURAGESIC) 25 MCG/HR Place 1 patch onto the skin every 3 (three) days. 10 patch 0  . HYDROcodone-acetaminophen (NORCO) 5-325 MG tablet Take 1-2 tablets by mouth every 6 (six) hours as needed for moderate pain. 30 tablet 0  . lisinopril (PRINIVIL,ZESTRIL) 10 MG tablet Take 1 tablet by mouth every evening.     . methocarbamol (ROBAXIN) 750 MG tablet Take 1 tablet (750 mg total) by mouth 2 (two) times daily as needed for muscle spasms. 60 tablet 0  . ondansetron (ZOFRAN ODT) 4 MG disintegrating tablet Take 1 tablet (4 mg total) by mouth every 8 (eight) hours as needed for nausea or vomiting. 20 tablet 0  . oxyCODONE (OXY IR/ROXICODONE) 5 MG immediate release tablet Take 1 tablet (5 mg total) by mouth every 4 (four) hours as needed for severe pain. 60 tablet 0  . oxyCODONE-acetaminophen (PERCOCET) 5-325 MG tablet Take 1-2 tablets by mouth every 8 (eight) hours as needed for severe pain. 30 tablet 0  . traMADol (ULTRAM)  50 MG tablet Take 1-2 tablets (50-100 mg total) by mouth 3 (three) times daily as needed. 30 tablet 2  . vitamin B-12 (CYANOCOBALAMIN) 1000 MCG tablet Take 1,000 mcg by mouth daily.    Marland Kitchen zinc sulfate 220 (50 Zn) MG capsule Take 1 capsule (220 mg total) by mouth daily. 42 capsule 0   No current facility-administered medications for this visit.   Facility-Administered Medications Ordered in Other Visits  Medication Dose Route Frequency Provider Last Rate Last Admin  . sodium chloride flush (NS) 0.9 % injection 3 mL  3 mL Intracatheter PRN  Wyatt Portela, MD         Allergies: No Known Allergies     Physical Exam:     Blood pressure (!) 143/89, pulse 97, temperature 98.4 F (36.9 C), temperature source Temporal, resp. rate 18, height 5\' 8"  (1.727 m), weight 149 lb 11.2 oz (67.9 kg), SpO2 99 %.      ECOG: 1    General appearance: Comfortable appearing without any discomfort Head: Normocephalic without any trauma Oropharynx: Mucous membranes are moist and pink without any thrush or ulcers. Eyes: Pupils are equal and round reactive to light. Lymph nodes: No cervical, supraclavicular, inguinal or axillary lymphadenopathy.   Heart:regular rate and rhythm.  S1 and S2 without leg edema. Lung: Clear without any rhonchi or wheezes.  No dullness to percussion. Abdomin: Soft, nontender, nondistended with good bowel sounds.  No hepatosplenomegaly. Musculoskeletal: No joint deformity or effusion.  Full range of motion noted. Neurological: No deficits noted on motor, sensory and deep tendon reflex exam. Skin: No petechial rash or dryness.  Appeared moist.          Lab Results: Lab Results  Component Value Date   WBC 4.5 11/16/2019   HGB 6.5 (LL) 11/16/2019   HCT 20.8 (L) 11/16/2019   MCV 95.4 11/16/2019   PLT 67 (L) 11/16/2019     Chemistry      Component Value Date/Time   NA 135 10/23/2019 0624   NA 143 09/16/2017 0805   K 4.2 10/23/2019 0624   K 3.4 (L) 09/16/2017 0805   CL 101 10/23/2019 0624   CO2 24 10/23/2019 0624   CO2 29 09/16/2017 0805   BUN 24 (H) 10/23/2019 0624   BUN 9.1 09/16/2017 0805   CREATININE 1.00 10/23/2019 0624   CREATININE 0.83 10/14/2019 1034   CREATININE 0.8 09/16/2017 0805      Component Value Date/Time   CALCIUM 7.8 (L) 10/23/2019 0624   CALCIUM 8.4 09/16/2017 0805   ALKPHOS 467 (H) 10/21/2019 0700   ALKPHOS 53 09/16/2017 0805   AST 60 (H) 10/21/2019 0700   AST 49 (H) 10/14/2019 1034   AST 26 09/16/2017 0805   ALT 30 10/21/2019 0700   ALT 25 10/14/2019 1034    ALT 19 09/16/2017 0805   BILITOT 1.0 10/21/2019 0700   BILITOT 0.4 10/14/2019 1034   BILITOT 0.64 09/16/2017 0805             Impression and Plan:  79 year old man with:  1.  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  His disease status was reviewed today and his family that accompanied him.  He understands he has an incurable malignancy but disease approaching end-stage at this time.  Treatment options were reviewed and he is a poor candidate for systemic chemotherapy given his severe cytopenia as well as debilitation.  At this time I recommended continued supportive  care with consideration of hospice.  We have discussed hospice services at this time in detail and he will let me know about that option in the future.  2.  Goals of care and prognosis: He is approaching end-stage status although his performance status is reasonable is declining fast at this time.  3.  Anemia: Due to malignancy and chronic disease.  His hemoglobin is declining and would benefit from to receive packed red cells.  4.  Bone pain: Manageable at this time with the current pain medication.  He has been prescribed fentanyl patch but has not been using it regularly.  He has been using Percocet as needed.  5.  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  6. Follow-up: He will return in 2 months for repeat evaluation.  25  minutes was spent on this encounter.  The time was dedicated to reviewing patient's disease status, reviewing laboratory data as well as addressing complications related to his cancer.     Zola Button, MD 1/5/20213:50 PM

## 2019-11-16 NOTE — Progress Notes (Signed)
Lab called with Hgb 6.5. Orders placed for 2 units PRBC to be infused on 11/19/19. Patient reminded to keep blue bracelet on for appointment on 11/19/19. Scheduling message sent.

## 2019-11-17 ENCOUNTER — Telehealth: Payer: Self-pay | Admitting: Oncology

## 2019-11-17 ENCOUNTER — Other Ambulatory Visit: Payer: Self-pay

## 2019-11-17 ENCOUNTER — Ambulatory Visit
Admission: RE | Admit: 2019-11-17 | Discharge: 2019-11-17 | Disposition: A | Discharge status: Home / Self Care | Payer: Medicare Other | Source: Ambulatory Visit | Attending: Urology | Admitting: Urology

## 2019-11-17 ENCOUNTER — Encounter: Payer: Self-pay | Admitting: Urology

## 2019-11-17 DIAGNOSIS — C7951 Secondary malignant neoplasm of bone: Secondary | ICD-10-CM

## 2019-11-17 LAB — PREPARE RBC (CROSSMATCH)

## 2019-11-17 LAB — SAMPLE TO BLOOD BANK

## 2019-11-17 LAB — PROSTATE-SPECIFIC AG, SERUM (LABCORP): Prostate Specific Ag, Serum: 583 ng/mL — ABNORMAL HIGH (ref 0.0–4.0)

## 2019-11-17 NOTE — Telephone Encounter (Signed)
Scheduled appt per 1/5 los, sent a message to HIM pool to get a calendar mailed out. 

## 2019-11-17 NOTE — Progress Notes (Addendum)
Radiation Oncology         [phone]) [phone] ________________________________  Outpatient Re-consult visit  Name: Austin Oconnor MRN: [id number]  Date: 11/17/2019  DOB: 1940/11/25  CV:[id number], Ronie Spies, MD  Franchot Gallo, MD   REFERRING PHYSICIAN: Dr. Frankey Shown  DIAGNOSIS: 79 y.o. 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     ICD-10-CM   1. Bone metastasis (HCC)  C79.51     HISTORY OF PRESENT ILLNESS: Austin Oconnor is a 79 y.o. 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 Most recently, he completed a course of palliative radiotherapy to the right hip and left sacrum for pain control and prevention of possible hip fracture due to extensive nature of disease in the IT region of the right femur.  His radiation was completed in 08/2019 and he did report improvement in the right groin/hip pain but continued with significant pain and weakness in the left leg requiring increased pain medications. He had decided that he did not want to pursue any further systemic treatment for his prostate cancer and instead focus more on comfort care. He developed a pathologic intertrochanteric fracture of the left hip on 10/18/19 and proceeded with ORIF with intermedullary implant under the care and direction of Dr. Erlinda Hong on 10/21/19.  He has continued working with PT in the home and feels that he is making good progress, now ambulating independently around his home with the support of a rolling walker. He met back with Dr. Alen Blew on 11/16/19 and given his continued decline in performance status, he is still most interested in observation only with focus on palliative care/comfort measures.  We were asked to weigh in with recommendations for postoperative, palliative radiotherapy to the left hip for improved hardware stabilization and pain  control.  In summary, he was initially diagnosed with Gleason 4+5 metastatic adenocarcinoma of the prostate with PSA of 15.01 in September 2015 with Dr. Lowella Bandy. At the time of diagnosis, a bone scan on 09/30/2014 revealed evidence of osseous metastatic disease in the intertrochanteric region of the left femur and an MRI of the pelvis on 10/24/2014 confirmed metastatic involvement in the left intertrochanteric femur and multiple other metastases within the pelvis. The patient started ADT on 11/28/2014 with excellent response and PSA nadir down to 0.13 in July 2017. Delton See was started on 06/06/2015. 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 The patient initially presented to our clinic in March 2017 for consideration of palliative radiation treatment to the left hip to reduce fracture risk and this was completed in April 2017 and was well tolerated.  [name] was added to his ADT treatment in 01/2017 due to rising PSA, up to 2.07 in February 2018, despite castrate levels of testoterone. His PSA dropped to 0.3 in April 2018 and then remained <0.1 until May 2019, when it jumped back up to 0.5. Unfortunately, the PSA continued to rise and reached 75.6 in February 2020. A restaging CT abdomen/pelvis did not show any evidence of visceral metastases. However, bone scan on 12/15/2018 confirmed widespread, osseous metastatic disease, markedly progressive since prior stable exams. Metastatic sites included the sternum, bilateral scapula, bilateral anterior and posterior ribs, thoracic and lumbar spine, pelvis, bilateral proximal femora, and suspected involvement in the left humerus. Fortunately, he was not having any focal bony pain associated with the diffuse bony  metastases at that time. At that point, his Fabio Asa was discontinued due to progression of disease and he was switched to [facility], but this was also discontinued after one treatment due to poor  tolerance.   He was started on Xofigo treatments in 02/2019 to help better manage/control his progressive disease. Unfortunately, this was discontinued following his third injection due to persistent decline in his hemoglobin levels. He developed worsening pain in the left buttock with radiation into the LLE to the level of his knee as well as pain in the right hip/groin.  He saw Dr. Diona Fanti in follow up on 07/21/2019, and was started on Alendronate in place of Xgeva due to the patient's worsening leg pain. He has also continued on ADT with his most recent injection of Trelstar (6 month) given 04/16/2019. Dr. Shirley Muscat ordered repeat disease staging studies to include CT A/P and bone scan. These studies were performed on 08/03/2019 with the bone scan demonstrating marked progression of widespread osseous metastatic disease as compared to his prior exam in 12/2018 with significant increased activity most notable in the intertrochanteric femur on the right as well as increased activity in the sacrum.  The CT/P unfortunately demonstrated new metastatic disease involving the liver, with the largest lesion measuring 2.4 cm and multiple pulmonary nodules in addition to the progression of disease in the skeleton.   Given his disease progression and pain, he was referred for palliative radiation to the right hip and sacrum which was completed in 08/2019 and tolerated well.  The right groin pain improved but he continued with significant pain in the left leg. Unfortunately, his PSA has continued to rise to 441 on 08/27/2019 and most recently up to 522 on 10/14/2019 but patient has met with Dr. Alen Blew to discuss further systemic treatment options and has elected to continue in observation only with a focus on comfort measures.     PREVIOUS RADIATION THERAPY: Yes  08/12/2019 - 08/25/2019:Palliative XRT 1. Right Hip / 30 Gy in 10 fractions 2. Left Sacrum / 30 Gy in 10 fractions   02/19/2016 to 03/01/2016:  The Left femur  met was treated to 30 Gy in 10 fractions at 3 Gy per fraction.  PAST MEDICAL HISTORY:  Past Medical History:  Diagnosis Date  . At risk for sleep apnea    STOP-BANG= 4      . Elevated PSA   . Hypertension   . Nocturia   . Prostate cancer ([place])   . Wears partial dentures       PAST SURGICAL HISTORY: Past Surgical History:  Procedure Laterality Date  . INGUINAL HERNIA REPAIR Bilateral 1990  &  1960s  . INTRAMEDULLARY (IM) NAIL INTERTROCHANTERIC Left 10/21/2019   Procedure: LEFT INTERTROCHANTERIC INTRAMEDULLARY (IM) NAIL;  Surgeon: Leandrew Koyanagi, MD;  Location: [place];  Service: Orthopedics;  Laterality: Left;  . LUMBAR SPINE SURGERY  age 55's  . PROSTATE BIOPSY N/A 09/16/2014   Procedure: BIOPSY TRANSRECTAL ULTRASONIC PROSTATE (TUBP);  Surgeon: Arvil Persons, MD;  Location: [facility];  Service: Urology;  Laterality: N/A;    FAMILY HISTORY:  Family History  Problem Relation Age of Onset  . Prostate cancer Brother   . Cancer Neg Hx   . Breast cancer Neg Hx   . Colon cancer Neg Hx   . Pancreatic cancer Neg Hx     SOCIAL HISTORY:  Social History   Socioeconomic History  . Marital status: Widowed    Spouse name: Not on file  .  Number of children: 3  . Years of education: Not on file  . Highest education level: Not on file  Occupational History  . Not on file  Tobacco Use  . Smoking status: Never Smoker  . Smokeless tobacco: Never Used  Substance and Sexual Activity  . Alcohol use: No  . Drug use: No  . Sexual activity: Not Currently  Other Topics Concern  . Not on file  Social History Narrative   3 children but one passed   Social Determinants of Health   Financial Resource Strain:   . Difficulty of Paying Living Expenses: Not on file  Food Insecurity:   . Worried About Charity fundraiser in the Last Year: Not on file  . Ran Out of Food in the Last Year: Not on file  Transportation Needs:   . Lack of Transportation (Medical): Not on file  .  Lack of Transportation (Non-Medical): Not on file  Physical Activity:   . Days of Exercise per Week: Not on file  . Minutes of Exercise per Session: Not on file  Stress:   . Feeling of Stress : Not on file  Social Connections:   . Frequency of Communication with Friends and Family: Not on file  . Frequency of Social Gatherings with Friends and Family: Not on file  . Attends Religious Services: Not on file  . Active Member of Clubs or Organizations: Not on file  . Attends Archivist Meetings: Not on file  . Marital Status: Not on file  Intimate Partner Violence:   . Fear of Current or Ex-Partner: Not on file  . Emotionally Abused: Not on file  . Physically Abused: Not on file  . Sexually Abused: Not on file    ALLERGIES: Patient has no known allergies.  MEDICATIONS:  Current Outpatient Medications  Medication Sig Dispense Refill  . alendronate (FOSAMAX) 70 MG tablet Take 70 mg by mouth once a week.    Marland Kitchen aspirin (BAYER ASPIRIN) 325 MG tablet Take 1 tablet (325 mg total) by mouth daily. 42 tablet 0  . calcium-vitamin D (OSCAL WITH D) 500-200 MG-UNIT tablet Take 1 tablet by mouth 3 (three) times daily. 90 tablet 6  . cephALEXin (KEFLEX) 500 MG capsule Take 1 capsule (500 mg total) by mouth 4 (four) times daily. 56 capsule 0  . Cholecalciferol (VITAMIN D3) 1000 UNITS CAPS Take 1 capsule by mouth daily.    Marland Kitchen HYDROcodone-acetaminophen (NORCO) 5-325 MG tablet Take 1-2 tablets by mouth every 6 (six) hours as needed for moderate pain. 30 tablet 0  . lisinopril (PRINIVIL,ZESTRIL) 10 MG tablet Take 1 tablet by mouth every evening.     . methocarbamol (ROBAXIN) 750 MG tablet Take 1 tablet (750 mg total) by mouth 2 (two) times daily as needed for muscle spasms. 60 tablet 0  . oxyCODONE (OXY IR/ROXICODONE) 5 MG immediate release tablet Take 1 tablet (5 mg total) by mouth every 4 (four) hours as needed for severe pain. 60 tablet 0  . oxyCODONE-acetaminophen (PERCOCET) 5-325 MG tablet Take  1-2 tablets by mouth every 8 (eight) hours as needed for severe pain. 30 tablet 0  . traMADol (ULTRAM) 50 MG tablet Take 1-2 tablets (50-100 mg total) by mouth 3 (three) times daily as needed. 30 tablet 2  . vitamin B-12 (CYANOCOBALAMIN) 1000 MCG tablet Take 1,000 mcg by mouth daily.    Marland Kitchen zinc sulfate 220 (50 Zn) MG capsule Take 1 capsule (220 mg total) by mouth daily. 42 capsule 0  .  fentaNYL (DURAGESIC) 25 MCG/HR Place 1 patch onto the skin every 3 (three) days. (Patient not taking: Reported on 11/17/2019) 10 patch 0  . ondansetron (ZOFRAN ODT) 4 MG disintegrating tablet Take 1 tablet (4 mg total) by mouth every 8 (eight) hours as needed for nausea or vomiting. (Patient not taking: Reported on 11/17/2019) 20 tablet 0   No current facility-administered medications for this encounter.   Facility-Administered Medications Ordered in Other Encounters  Medication Dose Route Frequency Provider Last Rate Last Admin  . sodium chloride flush (NS) 0.9 % injection 3 mL  3 mL Intracatheter PRN Alen Blew, Mathis Dad, MD        REVIEW OF SYSTEMS:  On review of systems, the patient reports that he is doing well overall. He denies any chest pain, shortness of breath, cough, fevers, chills, night sweats, or unintended weight changes. He continues with decreased appetite but denies any bowel or bladder disturbances, and denies abdominal pain, nausea or vomiting. He is forcing himself to eat 3 meals a day to maintain his weight. He reports that the left leg/groin pain is improved since his surgery and he feels that he is making good progress with PT.  Marland Kitchen  He denies paraesthesias or focal weakness in the lower extremities.  A complete review of systems is obtained and is otherwise negative.  PHYSICAL EXAM:  Unable to assess due to telephone reconsult visit format.  Wt Readings from Last 3 Encounters:  11/16/19 149 lb 11.2 oz (67.9 kg)  10/21/19 152 lb (68.9 kg)  10/18/19 152 lb (68.9 kg)   Temp Readings from Last 3  Encounters:  11/16/19 98.4 F (36.9 C) (Temporal)  10/27/19 99 F (37.2 C) (Oral)  10/18/19 98.5 F (36.9 C) (Oral)   BP Readings from Last 3 Encounters:  11/16/19 (!) 143/89  10/27/19 123/62  10/18/19 (!) 151/81   Pulse Readings from Last 3 Encounters:  11/16/19 97  10/27/19 94  10/18/19 92    /10   LABORATORY DATA:  Lab Results  Component Value Date   WBC 4.5 11/16/2019   HGB 6.5 (LL) 11/16/2019   HCT 20.8 (L) 11/16/2019   MCV 95.4 11/16/2019   PLT 67 (L) 11/16/2019   Lab Results  Component Value Date   NA 142 11/16/2019   K 4.0 11/16/2019   CL 104 11/16/2019   CO2 27 11/16/2019   Lab Results  Component Value Date   ALT 18 11/16/2019   AST 47 (H) 11/16/2019   ALKPHOS 528 (H) 11/16/2019   BILITOT 0.7 11/16/2019     RADIOGRAPHY: DG Chest Port 1 View  Result Date: 10/20/2019 CLINICAL DATA:  Preop for left hip fracture. Pain. EXAM: PORTABLE CHEST 1 VIEW COMPARISON:  12/19/2018 FINDINGS: The heart size is moderately enlarged. There is no pneumothorax. No large pleural effusion. No large focal infiltrate. There are advanced degenerative changes of the right glenohumeral joint. IMPRESSION: Cardiomegaly without edema or large pleural effusion. Electronically Signed   By: Constance Holster M.D.   On: 10/20/2019 21:50   DG C-Arm 1-60 Min  Result Date: 10/21/2019 CLINICAL DATA:  Status post surgical internal fixation of left femur fracture. EXAM: LEFT FEMUR 2 VIEWS; DG C-ARM 1-60 MIN Radiation exposure index: 15.023 mGy. COMPARISON:  October 18, 2019. FINDINGS: Five intraoperative fluoroscopic images were obtained of the left femur. These images demonstrate surgical internal fixation of left femoral neck fracture as well as intramedullary rod fixation of the left femur. IMPRESSION: Fluoroscopic guidance provided during surgical internal fixation of proximal  left femoral fracture. Electronically Signed   By: Marijo Conception M.D.   On: 10/21/2019 14:44   ECHOCARDIOGRAM  COMPLETE  Result Date: 10/21/2019   ECHOCARDIOGRAM REPORT   Patient Name:   Austin Oconnor Date of Exam: 10/21/2019 Medical Rec #:  [id number]     Height:       68.0 in Accession #:    [id number]    Weight:       152.0 lb Date of Birth:  04/02/1941     BSA:          1.82 m Patient Age:    68 years      BP:           158/86 mmHg Patient Gender: M             HR:           77 bpm. Exam Location:  Inpatient Procedure: 2D Echo STAT ECHO Indications:    Cardiomegaly 429.3 / I51.7  History:        Patient has no prior history of Echocardiogram examinations.                 Risk Factors:Hypertension. Metastatic prostate cancer, left                 femoral neck fracture.  Sonographer:    Darlina Sicilian RDCS Referring Phys: [id number] [facility] M XU  Sonographer Comments: Echo for pre-op IMPRESSIONS  1. Left ventricular ejection fraction, by visual estimation, is 60 to 65%. The left ventricle has normal function. There is mildly increased left ventricular hypertrophy.  2. Left ventricular diastolic parameters are consistent with Grade I diastolic dysfunction (impaired relaxation).  3. The left ventricle has no regional wall motion abnormalities.  4. Global right ventricle has normal systolic function.The right ventricular size is normal.  5. Left atrial size was normal.  6. Right atrial size was normal.  7. The mitral valve is normal in structure. Trivial mitral valve regurgitation. No evidence of mitral stenosis.  8. The tricuspid valve is normal in structure. Tricuspid valve regurgitation is mild.  9. The aortic valve is tricuspid. Aortic valve regurgitation is trivial. No evidence of aortic valve sclerosis or stenosis. 10. The pulmonic valve was normal in structure. Pulmonic valve regurgitation is trivial. 11. Mildly elevated pulmonary artery systolic pressure. 12. The inferior vena cava is normal in size with greater than 50% respiratory variability, suggesting right atrial pressure of 3 mmHg. 13. Normal LV systolic  function; grade 1 diastolic dysfunction; mild LVH; trace AI and MR; mild TR; mildly elevated pulmonary pressure. FINDINGS  Left Ventricle: Left ventricular ejection fraction, by visual estimation, is 60 to 65%. The left ventricle has normal function. The left ventricle has no regional wall motion abnormalities. There is mildly increased left ventricular hypertrophy. Left ventricular diastolic parameters are consistent with Grade I diastolic dysfunction (impaired relaxation). Normal left atrial pressure. Right Ventricle: The right ventricular size is normal.Global RV systolic function is has normal systolic function. The tricuspid regurgitant velocity is 2.84 m/s, and with an assumed right atrial pressure of 3 mmHg, the estimated right ventricular systolic pressure is mildly elevated at 35.4 mmHg. Left Atrium: Left atrial size was normal in size. Right Atrium: Right atrial size was normal in size Pericardium: There is no evidence of pericardial effusion. Mitral Valve: The mitral valve is normal in structure. Trivial mitral valve regurgitation. No evidence of mitral valve stenosis by observation. Tricuspid Valve: The tricuspid valve is normal in  structure. Tricuspid valve regurgitation is mild. Aortic Valve: The aortic valve is tricuspid. Aortic valve regurgitation is trivial. The aortic valve is structurally normal, with no evidence of sclerosis or stenosis. Pulmonic Valve: The pulmonic valve was normal in structure. Pulmonic valve regurgitation is trivial. Pulmonic regurgitation is trivial. Aorta: The aortic root is normal in size and structure. Venous: The inferior vena cava is normal in size with greater than 50% respiratory variability, suggesting right atrial pressure of 3 mmHg.  Additional Comments: Normal LV systolic function; grade 1 diastolic dysfunction; mild LVH; trace AI and MR; mild TR; mildly elevated pulmonary pressure.  LEFT VENTRICLE PLAX 2D LVIDd:         4.65 cm  Diastology LVIDs:         3.31 cm   LV e' lateral:   7.18 cm/s LV PW:         1.17 cm  LV E/e' lateral: 9.5 LV IVS:        1.39 cm  LV e' medial:    5.11 cm/s LVOT diam:     2.20 cm  LV E/e' medial:  13.4 LV SV:         55 ml LV SV Index:   30.39 LVOT Area:     3.80 cm  RIGHT VENTRICLE RV S prime:     16.20 cm/s TAPSE (M-mode): 2.4 cm LEFT ATRIUM             Index       RIGHT ATRIUM           Index LA diam:        3.60 cm 1.98 cm/m  RA Area:     11.90 cm LA Vol (A2C):   60.9 ml 33.48 ml/m RA Volume:   20.00 ml  11.00 ml/m LA Vol (A4C):   51.1 ml 28.10 ml/m LA Biplane Vol: 59.1 ml 32.49 ml/m  AORTIC VALVE LVOT Vmax:   113.00 cm/s LVOT Vmean:  65.500 cm/s LVOT VTI:    0.221 m  AORTA Ao Root diam: 3.50 cm Ao Asc diam:  3.60 cm MITRAL VALVE                         TRICUSPID VALVE MV Area (PHT): 5.62 cm              TR Peak grad:   32.4 mmHg MV PHT:        39.15 msec            TR Vmax:        295.00 cm/s MV Decel Time: 135 msec MV E velocity: 68.40 cm/s  103 cm/s  SHUNTS MV A velocity: 119.00 cm/s 70.3 cm/s Systemic VTI:  0.22 m MV E/A ratio:  0.57        1.5       Systemic Diam: 2.20 cm  Kirk Ruths MD Electronically signed by Kirk Ruths MD Signature Date/Time: 10/21/2019/9:10:58 AM    Final    DG FEMUR MIN 2 VIEWS LEFT  Result Date: 10/21/2019 CLINICAL DATA:  Status post surgical internal fixation of left femur fracture. EXAM: LEFT FEMUR 2 VIEWS; DG C-ARM 1-60 MIN Radiation exposure index: 15.023 mGy. COMPARISON:  October 18, 2019. FINDINGS: Five intraoperative fluoroscopic images were obtained of the left femur. These images demonstrate surgical internal fixation of left femoral neck fracture as well as intramedullary rod fixation of the left femur. IMPRESSION: Fluoroscopic guidance provided during surgical internal fixation of proximal left  femoral fracture. Electronically Signed   By: Marijo Conception M.D.   On: 10/21/2019 14:44   XR FEMUR MIN 2 VIEWS LEFT  Result Date: 11/09/2019 X-rays demonstrate stable alignment of the  fracture and hardware without interval change     IMPRESSION/PLAN: This visit was conducted via telephone to spare the patient unnecessary potential exposure in the healthcare setting during the current COVID-19 pandemic.  1. 79 y.o. 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  Today, I talked to the patient about the findings and workup thus far and discussed the role of postoperative palliative radiotherapy to improve the stability of the new hardware and help with pain control.  We discussed the recommendation for a 2 week course of palliative radiotherapy to the left hip/proximal femur delivered in 10 daily daily treatments, similar to the treatments that he has had previously. He is familiar with radiation treatment due to his recent treatment to the right hip and prior treatment to the left hip in 2017 but we reviewed the details of logistics and delivery as well as the anticipated acute and late sequelae associated with radiation in this setting. The patient was encouraged to ask questions that were answered to his stated satisfaction.   At the end of our conversation, the patient would like to take some time to discuss this information with his daughter since she is his sole source for transportation.  I offered to connect him with our transportation coordinators here at the cancer center to assist but he is somewhat hesitant, uncertain that he would be able to get himself in and out of the house independently to participate in the transportation services. I advised that I would be happy to speak with his daughter if she has further questions regarding treatment and/or to schedule a follow up visit when she can participate in further discussion if they desire.  He knows that he is welcome to call at any time with further questions or concerns but that I will plan to follow up with him next week to see  if he has reached a decision, understanding that he may not be interested in further treatment at all and may transition to hospice care in the near future.  Given current concerns for patient exposure during the COVID-19 pandemic, this encounter was conducted via telephone. The patient was notified in advance and was offered a [place] meeting to allow for face to face communication but unfortunately reported that he did not have the appropriate resources/technology to support such a visit and instead preferred to proceed with telephone consult. The patient has given verbal consent for this type of encounter. The time spent during this encounter was 45 minutes. The attendants for this meeting include Eashan Schipani PA-C, and patient, Austin Oconnor. During the encounter, Gyneth Hubka PA-C, was located at [facility] Radiation Oncology Department.  Patient, Austin Oconnor, was located at home.   Nicholos Johns, PA-C    Tyler Pita, MD  [facility] Direct Dial: [phone]  Fax: [phone] [email or web address]  Skype  LinkedIn

## 2019-11-18 ENCOUNTER — Telehealth: Payer: Self-pay | Admitting: Oncology

## 2019-11-18 NOTE — Telephone Encounter (Signed)
Returned patient's phone call regarding rescheduling 01/08 appointment, left a voicemail.

## 2019-11-19 ENCOUNTER — Other Ambulatory Visit: Payer: Self-pay

## 2019-11-19 ENCOUNTER — Inpatient Hospital Stay: Payer: Medicare Other

## 2019-11-19 DIAGNOSIS — D649 Anemia, unspecified: Secondary | ICD-10-CM

## 2019-11-19 DIAGNOSIS — C61 Malignant neoplasm of prostate: Secondary | ICD-10-CM | POA: Diagnosis not present

## 2019-11-19 MED ORDER — SODIUM CHLORIDE 0.9% IV SOLUTION
250.0000 mL | Freq: Once | INTRAVENOUS | Status: AC
  Administered 2019-11-19: 250 mL via INTRAVENOUS
  Filled 2019-11-19: qty 250

## 2019-11-19 MED ORDER — ACETAMINOPHEN 325 MG PO TABS
650.0000 mg | ORAL_TABLET | Freq: Once | ORAL | Status: AC
  Administered 2019-11-19: 650 mg via ORAL

## 2019-11-19 MED ORDER — DIPHENHYDRAMINE HCL 25 MG PO CAPS
25.0000 mg | ORAL_CAPSULE | Freq: Once | ORAL | Status: AC
  Administered 2019-11-19: 25 mg via ORAL

## 2019-11-19 MED ORDER — ACETAMINOPHEN 325 MG PO TABS
ORAL_TABLET | ORAL | Status: AC
  Filled 2019-11-19: qty 2

## 2019-11-19 MED ORDER — DIPHENHYDRAMINE HCL 25 MG PO CAPS
ORAL_CAPSULE | ORAL | Status: AC
  Filled 2019-11-19: qty 1

## 2019-11-19 NOTE — Patient Instructions (Signed)

## 2019-11-20 ENCOUNTER — Other Ambulatory Visit: Payer: Self-pay | Admitting: Orthopedic Surgery

## 2019-11-20 LAB — BPAM RBC
Blood Product Expiration Date: 202101272359
Blood Product Expiration Date: 202101292359
ISSUE DATE / TIME: 202101081115
ISSUE DATE / TIME: 202101081115
Unit Type and Rh: 1700
Unit Type and Rh: 1700

## 2019-11-20 LAB — TYPE AND SCREEN
ABO/RH(D): B NEG
Antibody Screen: NEGATIVE
Unit division: 0
Unit division: 0

## 2019-11-20 MED ORDER — HYDROCODONE-ACETAMINOPHEN 5-325 MG PO TABS: 1.0000 | ORAL_TABLET | Freq: Four times a day (QID) | ORAL | 0 refills | Status: DC | PRN

## 2019-11-26 ENCOUNTER — Other Ambulatory Visit: Payer: Self-pay | Admitting: Physician Assistant

## 2019-11-26 ENCOUNTER — Telehealth: Payer: Self-pay | Admitting: Orthopaedic Surgery

## 2019-11-26 MED ORDER — OXYCODONE-ACETAMINOPHEN 5-325 MG PO TABS: 1.0000 | ORAL_TABLET | Freq: Three times a day (TID) | ORAL | 0 refills | Status: DC | PRN

## 2019-11-26 NOTE — Telephone Encounter (Signed)
Pt called in said that his pain is persistent and the hydrocodone isn't helping his pain and is requesting something stronger. Please have that sent to [facility] on [address].   [phone]

## 2019-11-26 NOTE — Telephone Encounter (Signed)
Called patient to let him know. No answer. LMOM. Rx called into pharm.

## 2019-11-26 NOTE — Telephone Encounter (Signed)
Sent in percocet

## 2019-11-29 ENCOUNTER — Telehealth: Payer: Self-pay | Admitting: Orthopaedic Surgery

## 2019-11-29 NOTE — Telephone Encounter (Signed)
Patient's daughter Deatra James called advised patient need Rx refilled (Oxycodone) The number to contact [facility] is [phone]

## 2019-11-30 ENCOUNTER — Other Ambulatory Visit: Payer: Self-pay | Admitting: Physician Assistant

## 2019-11-30 MED ORDER — OXYCODONE-ACETAMINOPHEN 5-325 MG PO TABS: 1.0000 | ORAL_TABLET | Freq: Every day | ORAL | 0 refills | Status: DC | PRN

## 2019-11-30 NOTE — Telephone Encounter (Signed)
We can write one more rx, and then he will need to continue getting this from pcp or cancer doctor.  Will you make him aware that this is  his las rx from [place].  thanks

## 2019-12-02 ENCOUNTER — Telehealth: Payer: Self-pay

## 2019-12-02 NOTE — Telephone Encounter (Signed)
Let's see him next Tuesday.

## 2019-12-02 NOTE — Telephone Encounter (Signed)
Patient daughter aware of the below message

## 2019-12-02 NOTE — Telephone Encounter (Signed)
Received a call from Austin Oconnor stating that patient had staples removed about 4 wks ago and now patient has a bump and would like to know should they wait to come into the office until there appointment on 12/15/2019.  CB#[phone].  Please advise. Thank you.

## 2019-12-02 NOTE — Telephone Encounter (Signed)
Appt made

## 2019-12-07 ENCOUNTER — Ambulatory Visit: Payer: Medicare Other | Admitting: Orthopaedic Surgery

## 2019-12-07 ENCOUNTER — Other Ambulatory Visit: Payer: Self-pay

## 2019-12-07 ENCOUNTER — Encounter: Payer: Self-pay | Admitting: Orthopaedic Surgery

## 2019-12-07 ENCOUNTER — Ambulatory Visit (INDEPENDENT_AMBULATORY_CARE_PROVIDER_SITE_OTHER): Payer: Medicare Other

## 2019-12-07 DIAGNOSIS — M84452A Pathological fracture, left femur, initial encounter for fracture: Secondary | ICD-10-CM

## 2019-12-07 MED ORDER — HYDROCODONE-ACETAMINOPHEN 5-325 MG PO TABS: 1.0000 | ORAL_TABLET | Freq: Two times a day (BID) | ORAL | 0 refills | Status: DC | PRN

## 2019-12-07 NOTE — Progress Notes (Signed)
   Post-Op Visit Note   Patient: Austin Oconnor           Date of Birth: 04/01/1941           MRN: [id number] Visit Date: 12/07/2019 PCP: [name], MD   Assessment & Plan:  Chief Complaint:  Chief Complaint  Patient presents with  . Left Hip - Pain, Follow-up   Visit Diagnoses:  1. Pathologic intertrochanteric fracture, left, initial encounter [facility])     Plan: Jaikob is 6 weeks status post intramedullary nailing of a pathologic intertrochanteric fracture due to metastatic prostate cancer.  He has not received any radiation treatments since surgery.  He states that he has had issues with transportation but I suspect there is also an issue with noncompliance.  He states that he has continued pain in his left hip.  His surgical scars are healed except for one area that has a small herniation.  Silver nitrate was applied to this area.  I will refill his pain medication.  I stressed the importance of getting radiation in order to treat the tumor so that the fracture can heal.  We will follow-up in 6 weeks with two-view x-rays of the left hip.  Follow-Up Instructions: Return in about 6 weeks (around 01/18/2020).   Orders:  Orders Placed This Encounter  Procedures  . XR FEMUR MIN 2 VIEWS LEFT   No orders of the defined types were placed in this encounter.   Imaging: XR FEMUR MIN 2 VIEWS LEFT  Result Date: 12/07/2019 Stable fixation basicervical intertrochanteric fracture without hardware complication.   PMFS History: Patient Active Problem List   Diagnosis Date Noted  . Pancytopenia ([place]) 10/21/2019  . Cardiomegaly 10/21/2019  . Pathologic intertrochanteric fracture, left, initial encounter ([place]) 10/20/2019  . Essential hypertension 10/20/2019  . Goals of care, counseling/discussion 08/16/2019  . Malignant neoplasm of prostate ([place]) 02/05/2016  . 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 At risk for sleep apnea    STOP-BANG= 4       . Elevated PSA   . Hypertension   . Nocturia   . Prostate cancer ([name])   . Wears partial dentures     Family History  Problem Relation Age of Onset  . Prostate cancer Brother   . Cancer Neg Hx   . Breast cancer Neg Hx   . Colon cancer Neg Hx   . Pancreatic cancer Neg Hx     Past Surgical History:  Procedure Laterality Date  . INGUINAL HERNIA REPAIR Bilateral 1990  &  1960s  . INTRAMEDULLARY (IM) NAIL INTERTROCHANTERIC Left 10/21/2019   Procedure: LEFT INTERTROCHANTERIC INTRAMEDULLARY (IM) NAIL;  Surgeon: Leandrew Koyanagi, MD;  Location: [place];  Service: Orthopedics;  Laterality: Left;  . LUMBAR SPINE SURGERY  age 69's  . PROSTATE BIOPSY N/A 09/16/2014   Procedure: BIOPSY TRANSRECTAL ULTRASONIC PROSTATE (TUBP);  Surgeon: Arvil Persons, MD;  Location: [facility];  Service: Urology;  Laterality: N/A;   Social History   Occupational History  . Not on file  Tobacco Use  . Smoking status: Never Smoker  . Smokeless tobacco: Never Used  Substance and Sexual Activity  . Alcohol use: No  . Drug use: No  . Sexual activity: Not Currently

## 2019-12-08 ENCOUNTER — Encounter: Payer: Self-pay | Admitting: Urology

## 2019-12-08 NOTE — Progress Notes (Signed)
Ok fantastic.  Thank you.

## 2019-12-08 NOTE — Progress Notes (Signed)
Pateint's daughter has called back and reports that after speaking with Dr. Erlinda Hong, she and her dad are in agreement to proceed with post-op XRT to the left hip/proximal femur.  His daughter is his sole source of transportation and can only bring him for [facility] on Monday 12/13/19 due to her work schedule. He is tentatively scheduled for CT SIM at 3pm on Monday 12/13/19 and will sign written consent to proceed at that time, via WebEx consent with Shona Simpson. We will aim to begin his daily treatments on 12/20/19 and will share this information with Dr. Erlinda Hong to keep him in the loop as well.  Austin Oconnor, MMS, PA-C [place] [facility]: [phone]  Fax: [phone]

## 2019-12-10 NOTE — Progress Notes (Signed)
  Radiation Oncology         [phone] ________________________________  Name: Austin Oconnor MRN: [id number]  Date: 12/13/2019  DOB: 05/02/1941  SIMULATION AND TREATMENT PLANNING NOTE    ICD-10-CM   1. Bone metastasis (HCC)  C79.51     DIAGNOSIS:  79 yo man with left hip metastasis  NARRATIVE:  The patient was brought to the [place].  Identity was confirmed.  All relevant records and images related to the planned course of therapy were reviewed.  The patient freely provided informed written consent to proceed with treatment after reviewing the details related to the planned course of therapy. The consent form was witnessed and verified by the simulation staff.  Then, the patient was set-up in a stable reproducible  supine position for radiation therapy.  CT images were obtained.  Surface markings were placed.  The CT images were loaded into the planning software.  Then the target and avoidance structures were contoured.  Treatment planning then occurred.  The radiation prescription was entered and confirmed.  Then, I designed and supervised the construction of a total of 3 medically necessary complex treatment devices consisting of leg positioner and MLC apertures to cover the treated hip area.  I have requested : 3D Simulation  I have requested a DVH of the following structures: Rectum, Bladder, femoral heads and target.  PLAN:  The patient will receive 30 Gy in 10 fractions.  ________________________________  Sheral Apley Tammi Klippel, M.D.

## 2019-12-13 ENCOUNTER — Other Ambulatory Visit: Payer: Self-pay

## 2019-12-13 ENCOUNTER — Encounter (INDEPENDENT_AMBULATORY_CARE_PROVIDER_SITE_OTHER): Payer: Self-pay

## 2019-12-13 ENCOUNTER — Ambulatory Visit
Admission: RE | Admit: 2019-12-13 | Discharge: 2019-12-13 | Disposition: A | Discharge status: Home / Self Care | Payer: Medicare Other | Source: Ambulatory Visit | Attending: Radiation Oncology | Admitting: Radiation Oncology

## 2019-12-13 DIAGNOSIS — C61 Malignant neoplasm of prostate: Secondary | ICD-10-CM | POA: Diagnosis present

## 2019-12-13 DIAGNOSIS — C7951 Secondary malignant neoplasm of bone: Secondary | ICD-10-CM | POA: Insufficient documentation

## 2019-12-14 DIAGNOSIS — C7951 Secondary malignant neoplasm of bone: Secondary | ICD-10-CM | POA: Diagnosis not present

## 2019-12-15 ENCOUNTER — Ambulatory Visit: Payer: Medicare Other | Admitting: Physician Assistant

## 2019-12-15 ENCOUNTER — Encounter (HOSPITAL_COMMUNITY): Payer: Self-pay | Admitting: Orthopedic Surgery

## 2019-12-15 ENCOUNTER — Encounter: Payer: Self-pay | Admitting: Physician Assistant

## 2019-12-15 ENCOUNTER — Ambulatory Visit (INDEPENDENT_AMBULATORY_CARE_PROVIDER_SITE_OTHER): Payer: Medicare Other

## 2019-12-15 ENCOUNTER — Ambulatory Visit (INDEPENDENT_AMBULATORY_CARE_PROVIDER_SITE_OTHER): Payer: Medicare Other | Admitting: Physician Assistant

## 2019-12-15 ENCOUNTER — Other Ambulatory Visit: Payer: Self-pay

## 2019-12-15 DIAGNOSIS — M84452A Pathological fracture, left femur, initial encounter for fracture: Secondary | ICD-10-CM

## 2019-12-15 MED ORDER — CEPHALEXIN 500 MG PO CAPS: 500.0000 mg | ORAL_CAPSULE | Freq: Four times a day (QID) | ORAL | 0 refills | Status: DC

## 2019-12-15 MED ORDER — OXYCODONE-ACETAMINOPHEN 5-325 MG PO TABS: 1.0000 | ORAL_TABLET | Freq: Three times a day (TID) | ORAL | 0 refills | Status: DC | PRN

## 2019-12-15 NOTE — Progress Notes (Signed)
Post-Op Visit Note   Patient: Austin Oconnor           Date of Birth: Jul 19, 1941           MRN: [id number] Visit Date: 12/15/2019 PCP: [name], MD   Assessment & Plan:  Chief Complaint:  Chief Complaint  Patient presents with  . Left Hip - Routine Post Op   Visit Diagnoses:  1. Pathologic intertrochanteric fracture, left, initial encounter [facility])     Plan: Patient is a pleasant 79 year old gentleman who comes in today 7 weeks out left hip intramedullary nail from a pathologic intertrochanteric femur fracture.  He has been doing okay.  He does note recent increase in pain to the lateral hip going down to the knee.  When he was seen in our office last week, he had a small herniation to the proximal incision which was treated with silver nitrate.  He notes that this herniation has grown in size and has become more tender.  No fevers or chills.  No drainage.  Examination of his left hip reveals a grape sized radiation to the proximal incision.  There does appear to be some ulceration with slight bloody drainage.  He has diffuse induration to the area.  No warmth.  No erythema.  Today, the area was able to be probed approximately 5-1/2 cm and then the area was aspirated and cultured.  We have sent this out urgently.  We will go ahead and start the patient on Keflex.  Dr. Marlou Sa saw and examined the patient today is planning on taking him to the operating room tomorrow for formal I&D.  Risk, benefits and poss complications reviewed.  Rehab recovery time discussed.  All questions were answered.  Follow-Up Instructions: Return in about 6 days (around 12/21/2019).   Orders:  Orders Placed This Encounter  Procedures  . XR FEMUR MIN 2 VIEWS LEFT   Meds ordered this encounter  Medications  . cephALEXin (KEFLEX) 500 MG capsule    Sig: Take 1 capsule (500 mg total) by mouth 4 (four) times daily.    Dispense:  28 capsule    Refill:  0  . oxyCODONE-acetaminophen (PERCOCET) 5-325 MG  tablet    Sig: Take 1-2 tablets by mouth 3 (three) times daily as needed for severe pain.    Dispense:  20 tablet    Refill:  0    Imaging: XR FEMUR MIN 2 VIEWS LEFT  Result Date: 12/15/2019 X-rays demonstrate stable alignment of the fracture with evidence of bony consolidation.  No other acute findings   PMFS History: Patient Active Problem List   Diagnosis Date Noted  . Pancytopenia ([place]) 10/21/2019  . Cardiomegaly 10/21/2019  . Pathologic intertrochanteric fracture, left, initial encounter ([place]) 10/20/2019  . Essential hypertension 10/20/2019  . Goals of care, counseling/discussion 08/16/2019  . Malignant neoplasm of prostate ([place]) 02/05/2016  . 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 At risk for sleep apnea    STOP-BANG= 4      . Elevated PSA   . Hypertension   . Nocturia   . Prostate cancer ([place])   . Wears partial dentures     Family History  Problem Relation Age of Onset  . Prostate cancer Brother   . Cancer Neg Hx   . Breast cancer Neg Hx   . Colon cancer Neg Hx   . Pancreatic cancer Neg Hx     Past Surgical History:  Procedure Laterality Date  .  INGUINAL HERNIA REPAIR Bilateral 1990  &  1960s  . INTRAMEDULLARY (IM) NAIL INTERTROCHANTERIC Left 10/21/2019   Procedure: LEFT INTERTROCHANTERIC INTRAMEDULLARY (IM) NAIL;  Surgeon: Leandrew Koyanagi, MD;  Location: [place];  Service: Orthopedics;  Laterality: Left;  . LUMBAR SPINE SURGERY  age 64's  . PROSTATE BIOPSY N/A 09/16/2014   Procedure: BIOPSY TRANSRECTAL ULTRASONIC PROSTATE (TUBP);  Surgeon: Arvil Persons, MD;  Location: [facility];  Service: Urology;  Laterality: N/A;   Social History   Occupational History  . Not on file  Tobacco Use  . Smoking status: Never Smoker  . Smokeless tobacco: Never Used  Substance and Sexual Activity  . Alcohol use: No  . Drug use: No  . Sexual activity: Not Currently

## 2019-12-15 NOTE — Progress Notes (Signed)
Pt denies SOB, chest pain, and being under the care of a cardiologist. Pt denies having a stress test and cardiac cath. Pt made aware to stop taking  Aspirin (unless otherwise advised by surgeon), vitamins, fish oil and herbal medications. Do not take any NSAIDs ie: Ibuprofen, Advil, Naproxen (Aleve), Motrin, BC and Goody Powder. Pt stated that he has no transportation to get COVID test on [facility] " they usually do my test before surgery."  Pt requested that daughter, Shelton Silvas be provided with pre-op instructions. Daughter verbalized understanding of all pre-op instructions.

## 2019-12-16 ENCOUNTER — Ambulatory Visit: Payer: Medicare Other | Admitting: Physician Assistant

## 2019-12-16 ENCOUNTER — Encounter (HOSPITAL_COMMUNITY)
Admission: RE | Disposition: A | Discharge status: Home / Self Care | Payer: Self-pay | Source: Home / Self Care | Attending: Orthopedic Surgery

## 2019-12-16 ENCOUNTER — Ambulatory Visit (HOSPITAL_COMMUNITY): Payer: Medicare Other | Admitting: Anesthesiology

## 2019-12-16 ENCOUNTER — Other Ambulatory Visit: Payer: Self-pay

## 2019-12-16 ENCOUNTER — Encounter (HOSPITAL_COMMUNITY): Payer: Self-pay | Admitting: Orthopedic Surgery

## 2019-12-16 ENCOUNTER — Observation Stay (HOSPITAL_COMMUNITY)
Admission: RE | Admit: 2019-12-16 | Discharge: 2019-12-17 | Disposition: A | Discharge status: Home / Self Care | Payer: Medicare Other | Attending: Orthopedic Surgery | Admitting: Orthopedic Surgery

## 2019-12-16 DIAGNOSIS — Y838 Other surgical procedures as the cause of abnormal reaction of the patient, or of later complication, without mention of misadventure at the time of the procedure: Secondary | ICD-10-CM | POA: Insufficient documentation

## 2019-12-16 DIAGNOSIS — L02436 Carbuncle of left lower limb: Secondary | ICD-10-CM

## 2019-12-16 DIAGNOSIS — Z20822 Contact with and (suspected) exposure to covid-19: Secondary | ICD-10-CM | POA: Insufficient documentation

## 2019-12-16 DIAGNOSIS — Z79899 Other long term (current) drug therapy: Secondary | ICD-10-CM | POA: Insufficient documentation

## 2019-12-16 DIAGNOSIS — L02419 Cutaneous abscess of limb, unspecified: Secondary | ICD-10-CM | POA: Diagnosis present

## 2019-12-16 DIAGNOSIS — I10 Essential (primary) hypertension: Secondary | ICD-10-CM | POA: Diagnosis not present

## 2019-12-16 DIAGNOSIS — Z9889 Other specified postprocedural states: Secondary | ICD-10-CM | POA: Insufficient documentation

## 2019-12-16 DIAGNOSIS — Z8546 Personal history of malignant neoplasm of prostate: Secondary | ICD-10-CM | POA: Diagnosis not present

## 2019-12-16 DIAGNOSIS — L02416 Cutaneous abscess of left lower limb: Secondary | ICD-10-CM | POA: Diagnosis not present

## 2019-12-16 HISTORY — PX: I & D EXTREMITY: SHX5045

## 2019-12-16 HISTORY — DX: Unspecified infectious disease: B99.9

## 2019-12-16 LAB — BASIC METABOLIC PANEL
Anion gap: 11 (ref 5–15)
BUN: 16 mg/dL (ref 8–23)
CO2: 22 mmol/L (ref 22–32)
Calcium: 8.9 mg/dL (ref 8.9–10.3)
Chloride: 105 mmol/L (ref 98–111)
Creatinine, Ser: 0.77 mg/dL (ref 0.61–1.24)
GFR calc Af Amer: >60 mL/min (ref >60)
GFR calc non Af Amer: >60 mL/min (ref >60)
Glucose, Bld: 88 mg/dL (ref 70–99)
Potassium: 4 mmol/L (ref 3.5–5.1)
Sodium: 138 mmol/L (ref 135–145)

## 2019-12-16 LAB — CBC
HCT: 26.5 % — ABNORMAL LOW (ref 39.0–52.0)
Hemoglobin: 8.1 g/dL — ABNORMAL LOW (ref 13.0–17.0)
MCH: 28.4 pg (ref 26.0–34.0)
MCHC: 30.6 g/dL (ref 30.0–36.0)
MCV: 93 fL (ref 80.0–100.0)
Platelets: 104 10*3/uL — ABNORMAL LOW (ref 150–400)
RBC: 2.85 MIL/uL — ABNORMAL LOW (ref 4.22–5.81)
RDW: 24 % — ABNORMAL HIGH (ref 11.5–15.5)
WBC: 7.9 10*3/uL (ref 4.0–10.5)
nRBC: 9 % — ABNORMAL HIGH (ref 0.0–0.2)

## 2019-12-16 LAB — RESPIRATORY PANEL BY RT PCR (FLU A&B, COVID)
Influenza A by PCR: NEGATIVE
Influenza B by PCR: NEGATIVE
SARS Coronavirus 2 by RT PCR: NEGATIVE

## 2019-12-16 SURGERY — IRRIGATION AND DEBRIDEMENT EXTREMITY
Anesthesia: General | Laterality: Left

## 2019-12-16 MED ORDER — FENTANYL CITRATE (PF) 250 MCG/5ML IJ SOLN
INTRAMUSCULAR | Status: AC
  Filled 2019-12-16: qty 5

## 2019-12-16 MED ORDER — OXYCODONE HCL 5 MG PO TABS
ORAL_TABLET | ORAL | Status: AC
  Filled 2019-12-16: qty 1

## 2019-12-16 MED ORDER — BUPIVACAINE HCL (PF) 0.5 % IJ SOLN
INTRAMUSCULAR | Status: AC
  Filled 2019-12-16: qty 30

## 2019-12-16 MED ORDER — CEFAZOLIN SODIUM-DEXTROSE 2-4 GM/100ML-% IV SOLN
INTRAVENOUS | Status: AC
  Filled 2019-12-16: qty 100

## 2019-12-16 MED ORDER — ONDANSETRON HCL 4 MG/2ML IJ SOLN
INTRAMUSCULAR | Status: DC | PRN
  Administered 2019-12-16: 4 mg via INTRAVENOUS

## 2019-12-16 MED ORDER — CHLORHEXIDINE GLUCONATE 4 % EX LIQD: 60.0000 mL | Freq: Once | CUTANEOUS | Status: DC

## 2019-12-16 MED ORDER — OXYCODONE HCL 5 MG PO TABS
5.0000 mg | ORAL_TABLET | ORAL | Status: DC | PRN
  Administered 2019-12-16 – 2019-12-17 (×2): 5 mg via ORAL
  Filled 2019-12-16: qty 1
  Filled 2019-12-16: qty 2

## 2019-12-16 MED ORDER — ONDANSETRON HCL 4 MG PO TABS: 4.0000 mg | ORAL_TABLET | Freq: Four times a day (QID) | ORAL | Status: DC | PRN

## 2019-12-16 MED ORDER — CELECOXIB 200 MG PO CAPS
200.0000 mg | ORAL_CAPSULE | Freq: Two times a day (BID) | ORAL | Status: DC
  Administered 2019-12-16 – 2019-12-17 (×2): 200 mg via ORAL
  Filled 2019-12-16 (×2): qty 1

## 2019-12-16 MED ORDER — PROPOFOL 10 MG/ML IV BOLUS
INTRAVENOUS | Status: DC | PRN
  Administered 2019-12-16: 140 mg via INTRAVENOUS

## 2019-12-16 MED ORDER — BUPIVACAINE HCL (PF) 0.25 % IJ SOLN
INTRAMUSCULAR | Status: AC
  Filled 2019-12-16: qty 30

## 2019-12-16 MED ORDER — VANCOMYCIN HCL 1000 MG IV SOLR
INTRAVENOUS | Status: AC
  Filled 2019-12-16: qty 1000

## 2019-12-16 MED ORDER — DOCUSATE SODIUM 100 MG PO CAPS
100.0000 mg | ORAL_CAPSULE | Freq: Two times a day (BID) | ORAL | Status: DC
  Administered 2019-12-16 – 2019-12-17 (×2): 100 mg via ORAL
  Filled 2019-12-16 (×2): qty 1

## 2019-12-16 MED ORDER — PROMETHAZINE HCL 25 MG/ML IJ SOLN: 6.2500 mg | INTRAMUSCULAR | Status: DC | PRN

## 2019-12-16 MED ORDER — BUPIVACAINE HCL (PF) 0.5 % IJ SOLN
INTRAMUSCULAR | Status: DC | PRN
  Administered 2019-12-16: 30 mL

## 2019-12-16 MED ORDER — DEXAMETHASONE SODIUM PHOSPHATE 10 MG/ML IJ SOLN
INTRAMUSCULAR | Status: DC | PRN
  Administered 2019-12-16: 4 mg via INTRAVENOUS

## 2019-12-16 MED ORDER — CEFAZOLIN SODIUM-DEXTROSE 2-4 GM/100ML-% IV SOLN
2.0000 g | Freq: Four times a day (QID) | INTRAVENOUS | Status: AC
  Administered 2019-12-16 – 2019-12-17 (×3): 2 g via INTRAVENOUS
  Filled 2019-12-16 (×3): qty 100

## 2019-12-16 MED ORDER — METHOCARBAMOL 1000 MG/10ML IJ SOLN
500.0000 mg | Freq: Four times a day (QID) | INTRAVENOUS | Status: DC | PRN
  Filled 2019-12-16: qty 5

## 2019-12-16 MED ORDER — ACETAMINOPHEN 325 MG PO TABS: 325.0000 mg | ORAL_TABLET | Freq: Four times a day (QID) | ORAL | Status: DC | PRN

## 2019-12-16 MED ORDER — LACTATED RINGERS IV SOLN: INTRAVENOUS | Status: AC

## 2019-12-16 MED ORDER — OXYCODONE HCL 5 MG PO TABS
5.0000 mg | ORAL_TABLET | Freq: Once | ORAL | Status: AC | PRN
  Administered 2019-12-16: 20:00:00 5 mg via ORAL

## 2019-12-16 MED ORDER — ONDANSETRON HCL 4 MG/2ML IJ SOLN
INTRAMUSCULAR | Status: AC
  Filled 2019-12-16: qty 2

## 2019-12-16 MED ORDER — CLONIDINE HCL (ANALGESIA) 100 MCG/ML EP SOLN
EPIDURAL | Status: AC
  Filled 2019-12-16: qty 10

## 2019-12-16 MED ORDER — SODIUM CHLORIDE 0.9 % IR SOLN
Status: DC | PRN
  Administered 2019-12-16: 3000 mL

## 2019-12-16 MED ORDER — CLONIDINE HCL (ANALGESIA) 100 MCG/ML EP SOLN
EPIDURAL | Status: DC | PRN
  Administered 2019-12-16: .75 mL

## 2019-12-16 MED ORDER — ASPIRIN 81 MG PO CHEW
81.0000 mg | CHEWABLE_TABLET | Freq: Every day | ORAL | Status: DC
  Administered 2019-12-16 – 2019-12-17 (×2): 81 mg via ORAL
  Filled 2019-12-16 (×2): qty 1

## 2019-12-16 MED ORDER — CEFAZOLIN SODIUM-DEXTROSE 2-4 GM/100ML-% IV SOLN
2.0000 g | INTRAVENOUS | Status: AC
  Administered 2019-12-16: 2 g via INTRAVENOUS

## 2019-12-16 MED ORDER — FENTANYL CITRATE (PF) 100 MCG/2ML IJ SOLN
INTRAMUSCULAR | Status: AC
  Filled 2019-12-16: qty 2

## 2019-12-16 MED ORDER — ONDANSETRON HCL 4 MG/2ML IJ SOLN: 4.0000 mg | Freq: Four times a day (QID) | INTRAMUSCULAR | Status: DC | PRN

## 2019-12-16 MED ORDER — VANCOMYCIN HCL 1000 MG IV SOLR
INTRAVENOUS | Status: DC | PRN
  Administered 2019-12-16: 1000 mg via TOPICAL

## 2019-12-16 MED ORDER — METOCLOPRAMIDE HCL 5 MG PO TABS: 5.0000 mg | ORAL_TABLET | Freq: Three times a day (TID) | ORAL | Status: DC | PRN

## 2019-12-16 MED ORDER — LIDOCAINE 2% (20 MG/ML) 5 ML SYRINGE
INTRAMUSCULAR | Status: AC
  Filled 2019-12-16: qty 5

## 2019-12-16 MED ORDER — OXYCODONE-ACETAMINOPHEN 5-325 MG PO TABS
1.0000 | ORAL_TABLET | Freq: Once | ORAL | Status: AC
  Administered 2019-12-16: 14:00:00 1 via ORAL
  Filled 2019-12-16: qty 1

## 2019-12-16 MED ORDER — LACTATED RINGERS IV SOLN: INTRAVENOUS | Status: DC

## 2019-12-16 MED ORDER — METOCLOPRAMIDE HCL 5 MG/ML IJ SOLN: 5.0000 mg | Freq: Three times a day (TID) | INTRAMUSCULAR | Status: DC | PRN

## 2019-12-16 MED ORDER — ROCURONIUM BROMIDE 10 MG/ML (PF) SYRINGE
PREFILLED_SYRINGE | INTRAVENOUS | Status: AC
  Filled 2019-12-16: qty 10

## 2019-12-16 MED ORDER — 0.9 % SODIUM CHLORIDE (POUR BTL) OPTIME
TOPICAL | Status: DC | PRN
  Administered 2019-12-16: 17:00:00 1000 mL

## 2019-12-16 MED ORDER — METHOCARBAMOL 500 MG PO TABS
500.0000 mg | ORAL_TABLET | Freq: Four times a day (QID) | ORAL | Status: DC | PRN
  Administered 2019-12-16 – 2019-12-17 (×2): 500 mg via ORAL
  Filled 2019-12-16 (×2): qty 1

## 2019-12-16 MED ORDER — ACETAMINOPHEN 500 MG PO TABS: 1000.0000 mg | ORAL_TABLET | Freq: Once | ORAL | Status: AC

## 2019-12-16 MED ORDER — FENTANYL CITRATE (PF) 100 MCG/2ML IJ SOLN
INTRAMUSCULAR | Status: DC | PRN
  Administered 2019-12-16 (×4): 25 ug via INTRAVENOUS
  Administered 2019-12-16: 50 ug via INTRAVENOUS

## 2019-12-16 MED ORDER — FENTANYL CITRATE (PF) 100 MCG/2ML IJ SOLN
25.0000 ug | INTRAMUSCULAR | Status: DC | PRN
  Administered 2019-12-16 (×2): 50 ug via INTRAVENOUS

## 2019-12-16 MED ORDER — LISINOPRIL 10 MG PO TABS
10.0000 mg | ORAL_TABLET | Freq: Every evening | ORAL | Status: DC
  Administered 2019-12-16: 21:00:00 10 mg via ORAL
  Filled 2019-12-16: qty 1

## 2019-12-16 MED ORDER — DEXAMETHASONE SODIUM PHOSPHATE 10 MG/ML IJ SOLN
INTRAMUSCULAR | Status: AC
  Filled 2019-12-16: qty 1

## 2019-12-16 MED ORDER — LIDOCAINE 2% (20 MG/ML) 5 ML SYRINGE
INTRAMUSCULAR | Status: DC | PRN
  Administered 2019-12-16: 70 mg via INTRAVENOUS

## 2019-12-16 MED ORDER — ACETAMINOPHEN 500 MG PO TABS
ORAL_TABLET | ORAL | Status: AC
  Administered 2019-12-16: 13:00:00 1000 mg via ORAL
  Filled 2019-12-16: qty 2

## 2019-12-16 MED ORDER — PROPOFOL 10 MG/ML IV BOLUS
INTRAVENOUS | Status: AC
  Filled 2019-12-16: qty 20

## 2019-12-16 MED ORDER — OXYCODONE HCL 5 MG/5ML PO SOLN: 5.0000 mg | Freq: Once | ORAL | Status: AC | PRN

## 2019-12-16 SURGICAL SUPPLY — 64 items
ALCOHOL 70% 16 OZ (MISCELLANEOUS) ×3 IMPLANT
APL SKNCLS STERI-STRIP NONHPOA (GAUZE/BANDAGES/DRESSINGS) ×1
BENZOIN TINCTURE PRP APPL 2/3 (GAUZE/BANDAGES/DRESSINGS) ×2 IMPLANT
BNDG COHESIVE 4X5 TAN STRL (GAUZE/BANDAGES/DRESSINGS) IMPLANT
BNDG ELASTIC 4X5.8 VLCR STR LF (GAUZE/BANDAGES/DRESSINGS) IMPLANT
BNDG GAUZE ELAST 4 BULKY (GAUZE/BANDAGES/DRESSINGS) IMPLANT
CANISTER WOUNDNEG PRESSURE 500 (CANNISTER) ×2 IMPLANT
COVER SURGICAL LIGHT HANDLE (MISCELLANEOUS) ×3 IMPLANT
COVER WAND RF STERILE (DRAPES) ×1 IMPLANT
CUFF TOURN SGL QUICK 18X4 (TOURNIQUET CUFF) IMPLANT
CUFF TOURN SGL QUICK 24 (TOURNIQUET CUFF)
CUFF TOURN SGL QUICK 34 (TOURNIQUET CUFF)
CUFF TOURN SGL QUICK 42 (TOURNIQUET CUFF) IMPLANT
CUFF TRNQT CYL 24X4X16.5-23 (TOURNIQUET CUFF) IMPLANT
CUFF TRNQT CYL 34X4.125X (TOURNIQUET CUFF) IMPLANT
DRAPE HALF SHEET 40X57 (DRAPES) ×4 IMPLANT
DRAPE IMP U-DRAPE 54X76 (DRAPES) ×2 IMPLANT
DRAPE U-SHAPE 47X51 STRL (DRAPES) ×3 IMPLANT
DRESSING PEEL AND PLC PRVNA 13 (GAUZE/BANDAGES/DRESSINGS) IMPLANT
DRSG PAD ABDOMINAL 8X10 ST (GAUZE/BANDAGES/DRESSINGS) IMPLANT
DRSG PEEL AND PLACE PREVENA 13 (GAUZE/BANDAGES/DRESSINGS) ×3
DURAPREP 26ML APPLICATOR (WOUND CARE) ×3 IMPLANT
ELECT REM PT RETURN 9FT ADLT (ELECTROSURGICAL) ×3
ELECTRODE REM PT RTRN 9FT ADLT (ELECTROSURGICAL) ×1 IMPLANT
GAUZE SPONGE 4X4 12PLY STRL (GAUZE/BANDAGES/DRESSINGS) IMPLANT
GAUZE XEROFORM 5X9 LF (GAUZE/BANDAGES/DRESSINGS) IMPLANT
GLOVE BIOGEL PI IND STRL 7.0 (GLOVE) ×1 IMPLANT
GLOVE BIOGEL PI IND STRL 8 (GLOVE) ×1 IMPLANT
GLOVE BIOGEL PI INDICATOR 7.0 (GLOVE) ×2
GLOVE BIOGEL PI INDICATOR 8 (GLOVE) ×2
GLOVE ECLIPSE 7.0 STRL STRAW (GLOVE) ×3 IMPLANT
GLOVE ECLIPSE 8.0 STRL XLNG CF (GLOVE) ×3 IMPLANT
GOWN STRL REUS W/ TWL LRG LVL3 (GOWN DISPOSABLE) ×2 IMPLANT
GOWN STRL REUS W/ TWL XL LVL3 (GOWN DISPOSABLE) ×1 IMPLANT
GOWN STRL REUS W/TWL LRG LVL3 (GOWN DISPOSABLE) ×6
GOWN STRL REUS W/TWL XL LVL3 (GOWN DISPOSABLE) ×3
HANDPIECE INTERPULSE COAX TIP (DISPOSABLE)
KIT BASIN OR (CUSTOM PROCEDURE TRAY) ×3 IMPLANT
KIT TURNOVER KIT B (KITS) ×3 IMPLANT
MANIFOLD NEPTUNE II (INSTRUMENTS) ×3 IMPLANT
NS IRRIG 1000ML POUR BTL (IV SOLUTION) ×3 IMPLANT
PACK GENERAL/GYN (CUSTOM PROCEDURE TRAY) ×2 IMPLANT
PACK ORTHO EXTREMITY (CUSTOM PROCEDURE TRAY) ×1 IMPLANT
PAD ARMBOARD 7.5X6 YLW CONV (MISCELLANEOUS) ×6 IMPLANT
PAD CAST 4YDX4 CTTN HI CHSV (CAST SUPPLIES) IMPLANT
PADDING CAST COTTON 4X4 STRL (CAST SUPPLIES)
SET HNDPC FAN SPRY TIP SCT (DISPOSABLE) IMPLANT
SPONGE LAP 18X18 RF (DISPOSABLE) IMPLANT
SPONGE LAP 4X18 RFD (DISPOSABLE) IMPLANT
STOCKINETTE IMPERVIOUS 9X36 MD (GAUZE/BANDAGES/DRESSINGS) IMPLANT
SUT ETHILON 2 0 FS 18 (SUTURE) ×2 IMPLANT
SUT ETHILON 2 LR (SUTURE) ×6 IMPLANT
SUT ETHILON 3 0 PS 1 (SUTURE) IMPLANT
SUT VIC AB 3-0 SH 27 (SUTURE)
SUT VIC AB 3-0 SH 27X BRD (SUTURE) IMPLANT
SWAB CULTURE ESWAB REG 1ML (MISCELLANEOUS) ×2 IMPLANT
SWAB CULTURE LIQ STUART DBL (MISCELLANEOUS) ×2 IMPLANT
TOWEL GREEN STERILE (TOWEL DISPOSABLE) ×3 IMPLANT
TOWEL GREEN STERILE FF (TOWEL DISPOSABLE) ×3 IMPLANT
TUBE CONNECTING 12'X1/4 (SUCTIONS)
TUBE CONNECTING 12X1/4 (SUCTIONS) ×1 IMPLANT
UNDERPAD 30X30 (UNDERPADS AND DIAPERS) ×3 IMPLANT
WATER STERILE IRR 1000ML POUR (IV SOLUTION) ×1 IMPLANT
YANKAUER SUCT BULB TIP NO VENT (SUCTIONS) ×1 IMPLANT

## 2019-12-16 NOTE — Anesthesia Procedure Notes (Signed)
Procedure Name: LMA Insertion Performed by: Eupha Lobb H, CRNA Pre-anesthesia Checklist: Patient identified, Emergency Drugs available, Suction available and Patient being monitored Patient Re-evaluated:Patient Re-evaluated prior to induction Oxygen Delivery Method: Circle System Utilized Preoxygenation: Pre-oxygenation with 100% oxygen Induction Type: IV induction Ventilation: Mask ventilation without difficulty LMA: LMA inserted and LMA with gastric port inserted LMA Size: 4.0 Number of attempts: 1 Airway Equipment and Method: Bite block Placement Confirmation: positive ETCO2 Tube secured with: Tape Dental Injury: Teeth and Oropharynx as per pre-operative assessment        

## 2019-12-16 NOTE — Transfer of Care (Signed)
Immediate Anesthesia Transfer of Care Note  Patient: [name]  Procedure(s) Performed: Left hip incision and drainage (Left )  Patient Location: PACU  Anesthesia Type:General  Level of Consciousness: awake  Airway & Oxygen Therapy: Patient Spontanous Breathing  Post-op Assessment: Report given to RN and Post -op Vital signs reviewed and stable  Post vital signs: Reviewed and stable  Last Vitals:  Vitals Value Taken Time  BP 138/87 12/16/19 1746  Temp    Pulse 89 12/16/19 1754  Resp 28 12/16/19 1754  SpO2 100 % 12/16/19 1754  Vitals shown include unvalidated device data.  Last Pain:  Vitals:   12/16/19 1259  TempSrc:   PainSc: 5       Patients Stated Pain Goal: 3 ([id number])  Complications: No apparent anesthesia complications

## 2019-12-16 NOTE — Anesthesia Preprocedure Evaluation (Addendum)
Anesthesia Evaluation  Patient identified by MRN, date of birth, ID band Patient awake    Reviewed: Allergy & Precautions, NPO status , Patient's Chart, lab work & pertinent test results  History of Anesthesia Complications Negative for: history of anesthetic complications  Airway Mallampati: II  TM Distance: >3 FB Neck ROM: Full    Dental  (+) Dental Advisory Given, Partial Upper, Partial Lower   Pulmonary neg pulmonary ROS,    Pulmonary exam normal        Cardiovascular hypertension, Pt. on medications Normal cardiovascular exam     Neuro/Psych negative neurological ROS  negative psych ROS   GI/Hepatic negative GI ROS, Neg liver ROS,   Endo/Other  negative endocrine ROS  Renal/GU negative Renal ROS    Prostate cancer     Musculoskeletal  left hip infection   Abdominal   Peds  Hematology negative hematology ROS (+)   Anesthesia Other Findings Covid neg 2/4   Reproductive/Obstetrics                         Anesthesia Physical Anesthesia Plan  ASA: II  Anesthesia Plan: General   Post-op Pain Management:    Induction: Intravenous  PONV Risk Score and Plan: 2 and Treatment may vary due to age or medical condition and Ondansetron  Airway Management Planned: LMA  Additional Equipment: None  Intra-op Plan:   Post-operative Plan: Extubation in OR  Informed Consent: I have reviewed the patients History and Physical, chart, labs and discussed the procedure including the risks, benefits and alternatives for the proposed anesthesia with the patient or authorized representative who has indicated his/her understanding and acceptance.     Dental advisory given  Plan Discussed with: CRNA and Anesthesiologist  Anesthesia Plan Comments:       Anesthesia Quick Evaluation

## 2019-12-16 NOTE — Brief Op Note (Signed)
   12/16/2019  5:44 PM  PATIENT:  Austin Oconnor  79 y.o. male  PRE-OPERATIVE DIAGNOSIS:  Left hip infection  POST-OPERATIVE DIAGNOSIS:  Left hip infection  PROCEDURE:  Procedure(s): Left hip incision and drainage  SURGEON:  Surgeon(s): Marlou Sa, Tonna Corner, MD  ASSISTANT: Magnant pa*  ANESTHESIA:   general  EBL: 50 ml    Total I/O In: 100 [IV Piggyback:100] Out: 54 [Blood:50]  BLOOD ADMINISTERED: none  DRAINS: wound vac   LOCAL MEDICATIONS USED:  Marcaine mso4 marcaine  SPECIMEN:  cxs x 2 COUNTS:  YES  TOURNIQUET:  * No tourniquets in log *  DICTATION: .Other Dictation: Dictation Number [id number]  PLAN OF CARE: Admit for overnight observation  PATIENT DISPOSITION:  PACU - hemodynamically stable

## 2019-12-16 NOTE — Op Note (Signed)
NAME: Austin Oconnor, Austin Oconnor MEDICAL RECORD [id number] ACCOUNT [id number] DATE OF BIRTH:1941/05/16 FACILITY: [facility] LOCATION: [facility] PHYSICIAN:Jontrell Bushong Randel Pigg, MD  OPERATIVE REPORT  DATE OF PROCEDURE:  12/16/2019  PREOPERATIVE DIAGNOSIS:  Left hip infection.  POSTOPERATIVE DIAGNOSIS:  Left hip infection.  PROCEDURE:  Left hip  SURGEON:  Meredith Pel, MD  ASSISTANT:  Annie Main, PA-C  INDICATIONS:  The patient is a 79 year old patient who is about 2 months out left hip intertrochanteric fracture fixation for pathologic fracture.  He presents now after draining wound was noted in clinic yesterday.  The patient understands the risks and  benefits.  Cultures were obtained prior to initiation of any antibiotics in clinic yesterday.  PROCEDURE IN DETAIL:  The patient was brought to the operating room where general anesthetic was induced.  Preoperative IV antibiotics were held until cultures were obtained.  The patient was placed in lateral decubitus position with the left hip up and  the right axilla and right peroneal nerve well padded.  Ioban used to cover the operative field, after sterile prepping and draping with DuraPrep solution.  Timeout was called.  The patient had a pyogenic granulomatous type of tissue around the  midportion of the incision.  This was excised and the incision was opened and extended about 4 cm proximal and distal.  There was a pocket of purulence present.  This was debrided using rongeur and curettes.  It did not appear to go into the deep hip  capsular region or around any hardware.  No hardware was palpable or visible.  At this time  following extensive excisional debridement of skin, subcutaneous tissue, muscle and any affected fascia, thorough irrigation with 3 L of irrigating solution was  performed.  Following this, vancomycin powder was placed into the incision, which was then reapproximated loosely using three #2 nylon sutures.  An incisional wound VAC  was placed.  The patient tolerated the procedure well without immediate  complications.  We will keep him overnight for IV antibiotics and plan for discharge with oral antibiotics.  Luke's assistance was required for retraction and suturing.  His assistance was a medical necessity.  VN/NUANCE  D:12/16/2019 T:12/16/2019 JOB:[id number]

## 2019-12-16 NOTE — Progress Notes (Signed)
OR nurse came to assess dressing for wound vac. Dressing saturated with serosanginous. No leaks, drainage reaching canister. Dr. Marlou Sa notified and aware.

## 2019-12-16 NOTE — H&P (Signed)
Austin Oconnor is an 79 y.o. male.   Chief Complaint: Left hip drainage HPI: Austin Oconnor is a 79 year old patient is about 2 months out from left hip intertrochanteric nail for pathologic fracture.  He had some drainage last week in the clinic which was treated with silver nitrate however that increased.  Dr. Sherrian Divers is currently out of town and so I was asked to see him yesterday by Mendel Ryder the PA.  There was a granulomatous type bubble of tissue which was in the middle of the incision.  This was incised and cultures were obtained.  I did probe fairly deeply down to the fascia lata.  Firm hematoma was present.  Plan now is for I&D with probable wound VAC placement.  He was placed on oral antibiotics after cultures were obtained.  Past Medical History:  Diagnosis Date  . At risk for sleep apnea    STOP-BANG= 4      . Elevated PSA   . Hypertension   . Infection    left hip  . Nocturia   . Prostate cancer ([place])   . Wears partial dentures     Past Surgical History:  Procedure Laterality Date  . INGUINAL HERNIA REPAIR Bilateral 1990  &  1960s  . INTRAMEDULLARY (IM) NAIL INTERTROCHANTERIC Left 10/21/2019   Procedure: LEFT INTERTROCHANTERIC INTRAMEDULLARY (IM) NAIL;  Surgeon: Leandrew Koyanagi, MD;  Location: [facility];  Service: Orthopedics;  Laterality: Left;  . LUMBAR SPINE SURGERY  age 21's  . PROSTATE BIOPSY N/A 09/16/2014   Procedure: BIOPSY TRANSRECTAL ULTRASONIC PROSTATE (TUBP);  Surgeon: Arvil Persons, MD;  Location: [facility];  Service: Urology;  Laterality: N/A;    Family History  Problem Relation Age of Onset  . Prostate cancer Brother   . Cancer Neg Hx   . Breast cancer Neg Hx   . Colon cancer Neg Hx   . Pancreatic cancer Neg Hx    Social History:  reports that he has never smoked. He has never used smokeless tobacco. He reports that he does not drink alcohol or use drugs.  Allergies: No Known Allergies  Medications Prior to Admission  Medication Sig Dispense Refill  .  alendronate (FOSAMAX) 70 MG tablet Take 70 mg by mouth once a week.    . calcium-vitamin D (OSCAL WITH D) 500-200 MG-UNIT tablet Take 1 tablet by mouth 3 (three) times daily. 90 tablet 6  . cephALEXin (KEFLEX) 500 MG capsule Take 1 capsule (500 mg total) by mouth 4 (four) times daily. 28 capsule 0  . Cholecalciferol (VITAMIN D3) 1000 UNITS CAPS Take 1 capsule by mouth daily.    Marland Kitchen HYDROcodone-acetaminophen (NORCO) 5-325 MG tablet Take 1-2 tablets by mouth every 6 (six) hours as needed for moderate pain. 30 tablet 0  . HYDROcodone-acetaminophen (NORCO) 5-325 MG tablet Take 1-2 tablets by mouth 2 (two) times daily as needed. 30 tablet 0  . lisinopril (PRINIVIL,ZESTRIL) 10 MG tablet Take 1 tablet by mouth every evening.     . methocarbamol (ROBAXIN) 750 MG tablet Take 1 tablet (750 mg total) by mouth 2 (two) times daily as needed for muscle spasms. 60 tablet 0  . oxyCODONE (OXY IR/ROXICODONE) 5 MG immediate release tablet Take 1 tablet (5 mg total) by mouth every 4 (four) hours as needed for severe pain. 60 tablet 0  . oxyCODONE-acetaminophen (PERCOCET) 5-325 MG tablet Take 1-2 tablets by mouth 3 (three) times daily as needed for severe pain. 20 tablet 0  . traMADol (ULTRAM) 50 MG  tablet Take 1-2 tablets (50-100 mg total) by mouth 3 (three) times daily as needed. 30 tablet 2  . vitamin B-12 (CYANOCOBALAMIN) 1000 MCG tablet Take 1,000 mcg by mouth daily.    Marland Kitchen zinc sulfate 220 (50 Zn) MG capsule Take 1 capsule (220 mg total) by mouth daily. 42 capsule 0  . fentaNYL (DURAGESIC) 25 MCG/HR Place 1 patch onto the skin every 3 (three) days. (Patient not taking: Reported on 11/17/2019) 10 patch 0  . ondansetron (ZOFRAN ODT) 4 MG disintegrating tablet Take 1 tablet (4 mg total) by mouth every 8 (eight) hours as needed for nausea or vomiting. (Patient not taking: Reported on 11/17/2019) 20 tablet 0    Results for orders placed or performed during the hospital encounter of 12/16/19 (from the past 48 hour(s))   Respiratory Panel by RT PCR (Flu A&B, Covid) - Nasopharyngeal Swab     Status: None   Collection Time: 12/16/19 12:33 PM   Specimen: Nasopharyngeal Swab  Result Value Ref Range   SARS Coronavirus 2 by RT PCR NEGATIVE NEGATIVE    Comment: (NOTE) SARS-CoV-2 target nucleic acids are NOT DETECTED. The SARS-CoV-2 RNA is generally detectable in upper respiratoy specimens during the acute phase of infection. The lowest concentration of SARS-CoV-2 viral copies this assay can detect is 131 copies/mL. A negative result does not preclude SARS-Cov-2 infection and should not be used as the sole basis for treatment or other patient management decisions. A negative result may occur with  improper specimen collection/handling, submission of specimen other than nasopharyngeal swab, presence of viral mutation(s) within the areas targeted by this assay, and inadequate number of viral copies (<131 copies/mL). A negative result must be combined with clinical observations, patient history, and epidemiological information. The expected result is Negative. Fact Sheet for Patients:  PinkCheek.be Fact Sheet for Healthcare Providers:  GravelBags.it This test is not yet ap proved or cleared by the [place] FDA and  has been authorized for detection and/or diagnosis of SARS-CoV-2 by FDA under an Emergency Use Authorization (EUA). This EUA will remain  in effect (meaning this test can be used) for the duration of the COVID-19 declaration under Section 564(b)(1) of the Act, 21 U.S.C. section 360bbb-3(b)(1), unless the authorization is terminated or revoked sooner.    Influenza A by PCR NEGATIVE NEGATIVE   Influenza B by PCR NEGATIVE NEGATIVE    Comment: (NOTE) The Xpert Xpress SARS-CoV-2/FLU/RSV assay is intended as an aid in  the diagnosis of influenza from Nasopharyngeal swab specimens and  should not be used as a sole basis for treatment. Nasal  washings and  aspirates are unacceptable for Xpert Xpress SARS-CoV-2/FLU/RSV  testing. Fact Sheet for Patients: PinkCheek.be Fact Sheet for Healthcare Providers: GravelBags.it This test is not yet approved or cleared by the [place] FDA and  has been authorized for detection and/or diagnosis of SARS-CoV-2 by  FDA under an Emergency Use Authorization (EUA). This EUA will remain  in effect (meaning this test can be used) for the duration of the  Covid-19 declaration under Section 564(b)(1) of the Act, 21  U.S.C. section 360bbb-3(b)(1), unless the authorization is  terminated or revoked. Performed at [facility], [place] [address], [place], [place] [id number]   Basic metabolic panel     Status: None   Collection Time: 12/16/19  1:20 PM  Result Value Ref Range   Sodium 138 135 - 145 mmol/L   Potassium 4.0 3.5 - 5.1 mmol/L   Chloride 105 98 - 111 mmol/L  CO2 22 22 - 32 mmol/L   Glucose, Bld 88 70 - 99 mg/dL   BUN 16 8 - 23 mg/dL   Creatinine, Ser 0.77 0.61 - 1.24 mg/dL   Calcium 8.9 8.9 - 10.3 mg/dL   GFR calc non Af Amer >60 >60 mL/min   GFR calc Af Amer >60 >60 mL/min   Anion gap 11 5 - 15    Comment: Performed at [facility] [address], [place], [place] [address]  CBC     Status: Abnormal   Collection Time: 12/16/19  1:20 PM  Result Value Ref Range   WBC 7.9 4.0 - 10.5 K/uL   RBC 2.85 (L) 4.22 - 5.81 MIL/uL   Hemoglobin 8.1 (L) 13.0 - 17.0 g/dL   HCT 26.5 (L) 39.0 - 52.0 %   MCV 93.0 80.0 - 100.0 fL   MCH 28.4 26.0 - 34.0 pg   MCHC 30.6 30.0 - 36.0 g/dL   RDW 24.0 (H) 11.5 - 15.5 %   Platelets 104 (L) 150 - 400 K/uL    Comment: REPEATED TO VERIFY PLATELET COUNT CONFIRMED BY SMEAR SPECIMEN CHECKED FOR CLOTS Immature Platelet Fraction may be clinically indicated, consider ordering this additional test [id number]    nRBC 9.0 (H) 0.0 - 0.2 %    Comment: Performed at [facility], [place] [address] [place], [place] [address]   XR FEMUR MIN 2 VIEWS LEFT  Result Date: 12/15/2019 X-rays demonstrate stable alignment of the fracture with evidence of bony consolidation.  No other acute findings   Review of Systems  Musculoskeletal: Positive for arthralgias.  All other systems reviewed and are negative.   Blood pressure (!) 148/77, pulse 95, temperature 98.6 F (37 C), temperature source Oral, resp. rate 18, height 5\' 8"  (1.727 m), weight 67.1 kg, SpO2 100 %. Physical Exam  Constitutional: He appears well-developed.  HENT:  Head: Normocephalic.  Eyes: Pupils are equal, round, and reactive to light.  Cardiovascular: Normal rate.  Respiratory: Effort normal.  Musculoskeletal:     Cervical back: Normal range of motion.  Neurological: He is alert.  Skin: Skin is warm.  Psychiatric: He has a normal mood and affect.  Examination of the left hip demonstrates pyogenic granulomatous type focal nodular swelling in the middle of the incision.  There is some induration around as well.  Fairly minimal pain with hip range of motion.  Mild warmth around the incision but no definite erythema.  Assessment/Plan Impression is pyogenic type granulomatous lesion in the midportion of the incision with drainage and hematoma beneath.  This could represent infection.  Favor I&D with wound VAC placement.  Risk benefits are discussed with the patient.  Overall the fracture appears to be healing well.  All questions answered.  Anticipate overnight observation with discharge tomorrow.  Anderson Malta, MD 12/16/2019, 4:14 PM

## 2019-12-17 ENCOUNTER — Other Ambulatory Visit: Payer: Self-pay | Admitting: Physician Assistant

## 2019-12-17 ENCOUNTER — Encounter: Payer: Self-pay | Admitting: *Deleted

## 2019-12-17 DIAGNOSIS — L02416 Cutaneous abscess of left lower limb: Secondary | ICD-10-CM | POA: Diagnosis not present

## 2019-12-17 MED ORDER — ASPIRIN EC 81 MG PO TBEC: 81.0000 mg | DELAYED_RELEASE_TABLET | Freq: Two times a day (BID) | ORAL | 0 refills | Status: DC

## 2019-12-17 NOTE — Discharge Summary (Signed)
Patient ID: MANCE BRANSCOMB MRN: [id number] DOB/AGE: December 11, 1940 79 y.o.  Admit date: 12/16/2019 Discharge date: 12/17/2019  Admission Diagnoses:  Active Problems:   Abscess of hip   Discharge Diagnoses:  Same  Past Medical History:  Diagnosis Date  . At risk for sleep apnea    STOP-BANG= 4      . Elevated PSA   . Hypertension   . Infection    left hip  . Nocturia   . Prostate cancer ([place])   . Wears partial dentures     Surgeries: Procedure(s): Left hip incision and drainage on 12/16/2019   Consultants:   Discharged Condition: Improved  Hospital Course: SUSHANTH DIGANGI is an 79 y.o. male who was admitted 12/16/2019 for operative treatment of<principal problem not specified>. Patient has severe unremitting pain that affects sleep, daily activities, and work/hobbies. After pre-op clearance the patient was taken to the operating room on 12/16/2019 and underwent  Procedure(s): Left hip incision and drainage.    Patient was given perioperative antibiotics:  Anti-infectives (From admission, onward)   Start     Dose/Rate Route Frequency Ordered Stop   12/17/19 0600  ceFAZolin (ANCEF) IVPB 2g/100 mL premix     2 g 200 mL/hr over 30 Minutes Intravenous On call to O.R. 12/16/19 1245 12/16/19 1733   12/16/19 2030  ceFAZolin (ANCEF) IVPB 2g/100 mL premix     2 g 200 mL/hr over 30 Minutes Intravenous Every 6 hours 12/16/19 2025 12/17/19 1429   12/16/19 1727  vancomycin (VANCOCIN) powder  Status:  Discontinued       As needed 12/16/19 1727 12/16/19 1742   12/16/19 1252  ceFAZolin (ANCEF) 2-4 GM/100ML-% IVPB    Note to Pharmacy: Odelia Gage   : cabinet override      12/16/19 1252 12/16/19 1704       Patient was given sequential compression devices, early ambulation, and chemoprophylaxis to prevent DVT.  Patient benefited maximally from hospital stay and there were no complications.    Recent vital signs:  Patient Vitals for the past 24 hrs:  BP Temp Temp src Pulse Resp SpO2 Height  Weight  12/17/19 0757 117/65 98.6 F (37 C) Oral (!) 101 17 100 % -- --  12/17/19 0340 121/66 98.4 F (36.9 C) Oral 85 17 100 % -- --  12/16/19 2014 136/71 98.1 F (36.7 C) Oral 88 19 97 % -- --  12/16/19 1934 140/76 -- -- (!) 101 (!) 25 97 % -- --  12/16/19 1922 -- -- -- -- -- 98 % -- --  12/16/19 1916 139/85 -- -- 91 12 97 % -- --  12/16/19 1901 (!) 143/88 -- -- 87 12 98 % -- --  12/16/19 1831 (!) 154/96 -- -- 88 13 95 % -- --  12/16/19 1816 (!) 161/90 -- -- 84 18 100 % -- --  12/16/19 1801 (!) 156/93 -- -- 92 (!) 27 100 % -- --  12/16/19 1746 138/87 (!) 97 F (36.1 C) -- 86 18 100 % -- --  12/16/19 1244 (!) 148/77 98.6 F (37 C) Oral 95 18 100 % 5\' 8"  (1.727 m) 67.1 kg     Recent laboratory studies:  Recent Labs    12/16/19 1320  WBC 7.9  HGB 8.1*  HCT 26.5*  PLT 104*  NA 138  K 4.0  CL 105  CO2 22  BUN 16  CREATININE 0.77  GLUCOSE 88  CALCIUM 8.9     Discharge Medications:   Allergies as of  12/17/2019   No Known Allergies     Medication List    STOP taking these medications   fentaNYL 25 MCG/HR Commonly known as: DURAGESIC   HYDROcodone-acetaminophen 5-325 MG tablet Commonly known as: Norco   ondansetron 4 MG disintegrating tablet Commonly known as: Zofran ODT   oxyCODONE 5 MG immediate release tablet Commonly known as: Oxy IR/ROXICODONE   traMADol 50 MG tablet Commonly known as: ULTRAM     TAKE these medications   alendronate 70 MG tablet Commonly known as: FOSAMAX Take 70 mg by mouth once a week.   aspirin EC 81 MG tablet Take 1 tablet (81 mg total) by mouth 2 (two) times daily.   calcium-vitamin D 500-200 MG-UNIT tablet Commonly known as: OSCAL WITH D Take 1 tablet by mouth 3 (three) times daily.   cephALEXin 500 MG capsule Commonly known as: Keflex Take 1 capsule (500 mg total) by mouth 4 (four) times daily.   lisinopril 10 MG tablet Commonly known as: ZESTRIL Take 1 tablet by mouth every evening.   methocarbamol 750 MG  tablet Commonly known as: ROBAXIN Take 1 tablet (750 mg total) by mouth 2 (two) times daily as needed for muscle spasms.   oxyCODONE-acetaminophen 5-325 MG tablet Commonly known as: Percocet Take 1-2 tablets by mouth 3 (three) times daily as needed for severe pain.   vitamin B-12 1000 MCG tablet Commonly known as: CYANOCOBALAMIN Take 1,000 mcg by mouth daily.   Vitamin D3 25 MCG (1000 UT) Caps Take 1 capsule by mouth daily.   zinc sulfate 220 (50 Zn) MG capsule Take 1 capsule (220 mg total) by mouth daily.       Diagnostic Studies: XR FEMUR MIN 2 VIEWS LEFT  Result Date: 12/15/2019 X-rays demonstrate stable alignment of the fracture with evidence of bony consolidation.  No other acute findings  XR FEMUR MIN 2 VIEWS LEFT  Result Date: 12/07/2019 Stable fixation basicervical intertrochanteric fracture without hardware complication.   Disposition: Discharge disposition: 01-Home or Self Care         Follow-up Information    Leandrew Koyanagi, MD. Call on 12/21/2019.   Specialty: Orthopedic Surgery Contact information: [address] [place] [address] [phone]            Signed: [name] 12/17/2019, 8:39 AM

## 2019-12-17 NOTE — Plan of Care (Signed)

## 2019-12-17 NOTE — Discharge Instructions (Signed)
Weight bearing as tolerated Do not remove wound vac Continue keflex  F/u with Dr. Erlinda Hong this coming Tuesday (please call to make appointment)

## 2019-12-17 NOTE — Progress Notes (Signed)
RN reviewed AVS with pt and switched over to a prevena wound vac pump for home use; RN educated pt on wound vac and all questions answered to satisfaction. IV removed and pt dressed and belongings gathered. Pt called daughter for ride home and is currently waiting on her. Will continue to monitor.

## 2019-12-17 NOTE — TOC Transition Note (Signed)
Transition of Care [facility]) - CM/SW Discharge Note   Patient Details  Name: Austin Oconnor MRN: [id number] Date of Birth: Jan 04, 1941  [facility]) CM/SW Contact:  Atilano Median, LCSW Phone Number: 12/17/2019, 11:18 AM   Clinical Narrative:     Discharged home with home health services. Patient aware and agreeable to this plan. No other needs at this time. Case closed to this CSW.   Final next level of care: Home w Home Health Services Barriers to Discharge: Barriers Resolved   Patient Goals and CMS Choice   CMS Medicare.gov Compare Post Acute Care list provided to:: Patient Choice offered to / list presented to : Patient  Discharge Placement                       Discharge Plan and Services                          HH Arranged: PT [facility] Agency: [facility] (now [facility]) Date [place]: 12/17/19 Time [place]: 1118 Representative spoke with at [place]: [place] ([place]) Interventions     Readmission Risk Interventions No flowsheet data found.

## 2019-12-17 NOTE — Progress Notes (Signed)
Subjective: 1 Day Post-Op Procedure(s) (LRB): Left hip incision and drainage (Left) Patient reports pain as mild.    Objective: Vital signs in last 24 hours: Temp:  [97 F (36.1 C)-98.6 F (37 C)] 98.6 F (37 C) (02/05 0757) Pulse Rate:  [84-101] 101 (02/05 0757) Resp:  [12-27] 17 (02/05 0757) BP: (117-161)/(65-96) 117/65 (02/05 0757) SpO2:  [95 %-100 %] 100 % (02/05 0757) Weight:  [67.1 kg] 67.1 kg (02/04 1244)  Intake/Output from previous day: 02/04 0701 - 02/05 0700 In: 1080 [P.O.:480; I.V.:500; IV Piggyback:100] Out: 1250 [Urine:1100; Drains:100; Blood:50] Intake/Output this shift: No intake/output data recorded.  Recent Labs    12/16/19 1320  HGB 8.1*   Recent Labs    12/16/19 1320  WBC 7.9  RBC 2.85*  HCT 26.5*  PLT 104*   Recent Labs    12/16/19 1320  NA 138  K 4.0  CL 105  CO2 22  BUN 16  CREATININE 0.77  GLUCOSE 88  CALCIUM 8.9   No results for input(s): LABPT, INR in the last 72 hours.  Neurologically intact Neurovascular intact Sensation intact distally Intact pulses distally Dorsiflexion/Plantar flexion intact Incision: dressing C/D/I No cellulitis present Compartment soft Wound vac functioning properly- 100cc blood in canister  Assessment/Plan: 1 Day Post-Op Procedure(s) (LRB): Left hip incision and drainage (Left) Up with therapy WBAT LLE Switch incisional vac to prevena prior to d/c today  Continue Keflex at d/c F/u with Dr. Erlinda Hong on Tuesday     Austin Oconnor 12/17/2019, 8:39 AM

## 2019-12-20 ENCOUNTER — Telehealth: Payer: Self-pay | Admitting: Orthopaedic Surgery

## 2019-12-20 ENCOUNTER — Other Ambulatory Visit: Payer: Self-pay | Admitting: Physician Assistant

## 2019-12-20 ENCOUNTER — Ambulatory Visit: Payer: Medicare Other

## 2019-12-20 MED ORDER — OXYCODONE-ACETAMINOPHEN 5-325 MG PO TABS: 1.0000 | ORAL_TABLET | Freq: Three times a day (TID) | ORAL | 0 refills | Status: DC | PRN

## 2019-12-20 NOTE — Telephone Encounter (Signed)
Erlinda Hong, him to come in tomorrow?  I told him Friday we needed to see him Tuesday.  He has a prevena

## 2019-12-20 NOTE — Telephone Encounter (Signed)
Pt called requesting a refill of oxycodone-acetaminophen be sent to his [facility] on [address].   [phone]

## 2019-12-20 NOTE — Telephone Encounter (Signed)
Rx called into pharm. Called daughter no answer.

## 2019-12-20 NOTE — Telephone Encounter (Signed)
I called patients daughter no answer.

## 2019-12-20 NOTE — Telephone Encounter (Signed)
I just sent in.  Will you make sure that he is coming in to see [place] tomorrow?

## 2019-12-20 NOTE — Telephone Encounter (Signed)
Patient's daughter Shelton Silvas called for updates about medications refill being called in. Please give daughter a call once prescription has been called in. Patient's daughter phone number is [phone].

## 2019-12-20 NOTE — Telephone Encounter (Signed)
Called Verdis Frederickson to approve orders.

## 2019-12-20 NOTE — Telephone Encounter (Signed)
Patient's daughter called.  He is requesting a refill on his oxyCODONE   Call back number: [phone]

## 2019-12-20 NOTE — Telephone Encounter (Signed)
Called Daughter multiple times no answer. LMOM.   Need to make appt with Dr Erlinda Hong tomorrow 12/21/2019

## 2019-12-20 NOTE — Telephone Encounter (Signed)
Laurey Arrow from [facility] called requesting verbal orders for patient of Dr. Erlinda Hong. Verbal orders as follow: Physical therapy.  3 x a week for 2 weeks. 2x a week for 6 weeks. Maria phone number is [phone] [facility]. Leave detailed message if any questions

## 2019-12-20 NOTE — Telephone Encounter (Signed)
Tomorrow? He has appt Thursday 2/11

## 2019-12-21 ENCOUNTER — Other Ambulatory Visit: Payer: Self-pay

## 2019-12-21 ENCOUNTER — Encounter: Payer: Self-pay | Admitting: Orthopaedic Surgery

## 2019-12-21 ENCOUNTER — Ambulatory Visit: Payer: Medicare Other

## 2019-12-21 ENCOUNTER — Ambulatory Visit (INDEPENDENT_AMBULATORY_CARE_PROVIDER_SITE_OTHER): Payer: Medicare Other | Admitting: Orthopaedic Surgery

## 2019-12-21 DIAGNOSIS — L02419 Cutaneous abscess of limb, unspecified: Secondary | ICD-10-CM

## 2019-12-21 DIAGNOSIS — M84452A Pathological fracture, left femur, initial encounter for fracture: Secondary | ICD-10-CM

## 2019-12-21 LAB — ANAEROBIC AND AEROBIC CULTURE
AER RESULT:: NO GROWTH
MICRO NUMBER:: 10111574
MICRO NUMBER:: 10111575
SPECIMEN QUALITY:: ADEQUATE
SPECIMEN QUALITY:: ADEQUATE

## 2019-12-21 LAB — AEROBIC/ANAEROBIC CULTURE W GRAM STAIN (SURGICAL/DEEP WOUND): Culture: NO GROWTH

## 2019-12-21 MED ORDER — CEPHALEXIN 500 MG PO CAPS: 500.0000 mg | ORAL_CAPSULE | Freq: Four times a day (QID) | ORAL | 0 refills | Status: DC

## 2019-12-21 MED ORDER — IBUPROFEN 800 MG PO TABS: 800.0000 mg | ORAL_TABLET | Freq: Three times a day (TID) | ORAL | 0 refills | Status: DC | PRN

## 2019-12-21 NOTE — Progress Notes (Signed)
Post-Op Visit Note   Patient: Austin Oconnor           Date of Birth: [birth year]           MRN: [id number] Visit Date: 12/21/2019 PCP: [name], MD   Assessment & Plan:  Chief Complaint:  Chief Complaint  Patient presents with  . Left Hip - Pain   Visit Diagnoses:  1. Abscess of hip   2. Pathologic intertrochanteric fracture, left, initial encounter [facility])     Plan: Patient is a pleasant 79 year old gentleman who comes in today approximately 5 days out left hip irrigation and debridement from superficial wound abscess, date of surgery 12/16/2019.  This was by Dr. Marlou Sa.  Initial surgery was a left hip intramedullary nail from pathologic intertrochanteric femur fracture approximately 8 weeks ago.  Intraoperative cultures from the most recent surgery have been negative to date.  He has been on Keflex.  His pain has improved although the still notes that it is moderate at times.  He has been taking oxycodone for this.  No fevers or chills.  He has been wearing a Prevena wound Vac.  Examination of his left hip reveals a well healing surgical incision with 3 nylon sutures in place.  No tenderness or drainage.  Calf is soft and nontender.  He is neurovascularly intact distally.  At this point, we have removed his wound VAC and applied an Aquacel dressing.  We will continue to weight-bear as tolerated.  Continue working with home health physical therapy.  I have refilled his Keflex for another week.  He will follow-up with [place] in 1 week's time for likely suture removal.  Call with concerns or questions in the meantime.  Follow-Up Instructions: Return in about 1 week (around 12/28/2019).   Orders:  No orders of the defined types were placed in this encounter.  Meds ordered this encounter  Medications  . cephALEXin (KEFLEX) 500 MG capsule    Sig: Take 1 capsule (500 mg total) by mouth 4 (four) times daily.    Dispense:  28 capsule    Refill:  0  . ibuprofen (ADVIL) 800 MG tablet   Sig: Take 1 tablet (800 mg total) by mouth every 8 (eight) hours as needed.    Dispense:  30 tablet    Refill:  0    Imaging: No results found.  PMFS History: Patient Active Problem List   Diagnosis Date Noted  . Abscess of hip 12/16/2019  . Pancytopenia ([place]) 10/21/2019  . Cardiomegaly 10/21/2019  . Pathologic intertrochanteric fracture, left, initial encounter ([place]) 10/20/2019  . Essential hypertension 10/20/2019  . Goals of care, counseling/discussion 08/16/2019  . Malignant neoplasm of prostate ([place]) 02/05/2016  . Bone metastasis ([place]) 02/05/2016   Past Medical History:  Diagnosis Date  . At risk for sleep apnea    STOP-BANG= 4      . Elevated PSA   . Hypertension   . Infection    left hip  . Nocturia   . Prostate cancer ([place])   . Wears partial dentures     Family History  Problem Relation Age of Onset  . Prostate cancer Brother   . Cancer Neg Hx   . Breast cancer Neg Hx   . Colon cancer Neg Hx   . Pancreatic cancer Neg Hx     Past Surgical History:  Procedure Laterality Date  . I & D EXTREMITY Left 12/16/2019   Procedure: Left hip incision and drainage;  Surgeon: Meredith Pel,  MD;  Location: [place];  Service: Orthopedics;  Laterality: Left;  . INGUINAL HERNIA REPAIR Bilateral 1990  &  1960s  . INTRAMEDULLARY (IM) NAIL INTERTROCHANTERIC Left 10/21/2019   Procedure: LEFT INTERTROCHANTERIC INTRAMEDULLARY (IM) NAIL;  Surgeon: Leandrew Koyanagi, MD;  Location: [facility];  Service: Orthopedics;  Laterality: Left;  . LUMBAR SPINE SURGERY  age 34's  . PROSTATE BIOPSY N/A 09/16/2014   Procedure: BIOPSY TRANSRECTAL ULTRASONIC PROSTATE (TUBP);  Surgeon: Arvil Persons, MD;  Location: [facility];  Service: Urology;  Laterality: N/A;   Social History   Occupational History  . Not on file  Tobacco Use  . Smoking status: Never Smoker  . Smokeless tobacco: Never Used  Substance and Sexual Activity  . Alcohol use: No  . Drug use: No  . Sexual activity: Not  Currently

## 2019-12-21 NOTE — Telephone Encounter (Signed)
Appt made

## 2019-12-22 ENCOUNTER — Ambulatory Visit: Payer: Medicare Other

## 2019-12-23 ENCOUNTER — Ambulatory Visit: Payer: Medicare Other

## 2019-12-23 ENCOUNTER — Inpatient Hospital Stay: Payer: Medicare Other | Admitting: Orthopaedic Surgery

## 2019-12-24 ENCOUNTER — Ambulatory Visit: Payer: Medicare Other

## 2019-12-27 ENCOUNTER — Ambulatory Visit: Payer: Medicare Other

## 2019-12-27 NOTE — Anesthesia Postprocedure Evaluation (Signed)
Anesthesia Post Note  Patient: Austin Oconnor  Procedure(s) Performed: Left hip incision and drainage (Left )     Patient location during evaluation: PACU Anesthesia Type: General Level of consciousness: awake and alert Pain management: pain level controlled Vital Signs Assessment: post-procedure vital signs reviewed and stable Respiratory status: spontaneous breathing, nonlabored ventilation, respiratory function stable and patient connected to nasal cannula oxygen Cardiovascular status: blood pressure returned to baseline and stable Postop Assessment: no apparent nausea or vomiting Anesthetic complications: no    Last Vitals:  Vitals:   12/17/19 0340 12/17/19 0757  BP: 121/66 117/65  Pulse: 85 (!) 101  Resp: 17 17  Temp: 36.9 C 37 C  SpO2: 100% 100%    Last Pain:  Vitals:   12/17/19 0912  TempSrc:   PainSc: [place]

## 2019-12-28 ENCOUNTER — Ambulatory Visit (INDEPENDENT_AMBULATORY_CARE_PROVIDER_SITE_OTHER): Payer: Medicare Other

## 2019-12-28 ENCOUNTER — Ambulatory Visit (INDEPENDENT_AMBULATORY_CARE_PROVIDER_SITE_OTHER): Payer: Medicare Other | Admitting: Orthopaedic Surgery

## 2019-12-28 ENCOUNTER — Encounter: Payer: Self-pay | Admitting: Orthopaedic Surgery

## 2019-12-28 ENCOUNTER — Other Ambulatory Visit: Payer: Self-pay

## 2019-12-28 ENCOUNTER — Ambulatory Visit: Payer: Medicare Other

## 2019-12-28 DIAGNOSIS — M25552 Pain in left hip: Secondary | ICD-10-CM

## 2019-12-28 DIAGNOSIS — M84452A Pathological fracture, left femur, initial encounter for fracture: Secondary | ICD-10-CM

## 2019-12-28 MED ORDER — CEPHALEXIN 500 MG PO CAPS: 500.0000 mg | ORAL_CAPSULE | Freq: Four times a day (QID) | ORAL | 0 refills | Status: DC

## 2019-12-28 MED ORDER — OXYCODONE-ACETAMINOPHEN 5-325 MG PO TABS: 1.0000 | ORAL_TABLET | Freq: Three times a day (TID) | ORAL | 0 refills | Status: DC | PRN

## 2019-12-28 NOTE — Progress Notes (Signed)
Post-Op Visit Note   Patient: Austin Oconnor           Date of Birth: 05-08-1941           MRN: [id number] Visit Date: 12/28/2019 PCP: [name], MD   Assessment & Plan:  Chief Complaint:  Chief Complaint  Patient presents with  . Left Hip - Pain   Visit Diagnoses:  1. Pathologic intertrochanteric fracture, left, initial encounter ([place])   2. Pain in left hip     Plan: Patient is a pleasant 79 year old gentleman who comes in today 12 days status post left hip irrigation debridement from a superficial wound abscess by Dr. Marlou Sa, date of surgery 12/16/2019.  He is also approximately 9 weeks status post left hip IM nail from a pathologic intertrochanteric femur fracture.  He has been complaining of moderate pain to the left hip.  He still continues to have slight bloody drainage to the left hip wound.  No fevers or chills.  His cultures from the office and intraoperatively are still negative to date.  He has been taking oxycodone and Keflex as prescribed.  He has been getting home health physical therapy.  Examination of his left hip reveals a well-healing surgical incision with 3 nylon sutures in place.  He has dried bloody drainage to his bandage.  Moderate swelling throughout.  He is neurovascularly intact distally.  Today, a new bandage was applied.  We will refill his Keflex for another week.  He will follow-up with [place] in 1 week's time for repeat evaluation and 2 view x-rays of the left femur as well as the anticipation of suture removal.  In the meantime, he will continue to ice and elevate.  Continue with home health physical therapy.  Our hopes are that he can start radiation therapy after his visit with [place] next week following suture removal.  He will call with any concerns or questions in the meantime.  Follow-Up Instructions: Return in about 1 week (around 01/04/2020).   Orders:  Orders Placed This Encounter  Procedures  . XR FEMUR MIN 2 VIEWS LEFT   Meds ordered this  encounter  Medications  . cephALEXin (KEFLEX) 500 MG capsule    Sig: Take 1 capsule (500 mg total) by mouth 4 (four) times daily.    Dispense:  28 capsule    Refill:  0  . oxyCODONE-acetaminophen (PERCOCET) 5-325 MG tablet    Sig: Take 1-2 tablets by mouth 3 (three) times daily as needed for severe pain.    Dispense:  20 tablet    Refill:  0    Imaging: XR FEMUR MIN 2 VIEWS LEFT  Result Date: 12/28/2019 Stable alignment of the fracture without hardware complication.   PMFS History: Patient Active Problem List   Diagnosis Date Noted  . Abscess of hip 12/16/2019  . Pancytopenia ([place]) 10/21/2019  . Cardiomegaly 10/21/2019  . Pathologic intertrochanteric fracture, left, initial encounter ([place]) 10/20/2019  . Essential hypertension 10/20/2019  . Goals of care, counseling/discussion 08/16/2019  . Malignant neoplasm of prostate ([place]) 02/05/2016  . 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 At risk for sleep apnea    STOP-BANG= 4      . Elevated PSA   . Hypertension   . Infection    left hip  . Nocturia   . Prostate cancer ([place])   . Wears partial dentures     Family History  Problem Relation Age of Onset  . Prostate  cancer Brother   . Cancer Neg Hx   . Breast cancer Neg Hx   . Colon cancer Neg Hx   . Pancreatic cancer Neg Hx     Past Surgical History:  Procedure Laterality Date  . I & D EXTREMITY Left 12/16/2019   Procedure: Left hip incision and drainage;  Surgeon: Meredith Pel, MD;  Location: [facility];  Service: Orthopedics;  Laterality: Left;  . INGUINAL HERNIA REPAIR Bilateral 1990  &  1960s  . INTRAMEDULLARY (IM) NAIL INTERTROCHANTERIC Left 10/21/2019   Procedure: LEFT INTERTROCHANTERIC INTRAMEDULLARY (IM) NAIL;  Surgeon: Leandrew Koyanagi, MD;  Location: [facility];  Service: Orthopedics;  Laterality: Left;  . LUMBAR SPINE SURGERY  age 56's  . PROSTATE BIOPSY N/A 09/16/2014   Procedure: BIOPSY TRANSRECTAL ULTRASONIC PROSTATE (TUBP);   Surgeon: Arvil Persons, MD;  Location: [facility];  Service: Urology;  Laterality: N/A;   Social History   Occupational History  . Not on file  Tobacco Use  . Smoking status: Never Smoker  . Smokeless tobacco: Never Used  Substance and Sexual Activity  . Alcohol use: No  . Drug use: No  . Sexual activity: Not Currently

## 2019-12-29 ENCOUNTER — Ambulatory Visit: Payer: Medicare Other

## 2019-12-30 ENCOUNTER — Ambulatory Visit: Payer: Medicare Other

## 2019-12-31 ENCOUNTER — Ambulatory Visit: Payer: Medicare Other

## 2020-01-04 ENCOUNTER — Ambulatory Visit: Payer: Medicare Other

## 2020-01-04 ENCOUNTER — Telehealth: Payer: Self-pay

## 2020-01-04 ENCOUNTER — Other Ambulatory Visit: Payer: Self-pay

## 2020-01-04 ENCOUNTER — Ambulatory Visit (INDEPENDENT_AMBULATORY_CARE_PROVIDER_SITE_OTHER): Payer: Medicare Other

## 2020-01-04 ENCOUNTER — Ambulatory Visit (INDEPENDENT_AMBULATORY_CARE_PROVIDER_SITE_OTHER): Payer: Medicare Other | Admitting: Orthopaedic Surgery

## 2020-01-04 ENCOUNTER — Telehealth: Payer: Self-pay | Admitting: Radiation Oncology

## 2020-01-04 ENCOUNTER — Encounter: Payer: Self-pay | Admitting: Orthopaedic Surgery

## 2020-01-04 VITALS — Ht 68.0 in | Wt 148.0 lb

## 2020-01-04 DIAGNOSIS — M84452A Pathological fracture, left femur, initial encounter for fracture: Secondary | ICD-10-CM | POA: Diagnosis not present

## 2020-01-04 MED ORDER — CEPHALEXIN 500 MG PO CAPS: 500.0000 mg | ORAL_CAPSULE | Freq: Four times a day (QID) | ORAL | 0 refills | Status: DC

## 2020-01-04 MED ORDER — OXYCODONE-ACETAMINOPHEN 5-325 MG PO TABS: 1.0000 | ORAL_TABLET | Freq: Every day | ORAL | 0 refills | Status: DC | PRN

## 2020-01-04 NOTE — Addendum Note (Signed)
Addended by: Azucena Cecil on: 01/04/2020 12:22 PM   Modules accepted: Orders

## 2020-01-04 NOTE — Telephone Encounter (Signed)
Patient forgot to ask about pain meds at his visit today. Would like pain meds sent to his pharm.

## 2020-01-04 NOTE — Telephone Encounter (Addendum)
Noted patient did not return today to resume left hip radiation therapy as planned. Phoned patient to inquire. No answer. Left voicemail message with my direct number requesting a return call. Informed RT on L4 of this finding.

## 2020-01-04 NOTE — Addendum Note (Signed)
Addended by: [name] on: 01/04/2020 05:06 PM   Modules accepted: Orders

## 2020-01-04 NOTE — Progress Notes (Addendum)
Post-Op Visit Note   Patient: Austin Oconnor           Date of Birth: 1940/12/14           MRN: [id number] Visit Date: 01/04/2020 PCP: [name], MD   Assessment & Plan:  Chief Complaint:  Chief Complaint  Patient presents with  . Left Hip - Follow-up    Left nailing of intertroch fracture DOS 10/21/2019   Visit Diagnoses:  1. Pathologic intertrochanteric fracture, left, initial encounter [facility])     Plan: Mr. Schuelke returns today for follow-up and removal of his surgical hip sutures.  No changes in medical history.  Surgical incision is healed with moderate swelling.  There is some dried bloody drainage on the bandage.  No signs of infection.  Intraoperative cultures were negative.  We remove the sutures today and place Steri-Strips.  Percocet refilled today.  He may begin radiation treatment at this time.  Follow-up in 1 month for repeat 2 view x-rays of the left hip.  ADDENDUM: Patient comes in this afternoon with his daughter for concerns about his left hip.  Sutures were removed this morning in our office.  He started to notice slight drainage to the incision.  Examination of the left hip reveals mild bloody drainage with slight wound dehiscence to the very proximal and distal aspects.  He does have significant left lower extremity edema as well.  At this point, we will apply a wet-to-dry dressing.  We will have him do this twice daily.  His daughter has been instructed on this.  We will also refill his Keflex for another 2 weeks.  Follow-up with [place] at that point for repeat evaluation and 2 view x-rays of the left hip.  Call with concerns or questions in meantime.  Follow-Up Instructions: Return in about 2 weeks (around 01/18/2020).   Orders:  Orders Placed This Encounter  Procedures  . XR FEMUR MIN 2 VIEWS LEFT   Meds ordered this encounter  Medications  . cephALEXin (KEFLEX) 500 MG capsule    Sig: Take 1 capsule (500 mg total) by mouth 4 (four) times daily.   Dispense:  56 capsule    Refill:  0    Imaging: No results found.  PMFS History: Patient Active Problem List   Diagnosis Date Noted  . Abscess of hip 12/16/2019  . Pancytopenia ([place]) 10/21/2019  . Cardiomegaly 10/21/2019  . Pathologic intertrochanteric fracture, left, initial encounter ([place]) 10/20/2019  . Essential hypertension 10/20/2019  . Goals of care, counseling/discussion 08/16/2019  . Malignant neoplasm of prostate ([place]) 02/05/2016  . 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 At risk for sleep apnea    STOP-BANG= 4      . Elevated PSA   . Hypertension   . Infection    left hip  . Nocturia   . Prostate cancer ([place])   . Wears partial dentures     Family History  Problem Relation Age of Onset  . Prostate cancer Brother   . Cancer Neg Hx   . Breast cancer Neg Hx   . Colon cancer Neg Hx   . Pancreatic cancer Neg Hx     Past Surgical History:  Procedure Laterality Date  . I & D EXTREMITY Left 12/16/2019   Procedure: Left hip incision and drainage;  Surgeon: Meredith Pel, MD;  Location: [facility];  Service: Orthopedics;  Laterality: Left;  . INGUINAL HERNIA REPAIR Bilateral 1990  &  1960s  . INTRAMEDULLARY (IM) NAIL INTERTROCHANTERIC Left 10/21/2019   Procedure: LEFT INTERTROCHANTERIC INTRAMEDULLARY (IM) NAIL;  Surgeon: Leandrew Koyanagi, MD;  Location: [place];  Service: Orthopedics;  Laterality: Left;  . LUMBAR SPINE SURGERY  age 61's  . PROSTATE BIOPSY N/A 09/16/2014   Procedure: BIOPSY TRANSRECTAL ULTRASONIC PROSTATE (TUBP);  Surgeon: Arvil Persons, MD;  Location: [facility];  Service: Urology;  Laterality: N/A;   Social History   Occupational History  . Not on file  Tobacco Use  . Smoking status: Never Smoker  . Smokeless tobacco: Never Used  Substance and Sexual Activity  . Alcohol use: No  . Drug use: No  . Sexual activity: Not Currently

## 2020-01-05 ENCOUNTER — Ambulatory Visit: Payer: Medicare Other

## 2020-01-06 ENCOUNTER — Telehealth: Payer: Self-pay | Admitting: Radiation Oncology

## 2020-01-06 ENCOUNTER — Ambulatory Visit: Payer: Medicare Other

## 2020-01-06 NOTE — Telephone Encounter (Signed)
Patient scheduled to resume radiation therapy following post op visit with Dr. Erlinda Hong. Patient did not shown for treatment. Phoned patient today to inquire. Patient reports the pain in his left leg is such that he doesn't believe he can lay on the treatment table.  Discussed scheduling oxycodone-acetaminophen such that he can take it 30 minutes prior to treatment for better tolerance while laying on treatment table. Then, patient reports his concern "that the wound is open and bleeding." Patient goes onto explain he has already had to change his dressing twice this morning. Also, patient reports he would need his daughter to bring him for treatment to assist him because he can't get around without help. Patient confirms a physical therapist is working with him 2-3 time per week. Patient confirms he does want radiation therapy but doesn't believe he can do it right now. Explained to the patient this RN would inform the providers of these findings and phone back with further direction. Informed treatment machine the patient will not be present for treatment today.

## 2020-01-07 ENCOUNTER — Ambulatory Visit: Payer: Medicare Other

## 2020-01-09 ENCOUNTER — Other Ambulatory Visit: Payer: Self-pay | Admitting: Physician Assistant

## 2020-01-10 ENCOUNTER — Telehealth: Payer: Self-pay | Admitting: Radiology

## 2020-01-10 ENCOUNTER — Inpatient Hospital Stay (HOSPITAL_COMMUNITY)
Admission: EM | Admit: 2020-01-10 | Discharge: 2020-01-18 | DRG: 291 | Disposition: A | Discharge status: Hospice | Payer: Medicare Other | Attending: Internal Medicine | Admitting: Internal Medicine

## 2020-01-10 ENCOUNTER — Other Ambulatory Visit: Payer: Self-pay

## 2020-01-10 ENCOUNTER — Emergency Department (HOSPITAL_COMMUNITY): Payer: Medicare Other

## 2020-01-10 ENCOUNTER — Ambulatory Visit: Payer: Medicare Other

## 2020-01-10 DIAGNOSIS — I5033 Acute on chronic diastolic (congestive) heart failure: Secondary | ICD-10-CM | POA: Diagnosis present

## 2020-01-10 DIAGNOSIS — L02419 Cutaneous abscess of limb, unspecified: Secondary | ICD-10-CM | POA: Diagnosis present

## 2020-01-10 DIAGNOSIS — I110 Hypertensive heart disease with heart failure: Secondary | ICD-10-CM | POA: Diagnosis present

## 2020-01-10 DIAGNOSIS — K5900 Constipation, unspecified: Secondary | ICD-10-CM | POA: Diagnosis not present

## 2020-01-10 DIAGNOSIS — G893 Neoplasm related pain (acute) (chronic): Secondary | ICD-10-CM | POA: Diagnosis not present

## 2020-01-10 DIAGNOSIS — C7951 Secondary malignant neoplasm of bone: Secondary | ICD-10-CM | POA: Diagnosis present

## 2020-01-10 DIAGNOSIS — Z6824 Body mass index (BMI) 24.0-24.9, adult: Secondary | ICD-10-CM

## 2020-01-10 DIAGNOSIS — Z23 Encounter for immunization: Secondary | ICD-10-CM

## 2020-01-10 DIAGNOSIS — C61 Malignant neoplasm of prostate: Secondary | ICD-10-CM | POA: Diagnosis not present

## 2020-01-10 DIAGNOSIS — Z7983 Long term (current) use of bisphosphonates: Secondary | ICD-10-CM

## 2020-01-10 DIAGNOSIS — D649 Anemia, unspecified: Secondary | ICD-10-CM

## 2020-01-10 DIAGNOSIS — R627 Adult failure to thrive: Secondary | ICD-10-CM | POA: Diagnosis present

## 2020-01-10 DIAGNOSIS — R7401 Elevation of levels of liver transaminase levels: Secondary | ICD-10-CM | POA: Diagnosis present

## 2020-01-10 DIAGNOSIS — C787 Secondary malignant neoplasm of liver and intrahepatic bile duct: Secondary | ICD-10-CM | POA: Diagnosis present

## 2020-01-10 DIAGNOSIS — D696 Thrombocytopenia, unspecified: Secondary | ICD-10-CM | POA: Diagnosis present

## 2020-01-10 DIAGNOSIS — E43 Unspecified severe protein-calorie malnutrition: Secondary | ICD-10-CM | POA: Diagnosis present

## 2020-01-10 DIAGNOSIS — Z515 Encounter for palliative care: Secondary | ICD-10-CM

## 2020-01-10 DIAGNOSIS — D638 Anemia in other chronic diseases classified elsewhere: Secondary | ICD-10-CM | POA: Diagnosis present

## 2020-01-10 DIAGNOSIS — R64 Cachexia: Secondary | ICD-10-CM | POA: Diagnosis present

## 2020-01-10 DIAGNOSIS — Z8042 Family history of malignant neoplasm of prostate: Secondary | ICD-10-CM

## 2020-01-10 DIAGNOSIS — R109 Unspecified abdominal pain: Secondary | ICD-10-CM

## 2020-01-10 DIAGNOSIS — R609 Edema, unspecified: Secondary | ICD-10-CM | POA: Diagnosis not present

## 2020-01-10 DIAGNOSIS — M84452A Pathological fracture, left femur, initial encounter for fracture: Secondary | ICD-10-CM

## 2020-01-10 DIAGNOSIS — D630 Anemia in neoplastic disease: Secondary | ICD-10-CM | POA: Diagnosis present

## 2020-01-10 DIAGNOSIS — Z7189 Other specified counseling: Secondary | ICD-10-CM | POA: Diagnosis not present

## 2020-01-10 DIAGNOSIS — Z20822 Contact with and (suspected) exposure to covid-19: Secondary | ICD-10-CM | POA: Diagnosis present

## 2020-01-10 DIAGNOSIS — Z7982 Long term (current) use of aspirin: Secondary | ICD-10-CM

## 2020-01-10 DIAGNOSIS — I10 Essential (primary) hypertension: Secondary | ICD-10-CM | POA: Diagnosis present

## 2020-01-10 DIAGNOSIS — D6959 Other secondary thrombocytopenia: Secondary | ICD-10-CM | POA: Diagnosis present

## 2020-01-10 DIAGNOSIS — M7989 Other specified soft tissue disorders: Secondary | ICD-10-CM | POA: Diagnosis present

## 2020-01-10 HISTORY — DX: Heart failure, unspecified: I50.9

## 2020-01-10 LAB — CBC
HCT: 20.5 % — ABNORMAL LOW (ref 39.0–52.0)
Hemoglobin: 6.2 g/dL — CL (ref 13.0–17.0)
MCH: 29.2 pg (ref 26.0–34.0)
MCHC: 30.2 g/dL (ref 30.0–36.0)
MCV: 96.7 fL (ref 80.0–100.0)
Platelets: 63 10*3/uL — ABNORMAL LOW (ref 150–400)
RBC: 2.12 MIL/uL — ABNORMAL LOW (ref 4.22–5.81)
RDW: 25.6 % — ABNORMAL HIGH (ref 11.5–15.5)
WBC: 12 10*3/uL — ABNORMAL HIGH (ref 4.0–10.5)
nRBC: 24 % — ABNORMAL HIGH (ref 0.0–0.2)

## 2020-01-10 LAB — COMPREHENSIVE METABOLIC PANEL
ALT: 65 U/L — ABNORMAL HIGH (ref 0–44)
AST: 231 U/L — ABNORMAL HIGH (ref 15–41)
Albumin: 2.3 g/dL — ABNORMAL LOW (ref 3.5–5.0)
Alkaline Phosphatase: 961 U/L — ABNORMAL HIGH (ref 38–126)
Anion gap: 11 (ref 5–15)
BUN: 22 mg/dL (ref 8–23)
CO2: 21 mmol/L — ABNORMAL LOW (ref 22–32)
Calcium: 8.4 mg/dL — ABNORMAL LOW (ref 8.9–10.3)
Chloride: 101 mmol/L (ref 98–111)
Creatinine, Ser: 1.03 mg/dL (ref 0.61–1.24)
GFR calc Af Amer: >60 mL/min (ref >60)
GFR calc non Af Amer: >60 mL/min (ref >60)
Glucose, Bld: 117 mg/dL — ABNORMAL HIGH (ref 70–99)
Potassium: 4.4 mmol/L (ref 3.5–5.1)
Sodium: 133 mmol/L — ABNORMAL LOW (ref 135–145)
Total Bilirubin: 3.1 mg/dL — ABNORMAL HIGH (ref 0.3–1.2)
Total Protein: 5.4 g/dL — ABNORMAL LOW (ref 6.5–8.1)

## 2020-01-10 LAB — URINALYSIS, ROUTINE W REFLEX MICROSCOPIC
Bilirubin Urine: NEGATIVE
Glucose, UA: NEGATIVE mg/dL
Hgb urine dipstick: NEGATIVE
Ketones, ur: NEGATIVE mg/dL
Nitrite: NEGATIVE
Protein, ur: NEGATIVE mg/dL
Specific Gravity, Urine: 1.014 (ref 1.005–1.030)
pH: 5 (ref 5.0–8.0)

## 2020-01-10 LAB — LIPASE, BLOOD: Lipase: 30 U/L (ref 11–51)

## 2020-01-10 LAB — PREPARE RBC (CROSSMATCH)

## 2020-01-10 LAB — BRAIN NATRIURETIC PEPTIDE: B Natriuretic Peptide: 154.8 pg/mL — ABNORMAL HIGH (ref 0.0–100.0)

## 2020-01-10 LAB — CBG MONITORING, ED: Glucose-Capillary: 110 mg/dL — ABNORMAL HIGH (ref 70–99)

## 2020-01-10 MED ORDER — SODIUM CHLORIDE 0.9% FLUSH: 3.0000 mL | Freq: Once | INTRAVENOUS | Status: DC

## 2020-01-10 MED ORDER — FUROSEMIDE 10 MG/ML IJ SOLN
20.0000 mg | Freq: Once | INTRAMUSCULAR | Status: AC
  Administered 2020-01-10: 20 mg via INTRAVENOUS
  Filled 2020-01-10: qty 2

## 2020-01-10 MED ORDER — SODIUM CHLORIDE 0.9% IV SOLUTION: Freq: Once | INTRAVENOUS | Status: DC

## 2020-01-10 NOTE — Telephone Encounter (Signed)
Austin Oconnor with [facility] called with concern of increased swelling and pain in patient's entire leg. She states that there is 2+ pitting edema and concern of possible blood clot. Patient advised to go to ED for check.

## 2020-01-10 NOTE — Telephone Encounter (Signed)
Advise daughter to let them at the ED that Dr Erlinda Hong wanted an U/S. Order for U/S made.

## 2020-01-10 NOTE — ED Provider Notes (Signed)
[facility] PC[facility] Provider Note   CSN: [id number] Arrival date & time: 01/10/20  1723     History Chief Complaint  Patient presents with  . Groin Swelling  . Leg Swelling    Austin Oconnor is a 79 y.o. male.  79 yo M with a chief complaints of lower extremity edema.  This been occurring since he had surgery on his left hip from a pathologic fx.  No chest pain or sob.  No orthopnea.  Sleeping in a chair because its too hard for him to get up and down.   The history is provided by the patient.  Illness Severity:  Moderate Onset quality:  Gradual Duration:  1 week Timing:  Constant Progression:  Worsening Chronicity:  New Associated symptoms: no abdominal pain, no chest pain, no congestion, no diarrhea, no fever, no headaches, no myalgias, no rash, no shortness of breath and no vomiting        Past Medical History:  Diagnosis Date  . At risk for sleep apnea    STOP-BANG= 4      . Elevated PSA   . Hypertension   . Infection    left hip  . Nocturia   . Prostate cancer ([name])   . Wears partial dentures     Patient Active Problem List   Diagnosis Date Noted  . Symptomatic anemia 01/10/2020  . Abscess of hip 12/16/2019  . Pancytopenia ([place]) 10/21/2019  . Cardiomegaly 10/21/2019  . Pathologic intertrochanteric fracture, left, initial encounter ([place]) 10/20/2019  . Essential hypertension 10/20/2019  . Goals of care, counseling/discussion 08/16/2019  . Malignant neoplasm of prostate ([place]) 02/05/2016  . 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 I & D EXTREMITY Left 12/16/2019   Procedure: Left hip incision and drainage;  Surgeon: Meredith Pel, MD;  Location: [place];  Service: Orthopedics;  Laterality: Left;  . INGUINAL HERNIA REPAIR Bilateral 1990  &  1960s  . INTRAMEDULLARY (IM) NAIL INTERTROCHANTERIC Left 10/21/2019   Procedure: LEFT INTERTROCHANTERIC INTRAMEDULLARY (IM) NAIL;  Surgeon: Leandrew Koyanagi,  MD;  Location: [facility];  Service: Orthopedics;  Laterality: Left;  . LUMBAR SPINE SURGERY  age 70's  . PROSTATE BIOPSY N/A 09/16/2014   Procedure: BIOPSY TRANSRECTAL ULTRASONIC PROSTATE (TUBP);  Surgeon: Arvil Persons, MD;  Location: [facility];  Service: Urology;  Laterality: N/A;       Family History  Problem Relation Age of Onset  . Prostate cancer Brother   . Cancer Neg Hx   . Breast cancer Neg Hx   . Colon cancer Neg Hx   . Pancreatic cancer Neg Hx     Social History   Tobacco Use  . Smoking status: Never Smoker  . Smokeless tobacco: Never Used  Substance Use Topics  . Alcohol use: No  . Drug use: No    Home Medications Prior to Admission medications   Medication Sig Start Date End Date Taking? Authorizing Provider  aspirin EC 81 MG tablet Take 1 tablet (81 mg total) by mouth 2 (two) times daily. 12/17/19  Yes [name], PA-C  cephALEXin (KEFLEX) 500 MG capsule Take 1 capsule (500 mg total) by mouth 4 (four) times daily. 01/04/20  Yes [name], PA-C  Cholecalciferol (VITAMIN D3) 1000 UNITS CAPS Take 1,000 Units by mouth daily.    Yes [provider]  furosemide (LASIX) 20 MG tablet Take 20 mg by mouth daily.  01/07/20  Yes [provider]  ibuprofen (ADVIL) 800 MG tablet Take 1 tablet (800 mg total) by mouth every 8 (eight) hours as needed. Patient taking differently: Take 800 mg by mouth every 8 (eight) hours as needed (pain).  12/21/19  Yes [name], PA-C  lisinopril (PRINIVIL,ZESTRIL) 10 MG tablet Take 1 tablet by mouth every evening.  01/08/17  Yes [provider]  oxyCODONE-acetaminophen (PERCOCET) 5-325 MG tablet Take 1-2 tablets by mouth daily as needed for severe pain. Patient taking differently: Take 1 tablet by mouth 4 (four) times daily as needed for severe pain.  01/04/20  Yes Leandrew Koyanagi, MD  vitamin B-12 (CYANOCOBALAMIN) 1000 MCG tablet Take 1,000 mcg by mouth daily.   Yes [provider]  zinc  sulfate 220 (50 Zn) MG capsule Take 1 capsule (220 mg total) by mouth daily. 10/21/19  Yes Leandrew Koyanagi, MD  calcium-vitamin D (OSCAL WITH D) 500-200 MG-UNIT tablet Take 1 tablet by mouth 3 (three) times daily. Patient not taking: Reported on 01/10/2020 10/21/19   Leandrew Koyanagi, MD  methocarbamol (ROBAXIN) 750 MG tablet Take 1 tablet (750 mg total) by mouth 2 (two) times daily as needed for muscle spasms. Patient not taking: Reported on 01/10/2020 10/21/19   Leandrew Koyanagi, MD    Allergies    Patient has no known allergies.  Review of Systems   Review of Systems  Constitutional: Negative for chills and fever.  HENT: Negative for congestion and facial swelling.   Eyes: Negative for discharge and visual disturbance.  Respiratory: Negative for shortness of breath.   Cardiovascular: Positive for leg swelling. Negative for chest pain and palpitations.  Gastrointestinal: Negative for abdominal pain, diarrhea and vomiting.  Musculoskeletal: Negative for arthralgias and myalgias.  Skin: Negative for color change and rash.  Neurological: Negative for tremors, syncope and headaches.  Psychiatric/Behavioral: Negative for confusion and dysphoric mood.    Physical Exam Updated Vital Signs BP (!) 140/96   Pulse 93   Temp 97.7 F (36.5 C)   Resp 18   SpO2 100%   Physical Exam Vitals and nursing note reviewed.  Constitutional:      Appearance: He is well-developed.  HENT:     Head: Normocephalic and atraumatic.  Eyes:     Pupils: Pupils are equal, round, and reactive to light.  Neck:     Vascular: No JVD.  Cardiovascular:     Rate and Rhythm: Normal rate and regular rhythm.     Heart sounds: No murmur. No friction rub. No gallop.   Pulmonary:     Effort: No respiratory distress.     Breath sounds: No wheezing.  Abdominal:     General: There is no distension.     Tenderness: There is no guarding or rebound.  Genitourinary:    Comments: Soft and brown  Musculoskeletal:         General: Normal range of motion.     Cervical back: Normal range of motion and neck supple.     Right lower leg: Edema present.     Left lower leg: Edema present.     Comments: 4+ to the pelvis  Skin:    Coloration: Skin is not pale.     Findings: No rash.  Neurological:     Mental Status: He is alert and oriented to person, place, and time.  Psychiatric:        Behavior: Behavior normal.     ED Results / Procedures / Treatments  Labs (all labs ordered are listed, but only abnormal results are displayed) Labs Reviewed  COMPREHENSIVE METABOLIC PANEL - Abnormal; Notable for the following components:      Result Value   Sodium 133 (*)    CO2 21 (*)    Glucose, Bld 117 (*)    Calcium 8.4 (*)    Total Protein 5.4 (*)    Albumin 2.3 (*)    AST 231 (*)    ALT 65 (*)    Alkaline Phosphatase 961 (*)    Total Bilirubin 3.1 (*)    All other components within normal limits  CBC - Abnormal; Notable for the following components:   WBC 12.0 (*)    RBC 2.12 (*)    Hemoglobin 6.2 (*)    HCT 20.5 (*)    RDW 25.6 (*)    Platelets 63 (*)    nRBC 24.0 (*)    All other components within normal limits  URINALYSIS, ROUTINE W REFLEX MICROSCOPIC - Abnormal; Notable for the following components:   Color, Urine AMBER (*)    Leukocytes,Ua MODERATE (*)    Bacteria, UA RARE (*)    All other components within normal limits  CBG MONITORING, ED - Abnormal; Notable for the following components:   Glucose-Capillary 110 (*)    All other components within normal limits  SARS CORONAVIRUS 2 (TAT 6-24 HRS)  LIPASE, BLOOD  BRAIN NATRIURETIC PEPTIDE  PREPARE RBC (CROSSMATCH)  TYPE AND SCREEN    EKG None  Radiology DG Chest Port 1 View  Result Date: 01/10/2020 CLINICAL DATA:  Leg edema EXAM: PORTABLE CHEST 1 VIEW COMPARISON:  10/20/2019 FINDINGS: Mild cardiomegaly. Stable unfolded aorta. Small right-sided pleural effusion. Heterogeneous sclerosis consistent with skeletal metastatic disease.  Possible healing left eighth rib fracture. IMPRESSION: 1. Cardiomegaly with small right-sided pleural effusion. 2. Heterogeneous sclerosis involving the ribs, spine and shoulders consistent with diffuse skeletal metastatic disease. Electronically Signed   By: Donavan Foil M.D.   On: 01/10/2020 21:54    Procedures Procedures (including critical care time)  Medications Ordered in ED Medications  sodium chloride flush (NS) 0.9 % injection 3 mL (has no administration in time range)  0.9 %  sodium chloride infusion (Manually program via Guardrails IV Fluids) (has no administration in time range)  furosemide (LASIX) injection 20 mg (20 mg Intravenous Given 01/10/20 2145)    ED Course  I have reviewed the triage vital signs and the nursing notes.  Pertinent labs & imaging results that were available during my care of the patient were reviewed by me and considered in my medical decision making (see chart for details).    MDM Rules/Calculators/A&P                      79 yo M with a cc of leg edema.  Going on for the past week.  Likely dependant edema, no renal dysfunction from baseline.  hbg 6.2.  Likely due to his cancer.  No blood in stool or dark stool.  Rectal exam without dark stool or blood.   CRITICAL CARE Performed by: Cecilio Asper   Total critical care time: 35 minutes  Critical care time was exclusive of separately billable procedures and treating other patients.  Critical care was necessary to treat or prevent imminent or life-threatening deterioration.  Critical care was time spent personally by me on the following activities: development of treatment plan with patient and/or surrogate as well as nursing, discussions with consultants, evaluation of  patient's response to treatment, examination of patient, obtaining history from patient or surrogate, ordering and performing treatments and interventions, ordering and review of laboratory studies, ordering and review of  radiographic studies, pulse oximetry and re-evaluation of patient's condition.  The patients results and plan were reviewed and discussed.   Any x-rays performed were independently reviewed by myself.   Differential diagnosis were considered with the presenting HPI.  Medications  sodium chloride flush (NS) 0.9 % injection 3 mL (has no administration in time range)  0.9 %  sodium chloride infusion (Manually program via Guardrails IV Fluids) (has no administration in time range)  furosemide (LASIX) injection 20 mg (20 mg Intravenous Given 01/10/20 2145)    Vitals:   01/10/20 2200 01/10/20 2230 01/10/20 2245 01/10/20 2300  BP: 116/80  130/65 (!) 140/96  Pulse:   93   Resp: (!) 27 20 18 18   Temp:   98.1 F (36.7 C) 97.7 F (36.5 C)  TempSrc:   Oral   SpO2:        Final diagnoses:  Symptomatic anemia  Dependent edema    Admission/ observation were discussed with the admitting physician, patient and/or family and they are comfortable with the plan.   Final Clinical Impression(s) / ED Diagnoses Final diagnoses:  Symptomatic anemia  Dependent edema    Rx / DC Orders ED Discharge Orders    None       Deno Etienne, DO 01/10/20 2343

## 2020-01-10 NOTE — Telephone Encounter (Signed)
Please see message from Dr. Erlinda Hong below.  I had advised Beverlee Nims, RN with [facility] to send patient to ED.

## 2020-01-10 NOTE — Telephone Encounter (Signed)
Order made. Called patient twice no answer.

## 2020-01-10 NOTE — Telephone Encounter (Signed)
Called patients daughter instead since he did not answer. Patients daughter states they are going to the ED Now.

## 2020-01-10 NOTE — ED Triage Notes (Signed)
Pt arrives pov with daughter for 1 month of groin and leg swelling. Pt denies any sob.

## 2020-01-10 NOTE — ED Notes (Signed)
Date and time results received: 01/10/20 (use smartphrase ".now" to insert current time)  Test: hemoglobin Critical Value: 6.2  Name of Provider Notified: Tyrone Nine Orders Received? Or Actions Taken?:

## 2020-01-10 NOTE — H&P (Signed)
History and Physical   Austin Oconnor [id number] DOB: 06-Mar-1941 DOA: 01/10/2020  Referring MD/NP/PA: Dr Tyrone Nine  PCP: [name], MD   Outpatient Specialists: Dr. Erlinda Hong, orthopedics  Patient coming from: Home  Chief Complaint: Progressive leg and scrotal swelling  HPI: Austin Oconnor is a 79 y.o. 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  Associated with weakness shortness of breath.  Patient also has been weak and debilitated over the last few weeks.  He has noted occasional melena but no bright red blood per rectum.  Symptoms have been progressively worse since his last left hip surgery in December.  He has been sleeping in the chair with his legs literally dangling.  Noted to have massively swollen bilateral lower extremity.  Also significant anemia with hemoglobin of 6.2.  It was 8.1 a month ago.  Stool guaiacs are still pending but patient appears to have symptomatic anemia with fluid overload suspected CHF.  He is being admitted to the hospital for treatment.,.  ED Course: Temperature is 98.7 blood pressure 140/96 pulse 97 respiratory of 27 oxygen sats 100% on 2 L.  White count 12.0 hemoglobin 6.2 and platelets of 63, sodium is 133 glucose 117.  Total bilirubin 3.1 AST 231 ALT 65.  Chest x-ray showed cardiomegaly with small right-sided pleural effusions.  Also multiple sclerosis involving the rib spine and shoulders consistent with diffuse skeletal metastatic disease.  Patient being admitted with possible demand ischemia with CHF as a result of chronic anemia.  2 units of packed red blood cells have been ordered  Review of Systems: As per HPI otherwise 10 point review of systems negative.    Past Medical History:    Diagnosis Date  . At risk for sleep apnea    STOP-BANG= 4      . Elevated PSA   . Hypertension   . Infection    left hip  . Nocturia   . Prostate cancer ([place])   . Wears partial dentures     Past Surgical History:  Procedure Laterality Date  . I & D EXTREMITY Left 12/16/2019   Procedure: Left hip incision and drainage;  Surgeon: Meredith Pel, MD;  Location: [facility];  Service: Orthopedics;  Laterality: Left;  . INGUINAL HERNIA REPAIR Bilateral 1990  &  1960s  . INTRAMEDULLARY (IM) NAIL INTERTROCHANTERIC Left 10/21/2019   Procedure: LEFT INTERTROCHANTERIC INTRAMEDULLARY (IM) NAIL;  Surgeon: Leandrew Koyanagi, MD;  Location: [facility];  Service: Orthopedics;  Laterality: Left;  . LUMBAR SPINE SURGERY  age 60's  . PROSTATE BIOPSY N/A 09/16/2014   Procedure: BIOPSY TRANSRECTAL ULTRASONIC PROSTATE (TUBP);  Surgeon: Arvil Persons, MD;  Location: [facility];  Service: Urology;  Laterality: N/A;     reports that he has never smoked. He has never used smokeless tobacco. He reports that he does not drink alcohol or use drugs.  No Known Allergies  Family History  Problem Relation Age of Onset  . Prostate cancer Brother   . Cancer Neg Hx   . Breast cancer Neg Hx   . Colon cancer Neg Hx   . Pancreatic cancer Neg Hx      Prior to Admission medications   Medication Sig Start Date End Date Taking? Authorizing Provider  alendronate (FOSAMAX)  70 MG tablet Take 70 mg by mouth once a week. 10/11/19   [provider]  aspirin EC 81 MG tablet Take 1 tablet (81 mg total) by mouth 2 (two) times daily. 12/17/19   [name], PA-C  calcium-vitamin D (OSCAL WITH D) 500-200 MG-UNIT tablet Take 1 tablet by mouth 3 (three) times daily. 10/21/19   Leandrew Koyanagi, MD  cephALEXin (KEFLEX) 500 MG capsule Take 1 capsule (500 mg total) by mouth 4 (four) times daily. 01/04/20   [name], PA-C  Cholecalciferol (VITAMIN D3) 1000 UNITS CAPS Take 1 capsule by mouth daily.    [provider]  ibuprofen (ADVIL) 800 MG tablet Take 1 tablet (800 mg total) by mouth every 8 (eight) hours as needed. 12/21/19   [name], PA-C  lisinopril (PRINIVIL,ZESTRIL) 10 MG tablet Take 1 tablet by mouth every evening.  01/08/17   [provider]  methocarbamol (ROBAXIN) 750 MG tablet Take 1 tablet (750 mg total) by mouth 2 (two) times daily as needed for muscle spasms. 10/21/19   Leandrew Koyanagi, MD  oxyCODONE-acetaminophen (PERCOCET) 5-325 MG tablet Take 1-2 tablets by mouth daily as needed for severe pain. 01/04/20   Leandrew Koyanagi, MD  vitamin B-12 (CYANOCOBALAMIN) 1000 MCG tablet Take 1,000 mcg by mouth daily.    [provider]  zinc sulfate 220 (50 Zn) MG capsule Take 1 capsule (220 mg total) by mouth daily. 10/21/19   Leandrew Koyanagi, MD    Physical Exam: Vitals:   01/10/20 2230 01/10/20 2245 01/10/20 2300 01/11/20 0009  BP:  130/65 (!) 140/96 130/76  Pulse:  93  88  Resp: 20 18 18 16   Temp:  98.1 F (36.7 C) 97.7 F (36.5 C) 98.7 F (37.1 C)  TempSrc:  Oral  Oral  SpO2:    99%      Constitutional: Chronically ill looking, bilateral lower extremity edema and anasarca Vitals:   01/10/20 2230 01/10/20 2245 01/10/20 2300 01/11/20 0009  BP:  130/65 (!) 140/96 130/76  Pulse:  93  88  Resp: 20 18 18 16   Temp:  98.1 F (36.7 C) 97.7 F (36.5 C) 98.7 F (37.1 C)  TempSrc:  Oral  Oral  SpO2:    99%   Eyes: PERRL, lids and conjunctivae jaundiced ENMT: Mucous membranes are moist. Posterior pharynx clear of any exudate or lesions.Normal dentition.  Neck: normal, supple, no masses, no thyromegaly Respiratory: Coarse breath sound, decreased at the bases, positive crackles, no wheeze, normal respiratory effort. No accessory muscle use.  Cardiovascular: Sinus tachycardia no murmurs / rubs / gallops.  4+ pedal edema all the way to scrotum 2+ pedal pulses. No carotid bruits.  Abdomen: no tenderness, no masses palpated. No hepatosplenomegaly. Bowel sounds  positive.  Musculoskeletal: no clubbing / cyanosis. No joint deformity upper and lower extremities. Good ROM, no contractures. Normal muscle tone.  Marked muscle wasting in the upper extremities and trunk Skin: no rashes, lesions, ulcers. No induration Neurologic: CN 2-12 grossly intact. Sensation intact, DTR normal. Strength 5/5 in all 4.  Psychiatric: Normal judgment and insight. Alert and oriented x 3. Normal mood.     Labs on Admission: I have personally reviewed following labs and imaging studies  CBC: Recent Labs  Lab 01/10/20 1824  WBC 12.0*  HGB 6.2*  HCT 20.5*  MCV 96.7  PLT 63*   Basic Metabolic Panel: Recent Labs  Lab 01/10/20 1824  NA 133*  K 4.4  CL 101  CO2 21*  GLUCOSE 117*  BUN 22  CREATININE 1.03  CALCIUM 8.4*   GFR: Estimated Creatinine Clearance: 56.1 mL/min (by C-G formula based on SCr of 1.03 mg/dL). Liver Function Tests: Recent Labs  Lab 01/10/20 1824  AST 231*  ALT 65*  ALKPHOS 961*  BILITOT 3.1*  PROT 5.4*  ALBUMIN 2.3*   Recent Labs  Lab 01/10/20 1824  LIPASE 30   No results for input(s): AMMONIA in the last 168 hours. Coagulation Profile: No results for input(s): INR, PROTIME in the last 168 hours. Cardiac Enzymes: No results for input(s): CKTOTAL, CKMB, CKMBINDEX, TROPONINI in the last 168 hours. BNP (last 3 results) No results for input(s): PROBNP in the last 8760 hours. HbA1C: No results for input(s): HGBA1C in the last 72 hours. CBG: Recent Labs  Lab 01/10/20 1825  GLUCAP 110*   Lipid Profile: No results for input(s): CHOL, HDL, LDLCALC, TRIG, CHOLHDL, LDLDIRECT in the last 72 hours. Thyroid Function Tests: No results for input(s): TSH, T4TOTAL, FREET4, T3FREE, THYROIDAB in the last 72 hours. Anemia Panel: No results for input(s): VITAMINB12, FOLATE, FERRITIN, TIBC, IRON, RETICCTPCT in the last 72 hours. Urine analysis:    Component Value Date/Time   COLORURINE AMBER (A) 01/10/2020 2057   APPEARANCEUR CLEAR  01/10/2020 2057   LABSPEC 1.014 01/10/2020 2057   PHURINE 5.0 01/10/2020 2057   GLUCOSEU NEGATIVE 01/10/2020 2057   HGBUR NEGATIVE 01/10/2020 2057   BILIRUBINUR NEGATIVE 01/10/2020 2057   KETONESUR NEGATIVE 01/10/2020 2057   PROTEINUR NEGATIVE 01/10/2020 2057   NITRITE NEGATIVE 01/10/2020 2057   LEUKOCYTESUR MODERATE (A) 01/10/2020 2057   Sepsis Labs: @LABRCNTIP (procalcitonin:4,lacticidven:4) )No results found for this or any previous visit (from the past 240 hour(s)).   Radiological Exams on Admission: DG Chest Port 1 View  Result Date: 01/10/2020 CLINICAL DATA:  Leg edema EXAM: PORTABLE CHEST 1 VIEW COMPARISON:  10/20/2019 FINDINGS: Mild cardiomegaly. Stable unfolded aorta. Small right-sided pleural effusion. Heterogeneous sclerosis consistent with skeletal metastatic disease. Possible healing left eighth rib fracture. IMPRESSION: 1. Cardiomegaly with small right-sided pleural effusion. 2. Heterogeneous sclerosis involving the ribs, spine and shoulders consistent with diffuse skeletal metastatic disease. Electronically Signed   By: Donavan Foil M.D.   On: 01/10/2020 21:54      Assessment/Plan Principal Problem:   Symptomatic anemia Active Problems:   Malignant neoplasm of prostate (HCC)   Essential hypertension   Abscess of hip   Thrombocytopenia (HCC)     #1 symptomatic anemia: Most likely due to chronic disease but could also be slow GI bleed.  Stool guaiacs to be ordered.  Patient transfused 2 units of packed red blood cells ongoing.  Will check echocardiogram due to bilateral edema.  With chronic malignancy again this could be anemia of chronic disease.  Patient is jaundiced so could also be element of hemolytic disease.  Check LDH.  Continue treatment thereafter.  If stool guaiacs are positive we will get GI consult  #2 left hip wound: Appears to be healing well.  Follow-up with orthopedics.  #3 metastatic prostate cancer: Appears to be slightly advanced.  Will defer to  oncology.  #4 essential hypertension: Confirm and resume home regimen.  #5 thrombocytopenia: Most likely cancer related.  He has had pancytopenia in the past but now leukocytosis.  Monitor and continue home regimen  #6 anasarca: Suspected CHF due to demand as a result of chronic anemia.  Get echocardiogram.  Aggressive diuresis   DVT prophylaxis: SCD  Code Status: Full code Family Communication: Daughter with the  patient Disposition Plan: To be determined Consults called: None Admission status: Inpatient  Severity of Illness: The appropriate patient status for this patient is INPATIENT. Inpatient status is judged to be reasonable and necessary in order to provide the required intensity of service to ensure the patient's safety. The patient's presenting symptoms, physical exam findings, and initial radiographic and laboratory data in the context of their chronic comorbidities is felt to place them at high risk for further clinical deterioration. Furthermore, it is not anticipated that the patient will be medically stable for discharge from the hospital within 2 midnights of admission. The following factors support the patient status of inpatient.   " The patient's presenting symptoms include bilateral lower extremity edema. " The worrisome physical exam findings include anasarca. " The initial radiographic and laboratory data are worrisome because of hemoglobin six-point. " The chronic co-morbidities include metastatic prostate cancer.   * I certify that at the point of admission it is my clinical judgment that the patient will require inpatient hospital care spanning beyond 2 midnights from the point of admission due to high intensity of service, high risk for further deterioration and high frequency of surveillance required.Barbette Merino MD [facility] Pager [phone]  If 7PM-7AM, please contact night-coverage [email or web address] Password [email or web address]  01/11/2020, 12:10 AM

## 2020-01-10 NOTE — Telephone Encounter (Signed)
ok 

## 2020-01-10 NOTE — Telephone Encounter (Signed)
Let's do a doppler.  Thanks.

## 2020-01-11 ENCOUNTER — Ambulatory Visit: Payer: Medicare Other

## 2020-01-11 ENCOUNTER — Encounter (HOSPITAL_COMMUNITY): Payer: Self-pay | Admitting: Internal Medicine

## 2020-01-11 ENCOUNTER — Other Ambulatory Visit: Payer: Self-pay

## 2020-01-11 ENCOUNTER — Telehealth: Payer: Self-pay

## 2020-01-11 DIAGNOSIS — C61 Malignant neoplasm of prostate: Secondary | ICD-10-CM

## 2020-01-11 DIAGNOSIS — Z7189 Other specified counseling: Secondary | ICD-10-CM

## 2020-01-11 DIAGNOSIS — D696 Thrombocytopenia, unspecified: Secondary | ICD-10-CM | POA: Diagnosis present

## 2020-01-11 DIAGNOSIS — Z515 Encounter for palliative care: Secondary | ICD-10-CM

## 2020-01-11 LAB — COMPREHENSIVE METABOLIC PANEL
ALT: 60 U/L — ABNORMAL HIGH (ref 0–44)
AST: 207 U/L — ABNORMAL HIGH (ref 15–41)
Albumin: 2.1 g/dL — ABNORMAL LOW (ref 3.5–5.0)
Alkaline Phosphatase: 968 U/L — ABNORMAL HIGH (ref 38–126)
Anion gap: 12 (ref 5–15)
BUN: 22 mg/dL (ref 8–23)
CO2: 21 mmol/L — ABNORMAL LOW (ref 22–32)
Calcium: 8.4 mg/dL — ABNORMAL LOW (ref 8.9–10.3)
Chloride: 101 mmol/L (ref 98–111)
Creatinine, Ser: 0.93 mg/dL (ref 0.61–1.24)
GFR calc Af Amer: >60 mL/min (ref >60)
GFR calc non Af Amer: >60 mL/min (ref >60)
Glucose, Bld: 85 mg/dL (ref 70–99)
Potassium: 4.3 mmol/L (ref 3.5–5.1)
Sodium: 134 mmol/L — ABNORMAL LOW (ref 135–145)
Total Bilirubin: 3.8 mg/dL — ABNORMAL HIGH (ref 0.3–1.2)
Total Protein: 5.5 g/dL — ABNORMAL LOW (ref 6.5–8.1)

## 2020-01-11 LAB — CBC
HCT: 27 % — ABNORMAL LOW (ref 39.0–52.0)
Hemoglobin: 9 g/dL — ABNORMAL LOW (ref 13.0–17.0)
MCH: 29.4 pg (ref 26.0–34.0)
MCHC: 33.3 g/dL (ref 30.0–36.0)
MCV: 88.2 fL (ref 80.0–100.0)
Platelets: 52 10*3/uL — ABNORMAL LOW (ref 150–400)
RBC: 3.06 MIL/uL — ABNORMAL LOW (ref 4.22–5.81)
RDW: 22.6 % — ABNORMAL HIGH (ref 11.5–15.5)
WBC: 11.6 10*3/uL — ABNORMAL HIGH (ref 4.0–10.5)
nRBC: 19.5 % — ABNORMAL HIGH (ref 0.0–0.2)

## 2020-01-11 LAB — SARS CORONAVIRUS 2 (TAT 6-24 HRS): SARS Coronavirus 2: NEGATIVE

## 2020-01-11 MED ORDER — ACETAMINOPHEN 500 MG PO TABS
1000.0000 mg | ORAL_TABLET | Freq: Three times a day (TID) | ORAL | Status: DC
  Administered 2020-01-11 – 2020-01-18 (×22): 1000 mg via ORAL
  Filled 2020-01-11 (×22): qty 2

## 2020-01-11 MED ORDER — FUROSEMIDE 10 MG/ML IJ SOLN
40.0000 mg | Freq: Two times a day (BID) | INTRAMUSCULAR | Status: DC
  Administered 2020-01-11: 40 mg via INTRAVENOUS
  Filled 2020-01-11: qty 4

## 2020-01-11 MED ORDER — PNEUMOCOCCAL VAC POLYVALENT 25 MCG/0.5ML IJ INJ
0.5000 mL | INJECTION | INTRAMUSCULAR | Status: AC
  Administered 2020-01-12: 0.5 mL via INTRAMUSCULAR
  Filled 2020-01-11: qty 0.5

## 2020-01-11 MED ORDER — POTASSIUM CHLORIDE CRYS ER 20 MEQ PO TBCR
20.0000 meq | EXTENDED_RELEASE_TABLET | Freq: Two times a day (BID) | ORAL | Status: DC
  Administered 2020-01-11 – 2020-01-13 (×6): 20 meq via ORAL
  Filled 2020-01-11 (×6): qty 1

## 2020-01-11 MED ORDER — OXYCODONE HCL 5 MG PO TABS
7.5000 mg | ORAL_TABLET | ORAL | Status: DC | PRN
  Administered 2020-01-11 – 2020-01-12 (×4): 7.5 mg via ORAL
  Filled 2020-01-11 (×4): qty 2

## 2020-01-11 MED ORDER — FUROSEMIDE 10 MG/ML IJ SOLN
60.0000 mg | Freq: Two times a day (BID) | INTRAMUSCULAR | Status: DC
  Administered 2020-01-11 – 2020-01-14 (×7): 60 mg via INTRAVENOUS
  Filled 2020-01-11 (×7): qty 6

## 2020-01-11 MED ORDER — LISINOPRIL 10 MG PO TABS
10.0000 mg | ORAL_TABLET | Freq: Every evening | ORAL | Status: DC
  Administered 2020-01-11: 10 mg via ORAL
  Filled 2020-01-11: qty 1

## 2020-01-11 MED ORDER — OXYCODONE-ACETAMINOPHEN 5-325 MG PO TABS
1.0000 | ORAL_TABLET | Freq: Four times a day (QID) | ORAL | Status: DC | PRN
  Administered 2020-01-11 (×3): 1 via ORAL
  Filled 2020-01-11 (×4): qty 1

## 2020-01-11 MED ORDER — ENSURE ENLIVE PO LIQD
237.0000 mL | Freq: Two times a day (BID) | ORAL | Status: DC
  Administered 2020-01-12 – 2020-01-18 (×12): 237 mL via ORAL

## 2020-01-11 NOTE — Telephone Encounter (Signed)
Ok to do

## 2020-01-11 NOTE — Plan of Care (Signed)
  Problem: Education: Goal: Knowledge of General Education information will improve Description Including pain rating scale, medication(s)/side effects and non-pharmacologic comfort measures Outcome: Progressing   

## 2020-01-11 NOTE — Progress Notes (Addendum)
PROGRESS NOTE    Austin Oconnor  [id number] DOB: 05-Nov-1941 DOA: 01/10/2020 PCP: [name], MD      Brief Narrative:  Austin Oconnor is a 79 y.o. 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  In the ER, afebrile, hemodynamically stable.  Had marked swelling up to the abdomen, hemoglobin 6.2, transaminitis, normal renal function effusion.  Typed and screened, started on Lasix and admitted to the hospitalist service.           Assessment & Plan:  Acute on chronic diastolic CHF Right-sided pleural effusion EF last December 60 to [id number], grade 1 diastolic dysfunction, normal valves.  Presents with elevated BNP, substantial impressive edema, pleural effusion on chest x-ray. -Furosemide 60 mg IV twice a day  -K supplement -Strict I/Os, daily weights, telemetry  -Daily monitoring renal function -Transfusion to keep hemoglobin greater than 8 g/dl    Symptomatic anemia This is a combination of anemia of malignancy, anemia of chronic disease, and possibly acute blood loss anemia after his last surgery.  No recent clinical bleeding. -Trend hemoglobin -Transfuse 2 units -Transfusion threshold 8 g/dL  Castration resistant metastatic prostate cancer Patient's disease has progressed on third line treatment, he is now on supportive care only.  Dr. Hazeline Junker last note states "approaching end-stage status" and that performance status is "declining fast at this time" -Consult palliative care  Recent pathologic fracture complicated by abscess status post debridement I see no signs of infection.  Think his leukocytosis is more reactive.   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  No  obvious bleeding.  Transaminitis From mets, and also maybe congestion.  Hypertension BP normal -Continue lisionpril -Hold baby aspirin        Disposition: The patient was admitted with congestive heart failure.  He is still massively fluid overloaded and requires transfusion.    Given his degree of edema, I suspect he will need several more days (at least) of IV lasix.         MDM: The below labs and imaging reports were reviewed and summarized above.  Medication management as above.  This is a severe exacerbation of his chronic disease    DVT prophylaxis: SCDs Code Status: Full code Family Communication: Daughter by phone    Consultants:     Procedures:   3/2 blood transfusion --2 units PRBC  Antimicrobials:      Culture data:              Subjective: Patient is still massively swollen.  He has had no confusion, fever.  His right hip has some discomfort, but there is no redness around it or drainage on his dressing.  He has no dyspnea at rest.  He feels generally weak.  No melena or hematemesis.  Objective: Vitals:   01/11/20 0305 01/11/20 0429 01/11/20 0630 01/11/20 0852  BP: 125/72 131/80 123/74 127/80  Pulse: 81 82 88 90  Resp: 18 16 17 17   Temp: 98.1 F (36.7 C) 98.7 F (37.1 C) 98.3 F (36.8 C) 98.9 F (37.2 C)  TempSrc: Oral Oral Oral Oral  SpO2: 99% 100% 99% 100%  Weight:      Height:        Intake/Output Summary (Last 24 hours) at 01/11/2020 1142 Last data filed at 01/11/2020 0900  Gross per 24 hour  Intake 1555.41 ml  Output 1800 ml  Net -244.59 ml   Filed Weights   01/11/20 0009  Weight: 74.4 kg    Examination: General appearance: Ill-appearing, thin adult male, alert and in no acute distress.  Lying in bed HEENT: Anicteric, conjunctiva pink, lids and lashes normal. No nasal deformity, discharge, epistaxis.  Lips moist, oropharynx tacky dry, no oral lesions, dentition in adequate repair.  Hearing normal. Skin: Warm  and dry.  No jaundice.  No suspicious rashes or lesions. Cardiac: RRR, nl S1-S2, no murmurs appreciated.  Capillary refill is brisk.  JVP elevated.  4+ LE edema to the abdomen, scrotal edema.  Radial pulses 2+ and symmetric. Respiratory: Normal respiratory rate and rhythm.  Basilar rales, diminished on the right, no wheezing.   Abdomen: Abdomen soft.  No TTP or guarding or rigidity. No ascites, distension, hepatosplenomegaly.   MSK: No deformities or effusions.  Diffuse loss of subcutaneous muscle mass of back, thenar wasting noted. Neuro: Awake and alert.  EOMI, moves all extremities with severe generalized weakness. Speech fluent.    Psych: Sensorium intact and responding to questions, attention normal. Affect normal.  Judgment and insight appear normal.    Data Reviewed: I have personally reviewed following labs and imaging studies:  CBC: Recent Labs  Lab 01/10/20 1824  WBC 12.0*  HGB 6.2*  HCT 20.5*  MCV 96.7  PLT 63*   Basic Metabolic Panel: Recent Labs  Lab 01/10/20 1824  NA 133*  K 4.4  CL 101  CO2 21*  GLUCOSE 117*  BUN 22  CREATININE 1.03  CALCIUM 8.4*   GFR: Estimated Creatinine Clearance: 57.2 mL/min (by C-G formula based on SCr of 1.03 mg/dL). Liver Function Tests: Recent Labs  Lab 01/10/20 1824  AST 231*  ALT 65*  ALKPHOS 961*  BILITOT 3.1*  PROT 5.4*  ALBUMIN 2.3*   Recent Labs  Lab 01/10/20 1824  LIPASE 30   No results for input(s): AMMONIA in the last 168 hours. Coagulation Profile: No results for input(s): INR, PROTIME in the last 168 hours. Cardiac Enzymes: No results for input(s): CKTOTAL, CKMB, CKMBINDEX, TROPONINI in the last 168 hours. BNP (last 3 results) No results for input(s): PROBNP in the last 8760 hours. HbA1C: No results for input(s): HGBA1C in the last 72 hours. CBG: Recent Labs  Lab 01/10/20 1825  GLUCAP 110*   Lipid Profile: No results for input(s): CHOL, HDL, LDLCALC, TRIG, CHOLHDL, LDLDIRECT in the last 72  hours. Thyroid Function Tests: No results for input(s): TSH, T4TOTAL, FREET4, T3FREE, THYROIDAB in the last 72 hours. Anemia Panel: No results for input(s): VITAMINB12, FOLATE, FERRITIN, TIBC, IRON, RETICCTPCT in the last 72 hours. Urine analysis:    Component Value Date/Time   COLORURINE AMBER (A) 01/10/2020 2057   APPEARANCEUR CLEAR 01/10/2020 2057   LABSPEC 1.014 01/10/2020 2057   PHURINE 5.0 01/10/2020 2057   GLUCOSEU NEGATIVE 01/10/2020 2057   HGBUR NEGATIVE 01/10/2020 2057   BILIRUBINUR NEGATIVE 01/10/2020 2057   KETONESUR NEGATIVE 01/10/2020 2057   PROTEINUR NEGATIVE 01/10/2020 2057   NITRITE NEGATIVE 01/10/2020 2057   LEUKOCYTESUR MODERATE (A) 01/10/2020 2057   Sepsis Labs: @LABRCNTIP (procalcitonin:4,lacticacidven:4)  ) Recent Results (from the past 240 hour(s))  SARS CORONAVIRUS 2 (TAT 6-24 HRS) Nasopharyngeal Nasopharyngeal Swab     Status: None   Collection Time: 01/10/20  9:47 PM   Specimen: Nasopharyngeal Swab  Result Value Ref Range Status   SARS Coronavirus 2 NEGATIVE NEGATIVE Final    Comment: (NOTE)  SARS-CoV-2 target nucleic acids are NOT DETECTED. The SARS-CoV-2 RNA is generally detectable in upper and lower respiratory specimens during the acute phase of infection. Negative results do not preclude SARS-CoV-2 infection, do not rule out co-infections with other pathogens, and should not be used as the sole basis for treatment or other patient management decisions. Negative results must be combined with clinical observations, patient history, and epidemiological information. The expected result is Negative. Fact Sheet for Patients: SugarRoll.be Fact Sheet for Healthcare Providers: https://www.woods-mathews.com/ This test is not yet approved or cleared by the [place] FDA and  has been authorized for detection and/or diagnosis of SARS-CoV-2 by FDA under an Emergency Use Authorization (EUA). This EUA will remain   in effect (meaning this test can be used) for the duration of the COVID-19 declaration under Section 56 4(b)(1) of the Act, 21 U.S.C. section 360bbb-3(b)(1), unless the authorization is terminated or revoked sooner. Performed at [facility], [place] [address], [place], [place] [address]          Radiology Studies: DG Chest Port 1 View  Result Date: 01/10/2020 CLINICAL DATA:  Leg edema EXAM: PORTABLE CHEST 1 VIEW COMPARISON:  10/20/2019 FINDINGS: Mild cardiomegaly. Stable unfolded aorta. Small right-sided pleural effusion. Heterogeneous sclerosis consistent with skeletal metastatic disease. Possible healing left eighth rib fracture. IMPRESSION: 1. Cardiomegaly with small right-sided pleural effusion. 2. Heterogeneous sclerosis involving the ribs, spine and shoulders consistent with diffuse skeletal metastatic disease. Electronically Signed   By: Donavan Foil M.D.   On: 01/10/2020 21:54        Scheduled Meds: . sodium chloride   Intravenous Once  . furosemide  40 mg Intravenous BID  . lisinopril  10 mg Oral QPM  . [START ON 01/12/2020] pneumococcal 23 valent vaccine  0.5 mL Intramuscular Tomorrow-1000  . potassium chloride  20 mEq Oral BID  . sodium chloride flush  3 mL Intravenous Once   Continuous Infusions:   LOS: 1 day    Time spent: 35 minutes    Edwin Dada, MD [facility] 01/11/2020, 11:42 AM     Please page though [place] or Epic secure chat:  For [employer], Adult nurse

## 2020-01-11 NOTE — Telephone Encounter (Signed)
Diane nurse with [facility] called LM on triage line asking for call back with verbal orders to add on nursing services 2wk/2 1wk/3 Please call her to advise [phone]

## 2020-01-11 NOTE — Consult Note (Signed)
Consultation Note Date: 01/11/2020   Patient Name: Austin Oconnor  DOB: [birth year]  MRN: [id number]  Age / Sex: 79 y.o., male  PCP: [name], MD Referring Physician: Edwin Dada, *  Reason for Consultation: Establishing goals of care  HPI/Patient Profile: 79 y.o. 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 Found to have hgb of 6.2. Diagnosed with acute on chronic diastolic CHF and treated with IV furosemide. He was also transfused with 2 units of PRBC. Per Dr. Alen Blew, his cancer has progressed on third line treatment and he is now on supportive care only - he is "approaching end-stage status". PMT consulted for [place].   Clinical Assessment and Goals of Care: I have reviewed medical records including EPIC notes, labs and imaging, received report from Dr. Loleta Books, assessed the patient and then met with the patient to discuss diagnosis prognosis, GOC, EOL wishes, disposition and options.  I introduced Palliative Medicine as specialized medical care for people living with serious illness. It focuses on providing relief from the symptoms and stress of a serious illness. The goal is to improve quality of life for both the patient and the family.  We discussed a brief life review of the patient. Patient tells me he lives at home alone but his children, Truman Hayward and Shelton Silvas, live close and help him often.   As far as functional and nutritional status, he tells me he gets around very well with a walker. His daughter assists with ADLs such as bathing. He does speak of decreased appetite. He enjoys vanilla ensure.    We discussed his current illness and what it means in the larger context of his on-going co-morbidities. Discussed reasons for current  admission and interventions ordered. Discussed his cancer disease process. Discussed his decline in function and appetite.   He tells me about medical providers sharing with him how ill he is - nearing end of life -  and that he did not want to hear that. We discussed the need to inform him of his health status so that he can make informed decisions. We discussed the need for his medical team to be aware of his wishes so we can provide the type of care he wants. He tells me "I'd rather just keep living until I die and not think about it".   I attempted to elicit values and goals of care important to the patient.  His main focus is getting back home.   We discussed code status - full code vs DNR was explained in detail. DNR was recommended d/t patient's metastatic cancer and poor functional status. He tells me this is not something he has ever considered and would like some time to think about it.   We discussed surrogate decision makers - he would want his daughter and son to make decisions jointly. Paperwork not needed as they are his next of kin and legal decision makers if he were unable.   Hospice and Palliative Care services outpatient were explained and offered. Patient tells me he has spoken to hospice a couple of weeks ago and they were not offering the type of care he wanted therefore he is not interested at this time. We discussed palliative care follow up outpatient and he is agreeable.   Questions and concerns were addressed.  The patient  was encouraged to call with questions or concerns.   Primary Decision Maker PATIENT 2 children as joint decision makers if patient unable  SUMMARY OF RECOMMENDATIONS   - patient does not want to discuss his illness in detail, not particularly interested in discussing goals of care - considering code status, DNR recommended - agreeable to outpatient palliative follow up - TOC referral placed - added ensure per patient request  Code Status/Advance  Care Planning:  Full code   Symptom Management:   C/o pain uncontrolled by current regimen - oxycodone just increased by Dr. Loleta Books, will reevaluate tomorrow  Prognosis:   Unable to determine  Discharge Planning: To Be Determined      Primary Diagnoses: Present on Admission: . Symptomatic anemia . Abscess of hip . Essential hypertension . Malignant neoplasm of prostate ([place]) . Thrombocytopenia ([place])   I have reviewed the medical record, interviewed the patient and family, and examined the patient. The following aspects are pertinent.  Past Medical History:  Diagnosis Date  . At risk for sleep apnea    STOP-BANG= 4      . CHF (congestive heart failure) ([place])   . Elevated PSA   . Hypertension   . Infection    left hip  . Nocturia   . Prostate cancer ([place])   . Wears partial dentures    Social History   Socioeconomic History  . Marital status: Widowed    Spouse name: Not on file  . Number of children: 3  . Years of education: Not on file  . Highest education level: Not on file  Occupational History  . Not on file  Tobacco Use  . Smoking status: Never Smoker  . Smokeless tobacco: Never Used  Substance and Sexual Activity  . Alcohol use: No  . Drug use: No  . Sexual activity: Not Currently  Other Topics Concern  . Not on file  Social History Narrative   3 children but one passed   Social Determinants of Health   Financial Resource Strain:   . Difficulty of Paying Living Expenses: Not on file  Food Insecurity:   . Worried About Charity fundraiser in the Last Year: Not on file  . Ran Out of Food in the Last Year: Not on file  Transportation Needs:   . Lack of Transportation (Medical): Not on file  . Lack of Transportation (Non-Medical): Not on file  Physical Activity:   . Days of Exercise per Week: Not on file  . Minutes of Exercise per Session: Not on file  Stress:   . Feeling of Stress : Not on file  Social Connections:   . Frequency of  Communication with Friends and Family: Not on file  . Frequency of Social Gatherings with Friends and Family: Not on file  . Attends Religious Services: Not on file  . Active Member of Clubs or Organizations: Not on file  . Attends Archivist Meetings: Not on file  . Marital Status: Not on file   Family History  Problem Relation Age of Onset  . Prostate cancer Brother   . Cancer Neg Hx   . Breast cancer Neg Hx   . Colon cancer Neg Hx   . Pancreatic cancer Neg Hx    Scheduled Meds: . sodium chloride   Intravenous Once  . acetaminophen  1,000 mg Oral TID  . furosemide  60 mg Intravenous BID  . lisinopril  10 mg Oral QPM  . [START ON 01/12/2020] pneumococcal 23  valent vaccine  0.5 mL Intramuscular Tomorrow-1000  . potassium chloride  20 mEq Oral BID  . sodium chloride flush  3 mL Intravenous Once   Continuous Infusions: PRN Meds:.oxyCODONE No Known Allergies Review of Systems  Constitutional: Positive for activity change, appetite change and unexpected weight change.  Respiratory: Negative for shortness of breath.   Cardiovascular: Positive for leg swelling.  Neurological: Positive for weakness.    Physical Exam Constitutional:      General: He is in acute distress.  HENT:     Head: Normocephalic and atraumatic.  Pulmonary:     Effort: Pulmonary effort is normal. No respiratory distress.  Skin:    General: Skin is warm and dry.  Neurological:     Mental Status: He is alert and oriented to person, place, and time.  Psychiatric:        Mood and Affect: Mood normal.        Thought Content: Thought content normal.     Vital Signs: BP 131/84 (BP Location: Left Arm)   Pulse 94   Temp 98.8 F (37.1 C) (Oral)   Resp 20   Ht '5\' 8"'  (1.727 m)   Wt 74.4 kg   SpO2 97%   BMI 24.94 kg/m  Pain Scale: 0-10 POSS *See Group Information*: 1-Acceptable,Awake and alert Pain Score: 3    SpO2: SpO2: 97 % O2 Device:SpO2: 97 % O2 Flow Rate: .   IO: Intake/output  summary:   Intake/Output Summary (Last 24 hours) at 01/11/2020 1547 Last data filed at 01/11/2020 1300 Gross per 24 hour  Intake 1795.41 ml  Output 1800 ml  Net -4.59 ml    LBM:   Baseline Weight: Weight: 74.4 kg Most recent weight: Weight: 74.4 kg     Palliative Assessment/Data: PPS 40%    Time Total: 60 minutes Greater than 50%  of this time was spent counseling and coordinating care related to the above assessment and plan.  Juel Burrow, DNP, AGNP-C Palliative Medicine Team [phone] Pager: [phone]

## 2020-01-12 ENCOUNTER — Ambulatory Visit: Payer: Medicare Other

## 2020-01-12 DIAGNOSIS — G893 Neoplasm related pain (acute) (chronic): Secondary | ICD-10-CM

## 2020-01-12 DIAGNOSIS — C7951 Secondary malignant neoplasm of bone: Secondary | ICD-10-CM

## 2020-01-12 LAB — HEPATIC FUNCTION PANEL
ALT: 61 U/L — ABNORMAL HIGH (ref 0–44)
AST: 210 U/L — ABNORMAL HIGH (ref 15–41)
Albumin: 2.1 g/dL — ABNORMAL LOW (ref 3.5–5.0)
Alkaline Phosphatase: 932 U/L — ABNORMAL HIGH (ref 38–126)
Bilirubin, Direct: 2 mg/dL — ABNORMAL HIGH (ref 0.0–0.2)
Indirect Bilirubin: 1.2 mg/dL — ABNORMAL HIGH (ref 0.3–0.9)
Total Bilirubin: 3.2 mg/dL — ABNORMAL HIGH (ref 0.3–1.2)
Total Protein: 5.5 g/dL — ABNORMAL LOW (ref 6.5–8.1)

## 2020-01-12 LAB — TYPE AND SCREEN
ABO/RH(D): B NEG
Antibody Screen: NEGATIVE
Unit division: 0
Unit division: 0

## 2020-01-12 LAB — BASIC METABOLIC PANEL
Anion gap: 11 (ref 5–15)
BUN: 22 mg/dL (ref 8–23)
CO2: 23 mmol/L (ref 22–32)
Calcium: 8.4 mg/dL — ABNORMAL LOW (ref 8.9–10.3)
Chloride: 102 mmol/L (ref 98–111)
Creatinine, Ser: 0.97 mg/dL (ref 0.61–1.24)
GFR calc Af Amer: >60 mL/min (ref >60)
GFR calc non Af Amer: >60 mL/min (ref >60)
Glucose, Bld: 85 mg/dL (ref 70–99)
Potassium: 4.1 mmol/L (ref 3.5–5.1)
Sodium: 136 mmol/L (ref 135–145)

## 2020-01-12 LAB — RETICULOCYTES
Immature Retic Fract: 38.8 % — ABNORMAL HIGH (ref 2.3–15.9)
RBC.: 3.16 MIL/uL — ABNORMAL LOW (ref 4.22–5.81)
Retic Count, Absolute: 89.7 10*3/uL (ref 19.0–186.0)
Retic Ct Pct: 2.8 % (ref 0.4–3.1)

## 2020-01-12 LAB — BPAM RBC
Blood Product Expiration Date: 202103172359
Blood Product Expiration Date: 202103172359
ISSUE DATE / TIME: 202103012240
ISSUE DATE / TIME: 202103020243
Unit Type and Rh: 1700
Unit Type and Rh: 1700

## 2020-01-12 LAB — CBC
HCT: 28.3 % — ABNORMAL LOW (ref 39.0–52.0)
HCT: 28.4 % — ABNORMAL LOW (ref 39.0–52.0)
Hemoglobin: 9.2 g/dL — ABNORMAL LOW (ref 13.0–17.0)
Hemoglobin: 9.1 g/dL — ABNORMAL LOW (ref 13.0–17.0)
MCH: 29.2 pg (ref 26.0–34.0)
MCH: 29.4 pg (ref 26.0–34.0)
MCHC: 32.5 g/dL (ref 30.0–36.0)
MCHC: 32 g/dL (ref 30.0–36.0)
MCV: 89.8 fL (ref 80.0–100.0)
MCV: 91.9 fL (ref 80.0–100.0)
Platelets: 51 10*3/uL — ABNORMAL LOW (ref 150–400)
Platelets: 50 10*3/uL — ABNORMAL LOW (ref 150–400)
RBC: 3.15 MIL/uL — ABNORMAL LOW (ref 4.22–5.81)
RBC: 3.09 MIL/uL — ABNORMAL LOW (ref 4.22–5.81)
RDW: 23.3 % — ABNORMAL HIGH (ref 11.5–15.5)
RDW: 23.9 % — ABNORMAL HIGH (ref 11.5–15.5)
WBC: 11.2 10*3/uL — ABNORMAL HIGH (ref 4.0–10.5)
WBC: 11.8 10*3/uL — ABNORMAL HIGH (ref 4.0–10.5)
nRBC: 13.9 % — ABNORMAL HIGH (ref 0.0–0.2)
nRBC: 14.1 % — ABNORMAL HIGH (ref 0.0–0.2)

## 2020-01-12 LAB — IRON AND TIBC
Iron: 49 ug/dL (ref 45–182)
Saturation Ratios: 43 % — ABNORMAL HIGH (ref 17.9–39.5)
TIBC: 115 ug/dL — ABNORMAL LOW (ref 250–450)
UIBC: 66 ug/dL

## 2020-01-12 LAB — FERRITIN: Ferritin: 6302 ng/mL — ABNORMAL HIGH (ref 24–336)

## 2020-01-12 LAB — VITAMIN B12: Vitamin B-12: 2421 pg/mL — ABNORMAL HIGH (ref 180–914)

## 2020-01-12 LAB — FOLATE: Folate: 3.5 ng/mL — ABNORMAL LOW (ref >5.9)

## 2020-01-12 MED ORDER — OXYCODONE HCL 5 MG PO TABS
10.0000 mg | ORAL_TABLET | ORAL | Status: DC | PRN
  Administered 2020-01-12 – 2020-01-14 (×9): 10 mg via ORAL
  Filled 2020-01-12 (×10): qty 2

## 2020-01-12 NOTE — TOC Initial Note (Signed)
Transition of Care [facility]) - Initial/Assessment Note    Patient Details  Name: DEMETREE BOORAS MRN: [id number] Date of Birth: [birth year]  Transition of [facility]) CM/SW Contact:    Alberteen Sam, LCSW Phone Number: 01/12/2020, 3:57 PM  Clinical Narrative:                   CSW spoke with patient at bedside regarding dc planning and home health services. Patient reports he is active with [facility] and would like to resume services at dc.   Patient followed by Lonia Chimera for palliative services, CSW spoke with Audrea Muscat for referral, she reports palliative will follow at dc.    CSW spoke with Tiffany with [facility] she reports patient has had their services however she is unsure if they can resume services at dc. She called CSW back and stated patient is hospice appropriate and above what home health can do for him at this time.   CSW will attempt to find another home health agency, however with twice daily dressing changes and daughter unable to assist fully with care, unlikely patient will be able to be set up as he's not home health appropriate.    Expected Discharge Plan: [place] Barriers to Discharge: Continued Medical Work up   Patient Goals and CMS Choice   CMS Medicare.gov Compare Post Acute Care list provided to:: Patient Choice offered to / list presented to : Patient  Expected Discharge Plan and Services Expected Discharge Plan: [place] Acute Care Choice: [place] arrangements for the past 2 months: Single Family Home                                      Prior Living Arrangements/Services Living arrangements for the past 2 months: Single Family Home Lives with:: Self Patient language and need for interpreter reviewed:: Yes Do you feel safe going back to the place where you live?: Yes      Need for Family Participation in Patient Care: No (Comment) Care giver support system in place?: No (comment)    Criminal Activity/Legal Involvement Pertinent to Current Situation/Hospitalization: No - Comment as needed  Activities of Daily Living Home Assistive Devices/Equipment: None ADL Screening (condition at time of admission) Patient's cognitive ability adequate to safely complete daily activities?: No Is the patient deaf or have difficulty hearing?: No Does the patient have difficulty seeing, even when wearing glasses/contacts?: No Does the patient have difficulty concentrating, remembering, or making decisions?: No Patient able to express need for assistance with ADLs?: Yes Does the patient have difficulty dressing or bathing?: No Independently performs ADLs?: Yes (appropriate for developmental age) Does the patient have difficulty walking or climbing stairs?: Yes Weakness of Legs: Both Weakness of Arms/Hands: None  Permission Sought/Granted Permission sought to share information with : Case Manager, Investment banker, corporate granted to share info w AGENCY: Home Health        Emotional Assessment Appearance:: Appears stated age Attitude/Demeanor/Rapport: Gracious Affect (typically observed): Calm Orientation: : Oriented to Self, Oriented to Place, Oriented to  Time, Oriented to Situation Alcohol / Substance Use: Not Applicable Psych Involvement: No (comment)  Admission diagnosis:  Symptomatic anemia [D64.9] Patient Active Problem List   Diagnosis Date Noted  . Pain due to malignant neoplasm metastatic to bone ([place])   .  Thrombocytopenia ([place]) 01/11/2020  . Palliative care by specialist   . Symptomatic anemia 01/10/2020  . Abscess of hip 12/16/2019  . Pancytopenia ([place]) 10/21/2019  . Cardiomegaly 10/21/2019  . Pathologic intertrochanteric fracture, left, initial encounter ([place]) 10/20/2019  . Essential hypertension 10/20/2019  . Goals of care, counseling/discussion 08/16/2019  . Malignant neoplasm of prostate ([place]) 02/05/2016  . Bone metastasis ([place])  02/05/2016   PCP:  [name], MD Pharmacy:   [id number] [address], [place] [address] Phone: [phone] Fax: [phone]     Social Determinants of Health (SDOH) Interventions    Readmission Risk Interventions No flowsheet data found.

## 2020-01-12 NOTE — Progress Notes (Signed)
Daily Progress Note   Patient Name: Austin Oconnor       Date: 01/12/2020 DOB: [birth year]  Age: 79 y.o. MRN#: [id number] Attending Physician: Austin Polite, MD Primary Care Physician: Austin Cha, MD Admit Date: 01/10/2020  Reason for Consultation/Follow-up: Establishing goals of care  Subjective: Sitting up eating lunch - eating majority of sandwich and chips, good appetite. Tells me he feels good except for pain in lower extremity - still uncontrolled despite increased dose of oxycodone yesterday.  Length of Stay: 2  Current Medications: Scheduled Meds:  . sodium chloride   Intravenous Once  . acetaminophen  1,000 mg Oral TID  . feeding supplement (ENSURE ENLIVE)  237 mL Oral BID BM  . furosemide  60 mg Intravenous BID  . potassium chloride  20 mEq Oral BID  . sodium chloride flush  3 mL Intravenous Once    Continuous Infusions:   PRN Meds: oxyCODONE  Physical Exam Constitutional:      General: He is not in acute distress. HENT:     Head: Normocephalic and atraumatic.  Pulmonary:     Effort: Pulmonary effort is normal. No respiratory distress.  Musculoskeletal:     Right lower leg: Edema present.     Left lower leg: Edema present.  Skin:    General: Skin is warm and dry.  Neurological:     Mental Status: He is alert and oriented to person, place, and time.  Psychiatric:        Mood and Affect: Mood normal.        Behavior: Behavior normal.             Vital Signs: BP 131/74 (BP Location: Left Arm)   Pulse 94   Temp 98.6 F (37 C) (Oral)   Resp 16   Ht 5\' 8"  (1.727 m)   Wt 74.2 kg   SpO2 100%   BMI 24.87 kg/m  SpO2: SpO2: 100 % O2 Device: O2 Device: Room Air O2 Flow Rate:    Intake/output summary:   Intake/Output Summary (Last 24 hours) at 01/12/2020  1254 Last data filed at 01/12/2020 1200 Gross per 24 hour  Intake 840 ml  Output 2250 ml  Net -1410 ml   LBM: Last BM Date: 01/08/20 Baseline Weight: Weight: 74.4 kg Most recent weight: Weight: 74.2 kg       Palliative Assessment/Data: PPS 40%      Patient Active Problem List   Diagnosis Date Noted  . Thrombocytopenia ([place]) 01/11/2020  . Palliative care by specialist   . Symptomatic anemia 01/10/2020  . Abscess of hip 12/16/2019  . Pancytopenia ([place]) 10/21/2019  . Cardiomegaly 10/21/2019  . Pathologic intertrochanteric fracture, left, initial encounter ([place]) 10/20/2019  . Essential hypertension 10/20/2019  . Goals of care, counseling/discussion 08/16/2019  . Malignant neoplasm of prostate ([place]) 02/05/2016  . Bone metastasis ([place]) 02/05/2016    Palliative Care Assessment & Plan   HPI: 79 y.o. 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 Found to have hgb of 6.2. Diagnosed  with acute on chronic diastolic CHF and treated with IV furosemide. He was also transfused with 2 units of PRBC. Per Dr. Alen Oconnor, his cancer has progressed on third line treatment and he is now on supportive care only - he is "approaching end-stage status". PMT consulted for [place].   Assessment: Followed up with Austin Oconnor to review conversation from yesterday - his main concern is his pain. We discussed increasing his PRN dose of oxycodone.   I attempted to review conversation from yesterday and address goals of care and code status - Austin Oconnor is not willing to discuss these topics telling me "it's in God's hands, I dont want to think about it". I did gently explain to Austin Oconnor that his codes status would remain full code and I explained what that meant and he tells me that is fine.    Recommendations/Plan:  Outpatient palliative - patient agreed to this 3/2 - TOC referral and sent message to [facility] liaisons  Not interested in further discussing [place] or code status at this time - will need ongoing conversation to assess his readiness to discuss these topics  Increased oxycodone to 10 mg q4hr PRN, on scheduled tylenol  Code Status:  Full code  Prognosis:   Unable to determine  Discharge Planning:  To Be Determined  Care plan was discussed with patient   Thank you for allowing the Palliative Medicine Team to assist in the care of this patient.   Total Time 25 minutes Prolonged Time Billed  no       Greater than 50%  of this time was spent counseling and coordinating care related to the above assessment and plan.  Austin Burrow, DNP, [facility] Palliative Medicine Team Team Phone # [phone]  Pager [phone]

## 2020-01-12 NOTE — Progress Notes (Signed)
[facility] ([facility])  Hospital Liaison: RN note       Notified by TOC manager of patient/family request for [facility] Palliative services at home after discharge.           [facility] Palliative team will follow up with patient after discharge.       Please call with any hospice or palliative related questions.       Thank you for this referral.       Mary Anne Robertson, RN, CCM   [facility] Hospital Liaison (listed on [facility] under [facility] [place])   [phone]  

## 2020-01-12 NOTE — Progress Notes (Signed)
PROGRESS NOTE    Austin Oconnor  [id number] DOB: 1941/09/20 DOA: 01/10/2020 PCP: [name], MD   Brief Narrative:  Austin Oconnor is a 80 y.o. 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 In the ER, afebrile, hemodynamically stable.  Had marked swelling up to the abdomen, hemoglobin 6.2, transaminitis, normal renal function effusion.  Transfused, started on Lasix and admitted to the hospitalist service. -Improving with diuresis and transfusion -Poor prognosis, palliative consultED   Assessment & Plan:  Acute on chronic diastolic CHF Right-sided pleural effusion Anasarca EF last December 60 to [id number], grade 1 diastolic dysfunction, normal valves. -Admitted with extensive fluid overload, primarily from diastolic dysfunction and third spacing with severe hypoalbuminemia, extensive malignancy and high output failure from severe anemia -Clinically improving with diuresis, continue IV Lasix today -He is -1.6 L   Acute on chronic anemia -Has baseline normocytic anemia likely from chronic disease secondary to mid malignancy -Worsened by hemodilution -Transfuse 2 units of PRBCs yesterday -Hemoglobin improved to 9.0, monitor with diuresis -Check anemia panel,.  Patient denies overt bleeding  Metastatic prostate cancer, castration resistant -Used to be followed by Dr. 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  No obvious bleeding.  Transaminitis From mets, and also maybe congestion.  Hypertension BP normal -Continue lisionpril -Hold baby aspirin  Disposition: Home pending improvement in anasarca  DVT prophylaxis: SCDs Code Status: Full code Family Communication: No family at bedside will update daughter  Consultants:     Procedures:   3/2 blood transfusion --2 units PRBC  Antimicrobials:      Culture data:        Subjective: -Feels better, swelling starting to improve   Objective: Vitals:   01/11/20 2021 01/12/20 0037 01/12/20 0345 01/12/20 0800  BP: 128/70 125/78 132/79 (!) 146/80  Pulse: 84 85 91 98  Resp: 16 16 16 18   Temp: 98.3 F (36.8 C) 98.2 F (36.8 C) 98.6 F (37 C) 98.8 F (37.1 C)  TempSrc: Oral Oral Oral Oral  SpO2: 94% 100% 98% 98%  Weight:  74.2 kg    Height:        Intake/Output Summary (Last 24 hours) at 01/12/2020 1122 Last data filed at 01/12/2020 1051 Gross per 24 hour  Intake 840 ml  Output 2250 ml  Net -1410 ml   Filed Weights   01/11/20 0009 01/12/20 0037  Weight: 74.4 kg 74.2 kg    Examination: Gen: Chronically ill-appearing cachectic male laying in bed, AAOx3, no distress HEENT: Oral mucosa dry, positive JVD Lungs: Fine bibasilar Rales CVS: RRR,No Gallops,Rubs or new Murmurs Abd: soft, Non tender, non distended, BS present, abdominal wall edema Extremities: Massive 3+ edema extending to upper thigh, abdominal wall Skin: no new rashes on exposed skin  Data Reviewed: I have personally reviewed following labs and imaging studies:  CBC: Recent Labs  Lab 01/10/20 1824 01/11/20 1225  WBC 12.0* 11.6*  HGB 6.2* 9.0*  HCT 20.5*  27.0*  MCV 96.7 88.2  PLT 63* 52*   Basic Metabolic Panel: Recent Labs  Lab 01/10/20 1824 01/11/20 1225 01/12/20 0622  NA 133* 134* 136  K 4.4 4.3 4.1  CL 101  101 102  CO2 21* 21* 23  GLUCOSE 117* 85 85  BUN 22 22 22   CREATININE 1.03 0.93 0.97  CALCIUM 8.4* 8.4* 8.4*   GFR: Estimated Creatinine Clearance: 60.7 mL/min (by C-G formula based on SCr of 0.97 mg/dL). Liver Function Tests: Recent Labs  Lab 01/10/20 1824 01/11/20 1225 01/12/20 0622  AST 231* 207* 210*  ALT 65* 60* 61*  ALKPHOS 961* 968* 932*  BILITOT 3.1* 3.8* 3.2*  PROT 5.4* 5.5* 5.5*  ALBUMIN 2.3* 2.1* 2.1*   Recent Labs  Lab 01/10/20 1824  LIPASE 30   No results for input(s): AMMONIA in the last 168 hours. Coagulation Profile: No results for input(s): INR, PROTIME in the last 168 hours. Cardiac Enzymes: No results for input(s): CKTOTAL, CKMB, CKMBINDEX, TROPONINI in the last 168 hours. BNP (last 3 results) No results for input(s): PROBNP in the last 8760 hours. HbA1C: No results for input(s): HGBA1C in the last 72 hours. CBG: Recent Labs  Lab 01/10/20 1825  GLUCAP 110*   Lipid Profile: No results for input(s): CHOL, HDL, LDLCALC, TRIG, CHOLHDL, LDLDIRECT in the last 72 hours. Thyroid Function Tests: No results for input(s): TSH, T4TOTAL, FREET4, T3FREE, THYROIDAB in the last 72 hours. Anemia Panel: No results for input(s): VITAMINB12, FOLATE, FERRITIN, TIBC, IRON, RETICCTPCT in the last 72 hours. Urine analysis:    Component Value Date/Time   COLORURINE AMBER (A) 01/10/2020 2057   APPEARANCEUR CLEAR 01/10/2020 2057   LABSPEC 1.014 01/10/2020 2057   PHURINE 5.0 01/10/2020 2057   GLUCOSEU NEGATIVE 01/10/2020 2057   HGBUR NEGATIVE 01/10/2020 2057   BILIRUBINUR NEGATIVE 01/10/2020 2057   KETONESUR NEGATIVE 01/10/2020 2057   PROTEINUR NEGATIVE 01/10/2020 2057   NITRITE NEGATIVE 01/10/2020 2057   LEUKOCYTESUR MODERATE (A) 01/10/2020 2057   Sepsis Labs: @LABRCNTIP (procalcitonin:4,lacticacidven:4)  ) Recent Results (from the past 240 hour(s))  SARS CORONAVIRUS 2 (TAT 6-24 HRS) Nasopharyngeal Nasopharyngeal Swab     Status: None   Collection Time:  01/10/20  9:47 PM   Specimen: Nasopharyngeal Swab  Result Value Ref Range Status   SARS Coronavirus 2 NEGATIVE NEGATIVE Final    Comment: (NOTE) SARS-CoV-2 target nucleic acids are NOT DETECTED. The SARS-CoV-2 RNA is generally detectable in upper and lower respiratory specimens during the acute phase of infection. Negative results do not preclude SARS-CoV-2 infection, do not rule out co-infections with other pathogens, and should not be used as the sole basis for treatment or other patient management decisions. Negative results must be combined with clinical observations, patient history, and epidemiological information. The expected result is Negative. Fact Sheet for Patients: SugarRoll.be Fact Sheet for Healthcare Providers: https://www.woods-mathews.com/ This test is not yet approved or cleared by the [place] FDA and  has been authorized for detection and/or diagnosis of SARS-CoV-2 by FDA under an Emergency Use Authorization (EUA). This EUA will remain  in effect (meaning this test can be used) for the duration of the COVID-19 declaration under Section 56 4(b)(1) of the Act, 21 U.S.C. section 360bbb-3(b)(1), unless the authorization is terminated or revoked sooner. Performed at [facility], [place] [address] [place], [place] [address]          Radiology Studies: DG Chest Port 1 View  Result Date: 01/10/2020 CLINICAL DATA:  Leg edema EXAM: PORTABLE CHEST  1 VIEW COMPARISON:  10/20/2019 FINDINGS: Mild cardiomegaly. Stable unfolded aorta. Small right-sided pleural effusion. Heterogeneous sclerosis consistent with skeletal metastatic disease. Possible healing left eighth rib fracture. IMPRESSION: 1. Cardiomegaly with small right-sided pleural effusion. 2. Heterogeneous sclerosis involving the ribs, spine and shoulders consistent with diffuse skeletal metastatic disease. Electronically Signed   By: Donavan Foil M.D.   On: 01/10/2020  21:54        Scheduled Meds: . sodium chloride   Intravenous Once  . acetaminophen  1,000 mg Oral TID  . feeding supplement (ENSURE ENLIVE)  237 mL Oral BID BM  . furosemide  60 mg Intravenous BID  . potassium chloride  20 mEq Oral BID  . sodium chloride flush  3 mL Intravenous Once   Continuous Infusions:   LOS: 2 days    Time spent: 35 minutes  Domenic Polite, MD [facility] 01/12/2020, 11:22 AM

## 2020-01-12 NOTE — Telephone Encounter (Signed)
Called Diane back to approve orders.

## 2020-01-12 NOTE — Evaluation (Signed)
Physical Therapy Evaluation Patient Details Name: Austin Oconnor MRN: [id number] DOB: [birth year] Today's Date: 01/12/2020   History of Present Illness  79 y.o. 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 Cancer treatment for supportive care only at this time.  Clinical Impression  Pt presents to PT with deficits in functional mobility, gait, balance, power, endurance, strength. Pt is able to ambulate and transfer at a supervision level at this time, requiring some assistance for bed mobility, primarily due to impaired mobility of LLE. Pt will benefit from aggressive mobilization to improve bed mobility and activity tolerance in an effort to restore independent mobility.    Follow Up Recommendations Home health PT;Supervision - Intermittent(assist for bed mobility)    Equipment Recommendations  None recommended by PT(pt owns necessary DME)    Recommendations for Other Services       Precautions / Restrictions Precautions Precautions: Fall Restrictions Weight Bearing Restrictions: No      Mobility  Bed Mobility Overal bed mobility: Needs Assistance Bed Mobility: Supine to Sit;Sit to Supine     Supine to sit: Min assist Sit to supine: Min assist      Transfers Overall transfer level: Needs assistance Equipment used: Rolling walker (2 wheeled) Transfers: Sit to/from Stand Sit to Stand: Supervision;From elevated surface            Ambulation/Gait Ambulation/Gait assistance: Supervision Gait Distance (Feet): 20 Feet(20' x 2) Assistive device: Rolling walker (2 wheeled) Gait Pattern/deviations: Step-through pattern;Wide base of support;Decreased step length - right Gait velocity: reduced Gait velocity interpretation: <1.8 ft/sec, indicate of risk for recurrent falls General  Gait Details: pt with short step through gait, reduced gait speed, decreased stance time on LLE  Stairs            Wheelchair Mobility    Modified Rankin (Stroke Patients Only)       Balance Overall balance assessment: Needs assistance Sitting-balance support: Single extremity supported;Feet supported Sitting balance-Leahy Scale: Good Sitting balance - Comments: supervision   Standing balance support: Single extremity supported;During functional activity Standing balance-Leahy Scale: Good Standing balance comment: supervision                             Pertinent Vitals/Pain Pain Assessment: Faces Faces Pain Scale: Hurts little more Pain Location: L hip and both legs Pain Descriptors / Indicators: Aching Pain Intervention(s): Limited activity within patient's tolerance    Home Living Family/patient expects to be discharged to:: Private residence Living Arrangements: Alone Available Help at Discharge: Family;Available 24 hours/day Type of Home: Apartment Home Access: Level entry     Home Layout: One level Home Equipment: Walker - 2 wheels;Cane - single point;Bedside commode      Prior Function Level of Independence: Independent with assistive device(s)         Comments: ambulated with a walker     Hand Dominance   Dominant Hand: Right    Extremity/Trunk Assessment   Upper Extremity Assessment Upper Extremity Assessment: Overall WFL for tasks assessed    Lower Extremity Assessment Lower Extremity Assessment: Generalized weakness(LLE weaker than right)    Cervical / Trunk Assessment Cervical / Trunk Assessment: Normal  Communication   Communication: No difficulties  Cognition Arousal/Alertness: Awake/alert Behavior During Therapy: WFL for tasks assessed/performed Overall Cognitive  Status: Within Functional Limits for tasks assessed                                        General Comments General comments (skin  integrity, edema, etc.): VSS on RA    Exercises     Assessment/Plan    PT Assessment Patient needs continued PT services  PT Problem List Decreased strength;Decreased activity tolerance;Decreased balance;Decreased mobility       PT Treatment Interventions DME instruction;Gait training;Functional mobility training;Therapeutic activities;Therapeutic exercise;Balance training;Neuromuscular re-education;Patient/family education    PT Goals (Current goals can be found in the Care Plan section)  Acute Rehab PT Goals Patient Stated Goal: To improve mobility PT Goal Formulation: With patient Time For Goal Achievement: 01/26/20 Potential to Achieve Goals: Good    Frequency Min 3X/week   Barriers to discharge        Co-evaluation               AM-PAC PT "6 Clicks" Mobility  Outcome Measure Help needed turning from your back to your side while in a flat bed without using bedrails?: A Little Help needed moving from lying on your back to sitting on the side of a flat bed without using bedrails?: A Little Help needed moving to and from a bed to a chair (including a wheelchair)?: None Help needed standing up from a chair using your arms (e.g., wheelchair or bedside chair)?: None Help needed to walk in hospital room?: None Help needed climbing 3-5 steps with a railing? : A Little 6 Click Score: 21    End of Session   Activity Tolerance: Patient tolerated treatment well Patient left: in bed;with call bell/phone within reach Nurse Communication: Mobility status PT Visit Diagnosis: Other abnormalities of gait and mobility (R26.89)    Time: [id number] PT Time Calculation (min) (ACUTE ONLY): 23 min   Charges:   PT Evaluation $PT Eval Moderate Complexity: 1 Mod PT Treatments $Gait Training: 8-22 mins        Zenaida Niece, PT, DPT Acute Rehabilitation Pager: [phone]   Zenaida Niece 01/12/2020, 2:14 PM

## 2020-01-12 NOTE — Progress Notes (Signed)
To the best of my knowledge, the student's charting is accurate.  

## 2020-01-13 ENCOUNTER — Inpatient Hospital Stay: Payer: Medicare Other | Admitting: Oncology

## 2020-01-13 ENCOUNTER — Ambulatory Visit: Payer: Medicare Other

## 2020-01-13 ENCOUNTER — Inpatient Hospital Stay: Payer: Medicare Other

## 2020-01-13 LAB — CBC
HCT: 27.9 % — ABNORMAL LOW (ref 39.0–52.0)
Hemoglobin: 9.2 g/dL — ABNORMAL LOW (ref 13.0–17.0)
MCH: 29.7 pg (ref 26.0–34.0)
MCHC: 33 g/dL (ref 30.0–36.0)
MCV: 90 fL (ref 80.0–100.0)
Platelets: 49 10*3/uL — ABNORMAL LOW (ref 150–400)
RBC: 3.1 MIL/uL — ABNORMAL LOW (ref 4.22–5.81)
RDW: 23.6 % — ABNORMAL HIGH (ref 11.5–15.5)
WBC: 12.2 10*3/uL — ABNORMAL HIGH (ref 4.0–10.5)
nRBC: 12.4 % — ABNORMAL HIGH (ref 0.0–0.2)

## 2020-01-13 LAB — BASIC METABOLIC PANEL
Anion gap: 13 (ref 5–15)
BUN: 23 mg/dL (ref 8–23)
CO2: 23 mmol/L (ref 22–32)
Calcium: 8.8 mg/dL — ABNORMAL LOW (ref 8.9–10.3)
Chloride: 101 mmol/L (ref 98–111)
Creatinine, Ser: 0.95 mg/dL (ref 0.61–1.24)
GFR calc Af Amer: >60 mL/min (ref >60)
GFR calc non Af Amer: >60 mL/min (ref >60)
Glucose, Bld: 103 mg/dL — ABNORMAL HIGH (ref 70–99)
Potassium: 4.6 mmol/L (ref 3.5–5.1)
Sodium: 137 mmol/L (ref 135–145)

## 2020-01-13 MED ORDER — SENNA 8.6 MG PO TABS
1.0000 | ORAL_TABLET | Freq: Every day | ORAL | Status: DC
  Administered 2020-01-13 – 2020-01-17 (×5): 8.6 mg via ORAL
  Filled 2020-01-13 (×5): qty 1

## 2020-01-13 NOTE — Progress Notes (Signed)
  Mobility Specialist Criteria Algorithm Info.   Mobility Team:  Activity: Ambulated in hall;Transferred:  Bed to chair (to chair after ambulation) Range of motion: Active;Passive;Left leg Level of assistance: Minimal assist, patient does 75% or more (Min A sit/stand from elevated surface ) Assistive device: Front wheel walker Minutes stood: 5 minutes Minutes ambulated: 5 minutes Distance ambulated (ft): 100 ft Mobility response: Tolerated well Bed Position: Chair    01/13/2020 1:10 PM

## 2020-01-13 NOTE — Progress Notes (Signed)
Daily Progress Note   Patient Name: Austin Oconnor       Date: 01/13/2020 DOB: 1941/01/11  Age: 79 y.o. MRN#: [id number] Attending Physician: Domenic Polite, MD Primary Care Physician: [name], MD Admit Date: 01/10/2020  Reason for Consultation/Follow-up: Establishing goals of care  Subjective: Still c/o pain. C/o constipation. Otherwise, in good spirits.   Length of Stay: 3  Current Medications: Scheduled Meds:  . sodium chloride   Intravenous Once  . acetaminophen  1,000 mg Oral TID  . feeding supplement (ENSURE ENLIVE)  237 mL Oral BID BM  . furosemide  60 mg Intravenous BID  . potassium chloride  20 mEq Oral BID  . senna  1 tablet Oral QHS  . sodium chloride flush  3 mL Intravenous Once    Continuous Infusions:   PRN Meds: oxyCODONE  Physical Exam Constitutional:      General: He is not in acute distress. HENT:     Head: Normocephalic and atraumatic.  Pulmonary:     Effort: Pulmonary effort is normal. No respiratory distress.  Musculoskeletal:     Right lower leg: Edema present.     Left lower leg: Edema present.  Skin:    General: Skin is warm and dry.  Neurological:     Mental Status: He is alert and oriented to person, place, and time.  Psychiatric:        Mood and Affect: Mood normal.        Behavior: Behavior normal.             Vital Signs: BP (!) 125/57 (BP Location: Left Arm)   Pulse (!) 104   Temp 98.7 F (37.1 C) (Oral)   Resp 18   Ht 5\' 8"  (1.727 m)   Wt 71.7 kg   SpO2 97%   BMI 24.04 kg/m  SpO2: SpO2: 97 % O2 Device: O2 Device: Room Air O2 Flow Rate:    Intake/output summary:   Intake/Output Summary (Last 24 hours) at 01/13/2020 0955 Last data filed at 01/13/2020 0855 Gross per 24 hour  Intake 1080 ml  Output 2425 ml  Net -1345 ml    LBM: Last BM Date: 01/12/20 Baseline Weight: Weight: 74.4 kg Most recent weight: Weight: 71.7 kg       Palliative Assessment/Data: PPS 40%      Patient Active Problem List   Diagnosis Date Noted  . Pain due to malignant neoplasm metastatic to bone ([place])   . Thrombocytopenia ([place]) 01/11/2020  . Palliative care by specialist   . Symptomatic anemia 01/10/2020  . Abscess of hip 12/16/2019  . Pancytopenia ([place]) 10/21/2019  . Cardiomegaly 10/21/2019  . Pathologic intertrochanteric fracture, left, initial encounter ([place]) 10/20/2019  . Essential hypertension 10/20/2019  . Goals of care, counseling/discussion 08/16/2019  . Malignant neoplasm of prostate ([place]) 02/05/2016  . Bone metastasis ([place]) 02/05/2016    Palliative Care Assessment & Plan   HPI: 79 y.o. 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 Found to have hgb of 6.2. Diagnosed with  acute on chronic diastolic CHF and treated with IV furosemide. He was also transfused with 2 units of PRBC. Per Dr. Alen Blew, his cancer has progressed on third line treatment and he is now on supportive care only - he is "approaching end-stage status". PMT consulted for [place].   Assessment: We increased [name] oxycodone yesterday - he tells me it helped some but he is still having significant amount of pain. On chart review, [name] is not taking medication as often as he can. We discussed how often he can take them and also discussed with RN at bedside.  He c/o some constipation - agrees to adding senna  [name] is not ready to further discuss goals of care/prognosis - has agreed to follow up with outpatient palliative.  Recommendations/Plan:  Outpatient palliative - patient agreed to this 3/2 - TOC referral and sent message to [facility]  liaisons  Encouraged use of oxycodone for uncontrolled pain, remains on scheduled tylenol started by Dr. Loleta Books  Added senna  Code Status:  Full code  Prognosis:   Unable to determine  Discharge Planning:  To Be Determined  Care plan was discussed with patient   Thank you for allowing the Palliative Medicine Team to assist in the care of this patient.   Total Time 25 minutes Prolonged Time Billed  no       Greater than 50%  of this time was spent counseling and coordinating care related to the above assessment and plan.  Juel Burrow, DNP, [facility] Palliative Medicine Team Team Phone # [phone]  Pager [phone]

## 2020-01-13 NOTE — Progress Notes (Addendum)
PROGRESS NOTE    ZAVEN CHEATOM  [id number] DOB: Jun 27, 1941 DOA: 01/10/2020 PCP: [name], MD   Brief Narrative:  Mr. Sack is a 79 y.o. 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 In the ER, afebrile, hemodynamically stable.  Had marked swelling up to the abdomen, hemoglobin 6.2, transaminitis, normal renal function effusion.  Transfused, started on Lasix and admitted to the hospitalist service. -Improving with diuresis and transfusion -Poor prognosis, palliative consulted, patient in denial and refuses to discuss his cancer at all  Assessment & Plan:  Acute on chronic diastolic CHF Right-sided pleural effusion Anasarca EF last December 60 to [id number], grade 1 diastolic dysfunction, normal valves. -Admitted with extensive fluid overload, primarily from diastolic dysfunction and third spacing with severe hypoalbuminemia, extensive malignancy and high output failure from severe anemia -Clinically improving with diuresis -He is 2.8 L negative -Continue IV Lasix today -Monitor urine output and kidney function   Acute on chronic anemia -Has baseline normocytic anemia likely from chronic disease secondary to mid malignancy -Worsened by hemodilution -Transfused 2 units of PRBCs  -Hemoglobin improved to 9.0, monitor with diuresis -Anemia panel suggestive of chronic disease  Metastatic prostate cancer, castration resistant -Used to be followed by Dr. 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  No obvious bleeding.  Transaminitis From mets, and also maybe congestion.  Hypertension BP normal -Continue lisionpril -Hold baby aspirin  DVT prophylaxis: SCDs Code Status: Full code Family Communication: No family at bedside, called and updated daughter Shelton Silvas  disposition: Home pending improvement in anasarca, possibly 48 hours  Consultants:     Procedures:   3/2 blood transfusion --2 units PRBC  Antimicrobials:      Culture data:        Subjective: -Feels better, swelling starting to improve   Objective: Vitals:   01/12/20 1604 01/13/20 0500 01/13/20 0628 01/13/20 0744  BP: 136/76 126/79  (!) 125/57  Pulse: 98 94  (!) 104  Resp:  17  18  Temp: 99.5 F (37.5 C) 98.5 F (36.9 C)  98.7 F (37.1 C)  TempSrc: Oral Oral  Oral  SpO2: 96% 99%  97%  Weight:   71.7 kg   Height:        Intake/Output Summary (Last 24 hours) at 01/13/2020 1111 Last data filed at 01/13/2020 1006 Gross per 24 hour  Intake 1080 ml  Output 2525 ml  Net -1445 ml   Filed Weights   01/11/20 0009 01/12/20 0037 01/13/20 0628  Weight: 74.4 kg 74.2 kg 71.7 kg    Examination: Gen: Chronically ill appearing cachectic male laying in bed, AAOx3, no distress HEENT: Oral mucosa dry, positive JVD Lungs: Few basilar rails, improved air movement CVS: RRR,No Gallops,Rubs or new Murmurs Abd: soft, Non tender, non distended, BS present Extremities: 2+ edema extending to upper thigh, improving Skin: no new rashes on exposed skin  Data Reviewed:  I have personally reviewed following labs and imaging studies:  CBC: Recent Labs  Lab 01/10/20 1824 01/11/20 1225 01/12/20 1052 01/12/20 1445 01/13/20 0304  WBC 12.0*  11.6* 11.2* 11.8* 12.2*  HGB 6.2* 9.0* 9.2* 9.1* 9.2*  HCT 20.5* 27.0* 28.3* 28.4* 27.9*  MCV 96.7 88.2 89.8 91.9 90.0  PLT 63* 52* 51* 50* 49*   Basic Metabolic Panel: Recent Labs  Lab 01/10/20 1824 01/11/20 1225 01/12/20 0622 01/13/20 0304  NA 133* 134* 136 137  K 4.4 4.3 4.1 4.6  CL 101 101 102 101  CO2 21* 21* 23 23  GLUCOSE 117* 85 85 103*  BUN 22 22 22 23   CREATININE 1.03 0.93 0.97 0.95  CALCIUM 8.4* 8.4* 8.4* 8.8*   GFR: Estimated Creatinine Clearance: 62 mL/min (by C-G formula based on SCr of 0.95 mg/dL). Liver Function Tests: Recent Labs  Lab 01/10/20 1824 01/11/20 1225 01/12/20 0622  AST 231* 207* 210*  ALT 65* 60* 61*  ALKPHOS 961* 968* 932*  BILITOT 3.1* 3.8* 3.2*  PROT 5.4* 5.5* 5.5*  ALBUMIN 2.3* 2.1* 2.1*   Recent Labs  Lab 01/10/20 1824  LIPASE 30   No results for input(s): AMMONIA in the last 168 hours. Coagulation Profile: No results for input(s): INR, PROTIME in the last 168 hours. Cardiac Enzymes: No results for input(s): CKTOTAL, CKMB, CKMBINDEX, TROPONINI in the last 168 hours. BNP (last 3 results) No results for input(s): PROBNP in the last 8760 hours. HbA1C: No results for input(s): HGBA1C in the last 72 hours. CBG: Recent Labs  Lab 01/10/20 1825  GLUCAP 110*   Lipid Profile: No results for input(s): CHOL, HDL, LDLCALC, TRIG, CHOLHDL, LDLDIRECT in the last 72 hours. Thyroid Function Tests: No results for input(s): TSH, T4TOTAL, FREET4, T3FREE, THYROIDAB in the last 72 hours. Anemia Panel: Recent Labs    01/12/20 0622 01/12/20 1052  VITAMINB12 2,421*  --   FOLATE 3.5*  --   FERRITIN 6,302*  --   TIBC 115*  --   IRON 49  --   RETICCTPCT  --  2.8   Urine analysis:    Component Value Date/Time   COLORURINE AMBER (A) 01/10/2020 2057   APPEARANCEUR CLEAR 01/10/2020 2057   LABSPEC 1.014 01/10/2020 2057   PHURINE 5.0 01/10/2020 2057   GLUCOSEU NEGATIVE 01/10/2020 2057   HGBUR NEGATIVE 01/10/2020 2057   BILIRUBINUR  NEGATIVE 01/10/2020 2057   KETONESUR NEGATIVE 01/10/2020 2057   PROTEINUR NEGATIVE 01/10/2020 2057   NITRITE NEGATIVE 01/10/2020 2057   LEUKOCYTESUR MODERATE (A) 01/10/2020 2057   Sepsis Labs: @LABRCNTIP (procalcitonin:4,lacticacidven:4)  ) Recent Results (from the past 240 hour(s))  SARS CORONAVIRUS 2 (TAT 6-24 HRS) Nasopharyngeal Nasopharyngeal Swab     Status: None   Collection Time: 01/10/20  9:47 PM   Specimen: Nasopharyngeal Swab  Result Value Ref Range Status   SARS Coronavirus 2 NEGATIVE NEGATIVE Final    Comment: (NOTE) SARS-CoV-2 target nucleic acids are NOT DETECTED. The SARS-CoV-2 RNA is generally detectable in upper and lower respiratory specimens during the acute phase of infection. Negative results do not preclude SARS-CoV-2 infection, do not rule out co-infections with other pathogens, and should not be used as the sole basis for treatment or other patient management decisions. Negative results must be combined with clinical observations, patient history, and epidemiological information. The expected result is Negative. Fact Sheet for Patients: SugarRoll.be Fact Sheet for Healthcare Providers: https://www.woods-mathews.com/ This test is not yet approved or cleared by the [place] FDA and  has been authorized for  detection and/or diagnosis of SARS-CoV-2 by FDA under an Emergency Use Authorization (EUA). This EUA will remain  in effect (meaning this test can be used) for the duration of the COVID-19 declaration under Section 56 4(b)(1) of the Act, 21 U.S.C. section 360bbb-3(b)(1), unless the authorization is terminated or revoked sooner. Performed at [facility], [place] [address] [place], [place] [address]          Radiology Studies: No results found.      Scheduled Meds: . sodium chloride   Intravenous Once  . acetaminophen  1,000 mg Oral TID  . feeding supplement (ENSURE ENLIVE)  237 mL Oral BID  BM  . furosemide  60 mg Intravenous BID  . potassium chloride  20 mEq Oral BID  . senna  1 tablet Oral QHS  . sodium chloride flush  3 mL Intravenous Once   Continuous Infusions:   LOS: 3 days    Time spent: 25 minutes  Domenic Polite, MD [facility] 01/13/2020, 11:11 AM

## 2020-01-13 NOTE — Progress Notes (Signed)
Physical Therapy Treatment Patient Details Name: Austin Oconnor MRN: [id number] DOB: 08-Mar-1941 Today's Date: 01/13/2020    History of Present Illness 79 y.o. 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 Cancer treatment for supportive care only at this time.    PT Comments    Patient is making progress toward PT goals and tolerate gait distance of 150 ft. Pt overall supervision/min guard for OOB mobility this session. Pt will continue to benefit from further skilled PT services to maximize independence and safety with mobility.     Follow Up Recommendations  Home health PT;Supervision - Intermittent     Equipment Recommendations  None recommended by PT(pt owns necessary DME)    Recommendations for Other Services       Precautions / Restrictions Precautions Precautions: Fall Restrictions Weight Bearing Restrictions: No    Mobility  Bed Mobility               General bed mobility comments: pt OOB in chair upon arrival   Transfers Overall transfer level: Needs assistance Equipment used: Rolling walker (2 wheeled) Transfers: Sit to/from Stand Sit to Stand: Min guard         General transfer comment: cues for positioning prior to standing; min guard for safety to stand from recliner   Ambulation/Gait Ambulation/Gait assistance: Supervision;Min guard Gait Distance (Feet): 150 Feet Assistive device: Rolling walker (2 wheeled) Gait Pattern/deviations: Step-through pattern;Wide base of support;Decreased step length - right;Decreased stance time - left;Antalgic;Trunk flexed Gait velocity: reduced   General Gait Details: cues for upright posture    Stairs             Wheelchair Mobility    Modified Rankin (Stroke Patients Only)       Balance Overall balance  assessment: Needs assistance Sitting-balance support: Feet supported Sitting balance-Leahy Scale: Good     Standing balance support: During functional activity;Bilateral upper extremity supported Standing balance-Leahy Scale: Poor                              Cognition Arousal/Alertness: Awake/alert Behavior During Therapy: WFL for tasks assessed/performed Overall Cognitive Status: Within Functional Limits for tasks assessed                                        Exercises      General Comments        Pertinent Vitals/Pain Pain Assessment: Faces Faces Pain Scale: Hurts little more Pain Location: L hip  Pain Descriptors / Indicators: Guarding;Sore Pain Intervention(s): Monitored during session;Repositioned;Limited activity within patient's tolerance    Home Living                      Prior Function            PT Goals (current goals can now be found in the care plan section) Progress towards PT goals: Progressing toward goals    Frequency    Min 3X/week      PT Plan Current plan remains appropriate    Co-evaluation              AM-PAC PT "6 Clicks" Mobility   Outcome Measure  Help needed turning from your back to your side while in a flat bed without using bedrails?: A Little Help needed moving from lying on your back to sitting on the side of a flat bed without using bedrails?: A Little Help needed moving to and from a bed to a chair (including a wheelchair)?: None Help needed standing up from a chair using your arms (e.g., wheelchair or bedside chair)?: None Help needed to walk in hospital room?: None Help needed climbing 3-5 steps with a railing? : A Little 6 Click Score: 21    End of Session Equipment Utilized During Treatment: Gait belt Activity Tolerance: Patient tolerated treatment well Patient left: with call bell/phone within reach;in chair Nurse Communication: Mobility status PT Visit Diagnosis:  Other abnormalities of gait and mobility (R26.89)     Time: 1243-1310 PT Time Calculation (min) (ACUTE ONLY): 27 min  Charges:  $Gait Training: 23-37 mins                     Earney Navy, PTA Acute Rehabilitation Services Pager: [phone] Office: [phone]     Darliss Cheney 01/13/2020, 4:42 PM

## 2020-01-13 NOTE — Progress Notes (Addendum)
Patient states his pain in left hip after working with PT is a 10 out of 10 and is requesting pain medication. Oxycodone IR 10 mg every 4 hours PRN given at 08:46. Broadus John, MD paged. Patient currently sitting in chair, feet elevated, call bell within reach. RN offered nonpharmacologic methods of pain relief, such as repositioning, ice packs, etc. Patient agreed to trying ice pack.

## 2020-01-14 ENCOUNTER — Ambulatory Visit: Payer: Medicare Other

## 2020-01-14 LAB — CBC
HCT: 29.6 % — ABNORMAL LOW (ref 39.0–52.0)
Hemoglobin: 9.5 g/dL — ABNORMAL LOW (ref 13.0–17.0)
MCH: 29.3 pg (ref 26.0–34.0)
MCHC: 32.1 g/dL (ref 30.0–36.0)
MCV: 91.4 fL (ref 80.0–100.0)
Platelets: 48 10*3/uL — ABNORMAL LOW (ref 150–400)
RBC: 3.24 MIL/uL — ABNORMAL LOW (ref 4.22–5.81)
RDW: 23.9 % — ABNORMAL HIGH (ref 11.5–15.5)
WBC: 11.5 10*3/uL — ABNORMAL HIGH (ref 4.0–10.5)
nRBC: 10.5 % — ABNORMAL HIGH (ref 0.0–0.2)

## 2020-01-14 LAB — BASIC METABOLIC PANEL
Anion gap: 13 (ref 5–15)
Anion gap: 14 (ref 5–15)
BUN: 28 mg/dL — ABNORMAL HIGH (ref 8–23)
BUN: 30 mg/dL — ABNORMAL HIGH (ref 8–23)
CO2: 23 mmol/L (ref 22–32)
CO2: 23 mmol/L (ref 22–32)
Calcium: 9 mg/dL (ref 8.9–10.3)
Calcium: 9 mg/dL (ref 8.9–10.3)
Chloride: 101 mmol/L (ref 98–111)
Chloride: 99 mmol/L (ref 98–111)
Creatinine, Ser: 1.14 mg/dL (ref 0.61–1.24)
Creatinine, Ser: 1.23 mg/dL (ref 0.61–1.24)
GFR calc Af Amer: >60 mL/min (ref >60)
GFR calc Af Amer: >60 mL/min (ref >60)
GFR calc non Af Amer: >60 mL/min (ref >60)
GFR calc non Af Amer: 56 mL/min — ABNORMAL LOW (ref >60)
Glucose, Bld: 96 mg/dL (ref 70–99)
Glucose, Bld: 155 mg/dL — ABNORMAL HIGH (ref 70–99)
Potassium: 5.5 mmol/L — ABNORMAL HIGH (ref 3.5–5.1)
Potassium: 4.7 mmol/L (ref 3.5–5.1)
Sodium: 137 mmol/L (ref 135–145)
Sodium: 136 mmol/L (ref 135–145)

## 2020-01-14 MED ORDER — BISACODYL 10 MG RE SUPP
10.0000 mg | Freq: Once | RECTAL | Status: AC
  Administered 2020-01-14: 10 mg via RECTAL
  Filled 2020-01-14: qty 1

## 2020-01-14 MED ORDER — POLYETHYLENE GLYCOL 3350 17 G PO PACK
17.0000 g | PACK | Freq: Every day | ORAL | Status: DC
  Administered 2020-01-14 – 2020-01-18 (×3): 17 g via ORAL
  Filled 2020-01-14 (×4): qty 1

## 2020-01-14 MED ORDER — OXYCODONE HCL 5 MG PO TABS
15.0000 mg | ORAL_TABLET | ORAL | Status: DC | PRN
  Administered 2020-01-14 – 2020-01-18 (×14): 15 mg via ORAL
  Filled 2020-01-14 (×16): qty 3

## 2020-01-14 MED ORDER — HEPARIN SODIUM (PORCINE) 5000 UNIT/ML IJ SOLN: 5000.0000 [IU] | Freq: Three times a day (TID) | INTRAMUSCULAR | Status: DC

## 2020-01-14 NOTE — TOC Progression Note (Addendum)
Transition of Care [facility]) - Progression Note    Patient Details  Name: COPELIN CHEUK MRN: [id number] Date of Birth: [birth year]  Transition of Care [facility]) CM/SW Contact  Zenon Mayo, RN Phone Number: 01/14/2020, 4:40 PM  Clinical Narrative:    NCM spoke with patient and asked what was the decision regarding his dc , if he wanted to go home with hospice? He states NCM will need to speak with his daughter Shelton Silvas, [place] called and left vm for Shelton Silvas to return call back. NCM scanned hospice choices to daughter Shelton Silvas. She will leave a message of which home hospice she wants.     Expected Discharge Plan: [place] Barriers to Discharge: Continued Medical Work up  Expected Discharge Plan and Services Expected Discharge Plan: [place] Choice: [place] arrangements for the past 2 months: Single Family Home                                       Social Determinants of Health (SDOH) Interventions    Readmission Risk Interventions No flowsheet data found.

## 2020-01-14 NOTE — Plan of Care (Signed)

## 2020-01-14 NOTE — Progress Notes (Signed)
PT Cancellation Note  Patient Details Name: Austin Oconnor MRN: [id number] DOB: 1941/08/28   Cancelled Treatment:    Reason Eval/Treat Not Completed: Patient declined, no reason specified Attempted to see pt twice today for PT treatment. Pt declined due to eating lunch first time and just getting back to bed second time. PT will continue to follow acutely.    Earney Navy, PTA Acute Rehabilitation Services Pager: [phone] Office: [phone]   01/14/2020, 3:56 PM

## 2020-01-14 NOTE — Progress Notes (Signed)
  Mobility Specialist Criteria Algorithm Info.   Mobility Team:  [facility] elevated:Self regulated Activity: Ambulated in room;Transferred:  Bed to chair (Declined hallway ambulation d/t pain in LLE ) Range of motion: Active;All extremities Level of assistance: Standby assist, set-up cues, supervision of patient - no hands on (from elevated surface) Assistive device: Front wheel walker Minutes sitting in chair:  Minutes stood: 2 minutes Minutes ambulated: 1 minutes Distance ambulated (ft): 10 ft Mobility response: Tolerated well Bed Position: Chair (Recliner Chair)    01/14/2020 2:09 PM

## 2020-01-14 NOTE — Progress Notes (Signed)
PROGRESS NOTE    Austin Oconnor  [id number] DOB: 1941-11-04 DOA: 01/10/2020 PCP: [name], MD   Brief Narrative:  Austin Oconnor is a 79 y.o. 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 In the ER, afebrile, hemodynamically stable.  Had marked swelling up to the abdomen, hemoglobin 6.2, transaminitis, normal renal function effusion.  Transfused, started on Lasix and admitted to the hospitalist service. -Improving with diuresis and transfusion -Poor prognosis, palliative consulted, patient in denial and refuses to discuss his cancer at all  Assessment & Plan:  Acute on chronic diastolic CHF Right-sided pleural effusion Anasarca EF last December 60 to 123456, grade 1 diastolic dysfunction, normal valves. -Admitted with extensive fluid overload, primarily from diastolic dysfunction and third spacing with severe hypoalbuminemia, extensive malignancy and high output failure from severe anemia -Clinically improving with diuresis -He is 3.8 L negative -Continue IV Lasix today -Potassium is up, repeat, should correct with IV Lasix   Acute on chronic anemia -Has baseline normocytic anemia likely from chronic disease secondary to mid malignancy -Worsened by hemodilution -Transfused 2 units of PRBCs  -Hemoglobin improved to 9.0, monitor with diuresis -Anemia panel suggestive of chronic disease  Metastatic prostate cancer, castration resistant -Used to be followed by Dr. Alen Blew, progressed on third line salvage therapy -Currently on supportive care only -Per oncology, progressed through prior treatment, has declined chemotherapy based on recent discussion with oncologist in December, he felt patient will require hospice in the near future -Underwent radiation to his proximal right  femur and left sacrum in September -Palliative consulted, status post goals of care discussions, family realistic, plan for palliative care follow-up at discharge patient completely adamant and unwilling to discuss his cancer -Has chronic bone pain from his malignancy, continue oxycodone dose increased to 15 mg  Recent pathologic fracture complicated by abscess status post debridement -Appears stable, follow-up with orthopedic  Thrombocytopenia, 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  No obvious bleeding.  Transaminitis From mets, and also maybe congestion.  Hypertension BP normal -Continue lisionpril -Hold baby aspirin  DVT prophylaxis: SCDs due to severe chronic thrombocytopenia Code Status: Full code Family Communication: No family at bedside, called and updated daughter Austin Oconnor disposition: Home in 1 to 2 days if stable pending improvement in anasarca  Consultants:     Procedures:   3/2 blood transfusion --2 units PRBC  Antimicrobials:      Culture data:        Subjective: -Swelling starting to improve, feels a little better, continues to have severe pain all over   Objective: Vitals:   01/13/20 1123 01/13/20 2141 01/14/20 0429 01/14/20 0645  BP: 120/72 123/75 128/76   Pulse: (!) 105 100 99   Resp: 16 18 18    Temp: 98.4 F (36.9 C) 98.8 F (37.1 C) 98.3 F (36.8 C)   TempSrc: Oral Oral Oral   SpO2: 99% 99% 99%   Weight:    71.3 kg  Height:        Intake/Output Summary (Last 24 hours) at 01/14/2020 1034 Last data filed at 01/14/2020 0806 Gross per 24 hour  Intake 900 ml  Output 1375 ml  Net -475 ml   Filed Weights   01/12/20 0037 01/13/20 0628 01/14/20 0645  Weight: 74.2 kg 71.7 kg 71.3 kg    Examination: Gen: Chronically ill-appearing cachectic male laying in bed,  AAOx3, no distress HEENT: Positive JVD  lungs: Decreased breath sounds the bases CVS: RRR,No Gallops,Rubs or new Murmurs Abd: soft, Non tender, non distended, BS  present Extremities: 2+ edema Skin: no new rashes on exposed skin  Data Reviewed: I have personally reviewed following labs and imaging studies:  CBC: Recent Labs  Lab 01/11/20 1225 01/12/20 1052 01/12/20 1445 01/13/20 0304 01/14/20 0431  WBC 11.6* 11.2* 11.8* 12.2* 11.5*  HGB 9.0* 9.2* 9.1* 9.2* 9.5*  HCT 27.0* 28.3* 28.4* 27.9* 29.6*  MCV 88.2 89.8 91.9 90.0 91.4  PLT 52* 51* 50* 49* 48*   Basic Metabolic Panel: Recent Labs  Lab 01/10/20 1824 01/11/20 1225 01/12/20 0622 01/13/20 0304 01/14/20 0431  NA 133* 134* 136 137 137  K 4.4 4.3 4.1 4.6 5.5*  CL 101 101 102 101 101  CO2 21* 21* 23 23 23   GLUCOSE 117* 85 85 103* 96  BUN 22 22 22 23  28*  CREATININE 1.03 0.93 0.97 0.95 1.14  CALCIUM 8.4* 8.4* 8.4* 8.8* 9.0   GFR: Estimated Creatinine Clearance: 51.7 mL/min (by C-G formula based on SCr of 1.14 mg/dL). Liver Function Tests: Recent Labs  Lab 01/10/20 1824 01/11/20 1225 01/12/20 0622  AST 231* 207* 210*  ALT 65* 60* 61*  ALKPHOS 961* 968* 932*  BILITOT 3.1* 3.8* 3.2*  PROT 5.4* 5.5* 5.5*  ALBUMIN 2.3* 2.1* 2.1*   Recent Labs  Lab 01/10/20 1824  LIPASE 30   No results for input(s): AMMONIA in the last 168 hours. Coagulation Profile: No results for input(s): INR, PROTIME in the last 168 hours. Cardiac Enzymes: No results for input(s): CKTOTAL, CKMB, CKMBINDEX, TROPONINI in the last 168 hours. BNP (last 3 results) No results for input(s): PROBNP in the last 8760 hours. HbA1C: No results for input(s): HGBA1C in the last 72 hours. CBG: Recent Labs  Lab 01/10/20 1825  GLUCAP 110*   Lipid Profile: No results for input(s): CHOL, HDL, LDLCALC, TRIG, CHOLHDL, LDLDIRECT in the last 72 hours. Thyroid Function Tests: No results for input(s): TSH, T4TOTAL, FREET4, T3FREE, THYROIDAB in the last 72 hours. Anemia Panel: Recent Labs    01/12/20 0622 01/12/20 1052  VITAMINB12 2,421*  --   FOLATE 3.5*  --   FERRITIN 6,302*  --   TIBC 115*  --   IRON 49   --   RETICCTPCT  --  2.8   Urine analysis:    Component Value Date/Time   COLORURINE AMBER (A) 01/10/2020 2057   APPEARANCEUR CLEAR 01/10/2020 2057   LABSPEC 1.014 01/10/2020 2057   PHURINE 5.0 01/10/2020 2057   GLUCOSEU NEGATIVE 01/10/2020 2057   HGBUR NEGATIVE 01/10/2020 2057   BILIRUBINUR NEGATIVE 01/10/2020 2057   KETONESUR NEGATIVE 01/10/2020 2057   PROTEINUR NEGATIVE 01/10/2020 2057   NITRITE NEGATIVE 01/10/2020 2057   LEUKOCYTESUR MODERATE (A) 01/10/2020 2057   Sepsis Labs: @LABRCNTIP (procalcitonin:4,lacticacidven:4)  ) Recent Results (from the past 240 hour(s))  SARS CORONAVIRUS 2 (TAT 6-24 HRS) Nasopharyngeal Nasopharyngeal Swab     Status: None   Collection Time: 01/10/20  9:47 PM   Specimen: Nasopharyngeal Swab  Result Value Ref Range Status   SARS Coronavirus 2 NEGATIVE NEGATIVE Final    Comment: (NOTE) SARS-CoV-2 target nucleic acids are NOT DETECTED. The SARS-CoV-2 RNA is generally detectable in upper and lower respiratory specimens during the acute phase of infection. Negative results do not preclude SARS-CoV-2 infection, do not rule out co-infections with other pathogens, and should not be used as the sole basis for treatment or other patient  management decisions. Negative results must be combined with clinical observations, patient history, and epidemiological information. The expected result is Negative. Fact Sheet for Patients: SugarRoll.be Fact Sheet for Healthcare Providers: https://www.woods-mathews.com/ This test is not yet approved or cleared by the [place] FDA and  has been authorized for detection and/or diagnosis of SARS-CoV-2 by FDA under an Emergency Use Authorization (EUA). This EUA will remain  in effect (meaning this test can be used) for the duration of the COVID-19 declaration under Section 56 4(b)(1) of the Act, 21 U.S.C. section 360bbb-3(b)(1), unless the authorization is terminated  or revoked sooner. Performed at [facility], [place] [address], [place], [place] [address]          Radiology Studies: No results found.      Scheduled Meds: . sodium chloride   Intravenous Once  . acetaminophen  1,000 mg Oral TID  . bisacodyl  10 mg Rectal Once  . feeding supplement (ENSURE ENLIVE)  237 mL Oral BID BM  . furosemide  60 mg Intravenous BID  . heparin injection (subcutaneous)  5,000 Units Subcutaneous Q8H  . polyethylene glycol  17 g Oral Daily  . senna  1 tablet Oral QHS  . sodium chloride flush  3 mL Intravenous Once   Continuous Infusions:   LOS: 4 days    Time spent: 25 minutes  Domenic Polite, MD [facility] 01/14/2020, 10:34 AM

## 2020-01-15 LAB — CBC
HCT: 27.3 % — ABNORMAL LOW (ref 39.0–52.0)
Hemoglobin: 8.7 g/dL — ABNORMAL LOW (ref 13.0–17.0)
MCH: 29.5 pg (ref 26.0–34.0)
MCHC: 31.9 g/dL (ref 30.0–36.0)
MCV: 92.5 fL (ref 80.0–100.0)
Platelets: 44 10*3/uL — ABNORMAL LOW (ref 150–400)
RBC: 2.95 MIL/uL — ABNORMAL LOW (ref 4.22–5.81)
RDW: 23.9 % — ABNORMAL HIGH (ref 11.5–15.5)
WBC: 10.8 10*3/uL — ABNORMAL HIGH (ref 4.0–10.5)
nRBC: 11 % — ABNORMAL HIGH (ref 0.0–0.2)

## 2020-01-15 LAB — BASIC METABOLIC PANEL
Anion gap: 16 — ABNORMAL HIGH (ref 5–15)
BUN: 38 mg/dL — ABNORMAL HIGH (ref 8–23)
CO2: 22 mmol/L (ref 22–32)
Calcium: 8.8 mg/dL — ABNORMAL LOW (ref 8.9–10.3)
Chloride: 100 mmol/L (ref 98–111)
Creatinine, Ser: 1.32 mg/dL — ABNORMAL HIGH (ref 0.61–1.24)
GFR calc Af Amer: 59 mL/min — ABNORMAL LOW (ref >60)
GFR calc non Af Amer: 51 mL/min — ABNORMAL LOW (ref >60)
Glucose, Bld: 118 mg/dL — ABNORMAL HIGH (ref 70–99)
Potassium: 5.2 mmol/L — ABNORMAL HIGH (ref 3.5–5.1)
Sodium: 138 mmol/L (ref 135–145)

## 2020-01-15 MED ORDER — FUROSEMIDE 40 MG PO TABS
40.0000 mg | ORAL_TABLET | Freq: Every day | ORAL | Status: DC
  Administered 2020-01-15 – 2020-01-17 (×3): 40 mg via ORAL
  Filled 2020-01-15 (×3): qty 1

## 2020-01-15 MED ORDER — SODIUM ZIRCONIUM CYCLOSILICATE 10 G PO PACK
10.0000 g | PACK | Freq: Two times a day (BID) | ORAL | Status: DC
  Administered 2020-01-15 (×2): 10 g via ORAL
  Filled 2020-01-15 (×2): qty 1

## 2020-01-15 NOTE — Progress Notes (Signed)
Patient reports no success with miralax provided, reports bloating and discomfort s/p dose- similar to prior dose. Pt was provided a warm blanket for abdominal cramping and 1/3 cup of decaf coffee to see if warm PO fluids could help move things along.   Pt reports difficulty eating but only partially related to his level of comfort.

## 2020-01-15 NOTE — Progress Notes (Signed)
PROGRESS NOTE    Austin Oconnor  [id number] DOB: [birth year] DOA: 01/10/2020 PCP: [name], MD   Brief Narrative:  Austin Oconnor is a 79 y.o. 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 In the ER, afebrile, hemodynamically stable.  Had marked swelling up to the abdomen, hemoglobin 6.2, transaminitis, normal renal function effusion.  Transfused, started on Lasix and admitted to the hospitalist service. -Improving with diuresis and transfusion -Poor prognosis, palliative consulted, patient in denial and refuses to discuss his cancer at all  Assessment & Plan:  Acute on chronic diastolic CHF Right-sided pleural effusion Anasarca EF last December 60 to [id number], grade 1 diastolic dysfunction, normal valves. -Admitted with extensive fluid overload, primarily from diastolic dysfunction and third spacing with severe hypoalbuminemia, extensive malignancy and high output failure from severe anemia -Clinically improving with diuresis -He is 4.9 L negative, creatinine has bumped up a little bit along with potassium -Stop IV Lasix, changed to low-dose p.o. Lasix -Bmet in a.m. Discharge planning   Acute on chronic anemia -Has baseline normocytic anemia likely from chronic disease secondary to mid malignancy -Worsened by hemodilution -Transfused 2 units of PRBCs  -Hemoglobin improved to 9.0, monitor with diuresis -Anemia panel suggestive of chronic disease  Metastatic prostate cancer, castration resistant -Used to be followed by Dr. 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  No obvious bleeding.  Transaminitis From mets, and also maybe congestion.  Hypertension BP normal -Continue lisionpril -Hold baby aspirin  DVT prophylaxis: SCDs due to severe chronic thrombocytopenia Code Status: Full code Family Communication: No family at bedside, called and updated daughter Austin Oconnor 3/5 disposition: Home in 1 to 2 days if stable pending improvement in anasarca, stability of creatinine and potassium  Consultants:     Procedures:   3/2 blood transfusion --2 units PRBC  Antimicrobials:      Culture data:        Subjective: -Feeling better overall, swelling is improving, reports chronic pain Objective: Vitals:   01/14/20 2202 01/15/20 0601 01/15/20 0612 01/15/20 0952  BP: 131/68 126/78  130/81  Pulse: (!) 110 (!) 102    Resp: 18 18    Temp: 99.4 F (37.4 C) 98.4 F (36.9 C)    TempSrc: Oral Oral    SpO2:  99%    Weight:   60.8 kg   Height:        Intake/Output Summary (Last 24 hours) at 01/15/2020 1126 Last data filed at 01/15/2020 1114 Gross per 24 hour  Intake 900 ml  Output 1677 ml  Net -777 ml   Filed Weights   01/13/20 0628 01/14/20 0645 01/15/20 0612  Weight: 71.7 kg 71.3 kg 60.8 kg    Examination: Gen: Chronically  ill-appearing cachectic male laying in bed, AAOx3, no distress HEENT: No JVD Lungs: Decreased breath sounds the bases CVS: RRR,No Gallops,Rubs or new  Murmurs Abd: soft, Non tender, non distended, BS present Extremities: 1-2+ edema improved Skin: no new rashes on exposed skin  Data Reviewed: I have personally reviewed following labs and imaging studies:  CBC: Recent Labs  Lab 01/12/20 1052 01/12/20 1445 01/13/20 0304 01/14/20 0431 01/15/20 0448  WBC 11.2* 11.8* 12.2* 11.5* 10.8*  HGB 9.2* 9.1* 9.2* 9.5* 8.7*  HCT 28.3* 28.4* 27.9* 29.6* 27.3*  MCV 89.8 91.9 90.0 91.4 92.5  PLT 51* 50* 49* 48* 44*   Basic Metabolic Panel: Recent Labs  Lab 01/12/20 0622 01/13/20 0304 01/14/20 0431 01/14/20 1121 01/15/20 0448  NA 136 137 137 136 138  K 4.1 4.6 5.5* 4.7 5.2*  CL 102 101 101 99 100  CO2 23 23 23 23 22   GLUCOSE 85 103* 96 155* 118*  BUN 22 23 28* 30* 38*  CREATININE 0.97 0.95 1.14 1.23 1.32*  CALCIUM 8.4* 8.8* 9.0 9.0 8.8*   GFR: Estimated Creatinine Clearance: 39.7 mL/min (A) (by C-G formula based on SCr of 1.32 mg/dL (H)). Liver Function Tests: Recent Labs  Lab 01/10/20 1824 01/11/20 1225 01/12/20 0622  AST 231* 207* 210*  ALT 65* 60* 61*  ALKPHOS 961* 968* 932*  BILITOT 3.1* 3.8* 3.2*  PROT 5.4* 5.5* 5.5*  ALBUMIN 2.3* 2.1* 2.1*   Recent Labs  Lab 01/10/20 1824  LIPASE 30   No results for input(s): AMMONIA in the last 168 hours. Coagulation Profile: No results for input(s): INR, PROTIME in the last 168 hours. Cardiac Enzymes: No results for input(s): CKTOTAL, CKMB, CKMBINDEX, TROPONINI in the last 168 hours. BNP (last 3 results) No results for input(s): PROBNP in the last 8760 hours. HbA1C: No results for input(s): HGBA1C in the last 72 hours. CBG: Recent Labs  Lab 01/10/20 1825  GLUCAP 110*   Lipid Profile: No results for input(s): CHOL, HDL, LDLCALC, TRIG, CHOLHDL, LDLDIRECT in the last 72 hours. Thyroid Function Tests: No results for input(s): TSH, T4TOTAL, FREET4, T3FREE, THYROIDAB in the last 72 hours. Anemia Panel: No results for input(s): VITAMINB12, FOLATE, FERRITIN, TIBC, IRON,  RETICCTPCT in the last 72 hours. Urine analysis:    Component Value Date/Time   COLORURINE AMBER (A) 01/10/2020 2057   APPEARANCEUR CLEAR 01/10/2020 2057   LABSPEC 1.014 01/10/2020 2057   PHURINE 5.0 01/10/2020 2057   GLUCOSEU NEGATIVE 01/10/2020 2057   HGBUR NEGATIVE 01/10/2020 2057   BILIRUBINUR NEGATIVE 01/10/2020 2057   KETONESUR NEGATIVE 01/10/2020 2057   PROTEINUR NEGATIVE 01/10/2020 2057   NITRITE NEGATIVE 01/10/2020 2057   LEUKOCYTESUR MODERATE (A) 01/10/2020 2057   Sepsis Labs: @LABRCNTIP (procalcitonin:4,lacticacidven:4)  ) Recent Results (from the past 240 hour(s))  SARS CORONAVIRUS 2 (TAT 6-24 HRS) Nasopharyngeal Nasopharyngeal Swab     Status: None   Collection Time: 01/10/20  9:47 PM   Specimen: Nasopharyngeal Swab  Result Value Ref Range Status   SARS Coronavirus 2 NEGATIVE NEGATIVE Final    Comment: (NOTE) SARS-CoV-2 target nucleic acids are NOT DETECTED. The SARS-CoV-2 RNA is generally detectable in upper and lower respiratory specimens during the acute phase of infection. Negative results do not preclude SARS-CoV-2 infection, do not rule out co-infections with other pathogens, and should not be used as the sole basis for treatment or other patient management decisions. Negative results must be combined with clinical observations, patient history, and epidemiological information. The expected result is Negative. Fact Sheet for Patients:  SugarRoll.be Fact Sheet for Healthcare Providers: https://www.woods-mathews.com/ This test is not yet approved or cleared by the [place] FDA and  has been authorized for detection and/or diagnosis of SARS-CoV-2 by FDA under an Emergency Use Authorization (EUA). This EUA will remain  in effect (meaning this test can be used) for the duration of the COVID-19 declaration under Section 56 4(b)(1) of the Act, 21 U.S.C. section 360bbb-3(b)(1), unless the authorization is terminated  or revoked sooner. Performed at [facility], [place] [address], [place], [place] [address]          Radiology Studies: No results found.      Scheduled Meds: . sodium chloride   Intravenous Once  . acetaminophen  1,000 mg Oral TID  . feeding supplement (ENSURE ENLIVE)  237 mL Oral BID BM  . furosemide  40 mg Oral Daily  . polyethylene glycol  17 g Oral Daily  . senna  1 tablet Oral QHS  . sodium chloride flush  3 mL Intravenous Once  . sodium zirconium cyclosilicate  10 g Oral BID   Continuous Infusions:   LOS: 5 days    Time spent: 25 minutes  Domenic Polite, MD [facility] 01/15/2020, 11:26 AM

## 2020-01-15 NOTE — Progress Notes (Signed)
PHYSICAL THERAPY PROGRESS REPORT  CLINICAL IMPRESSION: Pt found in bed states feeling much better today than previous day when he was not able to participate as he was not feeling that well. Bed mob needed assist with moving LLE, therapist attempted to educate on use of RLE to active assist LLE but pt states he has tried everything and just needed help moving LLE. Transfers with SBA/min guard assist, pt able to ambulate approx 161ft w/ RW and min guard assist in hall. At rest pt c/o pain 5/10 and with ambulation reports pain increased to 8/10.  D/C RECOMMENDATION Home w/ Home Health and oob assist      01/15/20 0902  PT Visit Information  Last PT Received On 01/15/20  Assistance Needed +1  History of Present Illness 79 y.o. 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 Cancer treatment for supportive care only at this time.  Subjective Data  Patient Stated Goal To improve mobility  Precautions  Precautions Fall  Restrictions  Weight Bearing Restrictions No  Pain Assessment  Pain Assessment 0-10  Pain Score 5 (5 at rest 8/10 with ambulation)  Pain Location L hip   Pain Descriptors / Indicators Guarding;Grimacing;Moaning  Pain Intervention(s) Monitored during session;Limited activity within patient's tolerance  Cognition  Arousal/Alertness Awake/alert  Behavior During Therapy [facility] for tasks assessed/performed  Overall Cognitive Status Within Functional Limits for tasks assessed  Bed Mobility  Overal bed mobility Needs Assistance  Bed Mobility Supine to Sit;Sit to Supine  Supine to sit Min assist  Sit to supine Min assist  Transfers  Overall transfer level Needs assistance  Equipment used Rolling walker (2 wheeled)  Transfers Sit to/from Stand  Sit to Stand Supervision  Ambulation/Gait   Ambulation/Gait assistance Min guard  Gait Distance (Feet) 120 Feet  Assistive device Rolling walker (2 wheeled)  Gait Pattern/deviations Wide base of support;Decreased step length - right;Decreased stance time - left;Antalgic;Trunk flexed;Step-to pattern  Gait velocity reduced  Balance  Overall balance assessment Needs assistance  Sitting-balance support Feet supported  Sitting balance-Leahy Scale Good  Sitting balance - Comments supervision  Standing balance support During functional activity;Bilateral upper extremity supported  Standing balance-Leahy Scale Fair  Standing balance comment supervision  PT - End of Session  Equipment Utilized During Treatment Gait belt  Activity Tolerance Patient tolerated treatment well  Patient left in bed;with call bell/phone within reach  Nurse Communication Mobility status   PT - Assessment/Plan  PT Plan Current plan remains appropriate  PT Visit Diagnosis Other abnormalities of gait and mobility (R26.89)  PT Frequency (ACUTE ONLY) Min 3X/week  Follow Up Recommendations Home health PT;Supervision - Intermittent  PT equipment None recommended by PT  AM-PAC PT "6 Clicks" Mobility Outcome Measure (Version 2)  Help needed turning from your back to your side while in a flat bed without using bedrails? 3  Help needed moving from lying on your back to sitting on the side of a flat bed without using bedrails? 3  Help needed moving to and from a bed to a chair (including a wheelchair)? 4  Help needed standing up from a chair using your arms (e.g., wheelchair or bedside chair)? 4  Help needed to walk in hospital room? 4  Help needed climbing 3-5 steps with a railing?  2  6 Click Score 20  Consider  Recommendation of Discharge To: Home with no services  PT Goal Progression  Progress towards PT goals Progressing toward goals  Acute Rehab PT Goals  PT Goal Formulation With patient  Time For Goal Achievement 01/26/20  Potential to Achieve Goals Good  PT  Time Calculation  PT Start Time (ACUTE ONLY) 0846  PT Stop Time (ACUTE ONLY) 0902  PT Time Calculation (min) (ACUTE ONLY) 16 min  PT General Charges  $$ ACUTE PT VISIT 1 Visit  PT Treatments  $Gait Training 8-22 mins    Horald Chestnut, PT

## 2020-01-15 NOTE — Progress Notes (Signed)
Patient awake alert and oriented during shift report, eating breakfast. Pt denies complaints at this time.

## 2020-01-16 LAB — CBC
HCT: 27.9 % — ABNORMAL LOW (ref 39.0–52.0)
Hemoglobin: 8.8 g/dL — ABNORMAL LOW (ref 13.0–17.0)
MCH: 28.9 pg (ref 26.0–34.0)
MCHC: 31.5 g/dL (ref 30.0–36.0)
MCV: 91.8 fL (ref 80.0–100.0)
Platelets: 48 10*3/uL — ABNORMAL LOW (ref 150–400)
RBC: 3.04 MIL/uL — ABNORMAL LOW (ref 4.22–5.81)
RDW: 23.8 % — ABNORMAL HIGH (ref 11.5–15.5)
WBC: 11.2 10*3/uL — ABNORMAL HIGH (ref 4.0–10.5)
nRBC: 9.4 % — ABNORMAL HIGH (ref 0.0–0.2)

## 2020-01-16 LAB — BASIC METABOLIC PANEL
Anion gap: 15 (ref 5–15)
BUN: 39 mg/dL — ABNORMAL HIGH (ref 8–23)
CO2: 24 mmol/L (ref 22–32)
Calcium: 8.8 mg/dL — ABNORMAL LOW (ref 8.9–10.3)
Chloride: 96 mmol/L — ABNORMAL LOW (ref 98–111)
Creatinine, Ser: 1.28 mg/dL — ABNORMAL HIGH (ref 0.61–1.24)
GFR calc Af Amer: >60 mL/min (ref >60)
GFR calc non Af Amer: 53 mL/min — ABNORMAL LOW (ref >60)
Glucose, Bld: 104 mg/dL — ABNORMAL HIGH (ref 70–99)
Potassium: 4.8 mmol/L (ref 3.5–5.1)
Sodium: 135 mmol/L (ref 135–145)

## 2020-01-16 MED ORDER — LACTULOSE 10 GM/15ML PO SOLN
20.0000 g | Freq: Three times a day (TID) | ORAL | Status: AC
  Administered 2020-01-16 (×3): 20 g via ORAL
  Filled 2020-01-16 (×3): qty 30

## 2020-01-16 MED ORDER — SIMETHICONE 80 MG PO CHEW
80.0000 mg | CHEWABLE_TABLET | Freq: Once | ORAL | Status: AC
  Administered 2020-01-16: 80 mg via ORAL
  Filled 2020-01-16: qty 1

## 2020-01-16 MED ORDER — BISACODYL 10 MG RE SUPP
10.0000 mg | Freq: Once | RECTAL | Status: AC
  Administered 2020-01-16: 10 mg via RECTAL
  Filled 2020-01-16: qty 1

## 2020-01-16 NOTE — Plan of Care (Signed)
  Problem: Pain Managment: Goal: General experience of comfort will improve Outcome: Progressing   

## 2020-01-16 NOTE — Progress Notes (Signed)
PROGRESS NOTE    Austin Oconnor  [id number] DOB: 08/29/1941 DOA: 01/10/2020 PCP: [name], MD   Brief Narrative:  Mr. Austin Oconnor is a 79 y.o. 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 In the ER, afebrile, hemodynamically stable.  Had marked swelling up to the abdomen, hemoglobin 6.2, transaminitis, normal renal function effusion.  Transfused, started on Lasix and admitted to the hospitalist service. -Improving with diuresis and transfusion -Poor prognosis, palliative consulted, patient in denial and refuses to discuss his cancer at all  Assessment & Plan:  Acute on chronic diastolic CHF Right-sided pleural effusion Anasarca EF last December 60 to [id number], grade 1 diastolic dysfunction, normal valves. -Admitted with extensive fluid overload, primarily from diastolic dysfunction and third spacing with severe hypoalbuminemia, extensive malignancy and high output failure from severe anemia -Clinically improving with diuresis -He is 4.9 L negative, creatinine has bumped up a little bit along with potassium -Held IV Lasix, continue oral Lasix -Potassium improving, stop Lokelma  -bmet in a.m.   Acute on chronic anemia -Has baseline normocytic anemia likely from chronic disease secondary to mid malignancy -Worsened by hemodilution -Transfused 2 units of PRBCs  -Hemoglobin improved to 9.0, monitor with diuresis -Anemia panel suggestive of chronic disease -Hemoglobin stable  Metastatic prostate cancer, castration resistant -Used to be followed by Dr. 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  No obvious bleeding.  Transaminitis From mets, and also maybe congestion.  Hypertension BP normal -Continue lisionpril -Hold baby aspirin  Tachycardia -I suspect this is secondary to pain, continue higher dose of oxycodone for bony mets -Denies any chest pain or dyspnea  DVT prophylaxis: SCDs due to severe chronic thrombocytopenia Code Status: Full code Family Communication: No family at bedside, called and updated daughter Shelton Silvas 3/5 disposition: Home tomorrow if heart rate, kidney function and volume status stable  Consultants:     Procedures:   3/2 blood transfusion --2 units PRBC  Antimicrobials:      Culture data:        Subjective: -Complains of low back pain, left thigh pain -Denies any chest pain or shortness of breath -Complains of discomfort due to constipation Objective: Vitals:   01/15/20 0952 01/15/20 1140 01/15/20 2021 01/16/20 0325  BP: 130/81 119/67 112/74 125/68  Pulse:  99 99 (!) 102  Resp:   19 18  Temp:  98.6 F (37 C) 97.7 F (36.5 C) 98.2 F (36.8 C)  TempSrc:  Oral Oral Oral  SpO2:   98% 96%  Weight:    71.7 kg  Height:        Intake/Output Summary (Last 24 hours) at 01/16/2020 1108 Last data filed at  01/16/2020 0330 Gross per 24 hour  Intake 960 ml  Output 750 ml  Net 210 ml    Filed Weights   01/14/20 0645 01/15/20 0612 01/16/20 0325  Weight: 71.3 kg 60.8 kg 71.7 kg    Examination: Gen: Chronically ill-appearing male laying in bed, AAOx3, no distress HEENT: PERRLA, Neck supple, no JVD Lungs: Decreased breath sounds the bases CVS: RRR,No Gallops,Rubs or new Murmurs Abd: soft, Non tender, non distended, BS present Extremities: 1-2+ edema, improving  skin: no new rashes on exposed  Data Reviewed: I have personally reviewed following labs and imaging studies:  CBC: Recent Labs  Lab 01/12/20 1445 01/13/20 0304 01/14/20 0431 01/15/20 0448 01/16/20 0420  WBC 11.8* 12.2* 11.5* 10.8* 11.2*  HGB 9.1* 9.2* 9.5* 8.7* 8.8*  HCT 28.4* 27.9* 29.6* 27.3* 27.9*  MCV 91.9 90.0 91.4 92.5 91.8  PLT 50* 49* 48* 44* 48*   Basic Metabolic Panel: Recent Labs  Lab 01/13/20 0304 01/14/20 0431 01/14/20 1121 01/15/20 0448 01/16/20 0420  NA 137 137 136 138 135  K 4.6 5.5* 4.7 5.2* 4.8  CL 101 101 99 100 96*  CO2 23 23 23 22 24   GLUCOSE 103* 96 155* 118* 104*  BUN 23 28* 30* 38* 39*  CREATININE 0.95 1.14 1.23 1.32* 1.28*  CALCIUM 8.8* 9.0 9.0 8.8* 8.8*   GFR: Estimated Creatinine Clearance: 46 mL/min (A) (by C-G formula based on SCr of 1.28 mg/dL (H)). Liver Function Tests: Recent Labs  Lab 01/10/20 1824 01/11/20 1225 01/12/20 0622  AST 231* 207* 210*  ALT 65* 60* 61*  ALKPHOS 961* 968* 932*  BILITOT 3.1* 3.8* 3.2*  PROT 5.4* 5.5* 5.5*  ALBUMIN 2.3* 2.1* 2.1*   Recent Labs  Lab 01/10/20 1824  LIPASE 30   No results for input(s): AMMONIA in the last 168 hours. Coagulation Profile: No results for input(s): INR, PROTIME in the last 168 hours. Cardiac Enzymes: No results for input(s): CKTOTAL, CKMB, CKMBINDEX, TROPONINI in the last 168 hours. BNP (last 3 results) No results for input(s): PROBNP in the last 8760 hours. HbA1C: No results for input(s): HGBA1C in the last 72 hours. CBG: Recent Labs  Lab 01/10/20 1825  GLUCAP 110*   Lipid  Profile: No results for input(s): CHOL, HDL, LDLCALC, TRIG, CHOLHDL, LDLDIRECT in the last 72 hours. Thyroid Function Tests: No results for input(s): TSH, T4TOTAL, FREET4, T3FREE, THYROIDAB in the last 72 hours. Anemia Panel: No results for input(s): VITAMINB12, FOLATE, FERRITIN, TIBC, IRON, RETICCTPCT in the last 72 hours. Urine analysis:    Component Value Date/Time   COLORURINE AMBER (A) 01/10/2020 2057   APPEARANCEUR CLEAR 01/10/2020 2057   LABSPEC 1.014 01/10/2020 2057   PHURINE 5.0 01/10/2020 2057   GLUCOSEU NEGATIVE 01/10/2020 2057   HGBUR NEGATIVE 01/10/2020 2057   BILIRUBINUR NEGATIVE 01/10/2020 2057   KETONESUR NEGATIVE 01/10/2020 2057   PROTEINUR NEGATIVE 01/10/2020 2057   NITRITE NEGATIVE 01/10/2020 2057   LEUKOCYTESUR MODERATE (A) 01/10/2020 2057   Sepsis Labs: @LABRCNTIP (procalcitonin:4,lacticacidven:4)  ) Recent Results (from the past 240 hour(s))  SARS CORONAVIRUS 2 (TAT 6-24 HRS) Nasopharyngeal Nasopharyngeal Swab     Status: None   Collection Time: 01/10/20  9:47 PM   Specimen: Nasopharyngeal Swab  Result Value Ref Range Status   SARS Coronavirus 2 NEGATIVE NEGATIVE Final    Comment: (NOTE) SARS-CoV-2 target nucleic acids are NOT DETECTED. The SARS-CoV-2 RNA is generally detectable in upper and lower respiratory specimens during the acute phase of infection. Negative results do not preclude SARS-CoV-2 infection,  do not rule out co-infections with other pathogens, and should not be used as the sole basis for treatment or other patient management decisions. Negative results must be combined with clinical observations, patient history, and epidemiological information. The expected result is Negative. Fact Sheet for Patients: SugarRoll.be Fact Sheet for Healthcare Providers: https://www.woods-mathews.com/ This test is not yet approved or cleared by the [place] FDA and  has been authorized for detection and/or  diagnosis of SARS-CoV-2 by FDA under an Emergency Use Authorization (EUA). This EUA will remain  in effect (meaning this test can be used) for the duration of the COVID-19 declaration under Section 56 4(b)(1) of the Act, 21 U.S.C. section 360bbb-3(b)(1), unless the authorization is terminated or revoked sooner. Performed at [facility], [place] [address], [place], [place] [address]          Radiology Studies: No results found.      Scheduled Meds: . sodium chloride   Intravenous Once  . acetaminophen  1,000 mg Oral TID  . feeding supplement (ENSURE ENLIVE)  237 mL Oral BID BM  . furosemide  40 mg Oral Daily  . lactulose  20 g Oral TID  . polyethylene glycol  17 g Oral Daily  . senna  1 tablet Oral QHS  . sodium chloride flush  3 mL Intravenous Once   Continuous Infusions:   LOS: 6 days    Time spent: 25 minutes  Domenic Polite, MD [facility] 01/16/2020, 11:08 AM

## 2020-01-17 ENCOUNTER — Ambulatory Visit: Payer: Medicare Other

## 2020-01-17 ENCOUNTER — Inpatient Hospital Stay (HOSPITAL_COMMUNITY): Payer: Medicare Other

## 2020-01-17 DIAGNOSIS — R609 Edema, unspecified: Secondary | ICD-10-CM

## 2020-01-17 LAB — BASIC METABOLIC PANEL
Anion gap: 17 — ABNORMAL HIGH (ref 5–15)
BUN: 44 mg/dL — ABNORMAL HIGH (ref 8–23)
CO2: 23 mmol/L (ref 22–32)
Calcium: 8.7 mg/dL — ABNORMAL LOW (ref 8.9–10.3)
Chloride: 97 mmol/L — ABNORMAL LOW (ref 98–111)
Creatinine, Ser: 1.42 mg/dL — ABNORMAL HIGH (ref 0.61–1.24)
GFR calc Af Amer: 54 mL/min — ABNORMAL LOW (ref >60)
GFR calc non Af Amer: 47 mL/min — ABNORMAL LOW (ref >60)
Glucose, Bld: 111 mg/dL — ABNORMAL HIGH (ref 70–99)
Potassium: 4 mmol/L (ref 3.5–5.1)
Sodium: 137 mmol/L (ref 135–145)

## 2020-01-17 LAB — CBC
HCT: 26.6 % — ABNORMAL LOW (ref 39.0–52.0)
Hemoglobin: 8.4 g/dL — ABNORMAL LOW (ref 13.0–17.0)
MCH: 29.4 pg (ref 26.0–34.0)
MCHC: 31.6 g/dL (ref 30.0–36.0)
MCV: 93 fL (ref 80.0–100.0)
Platelets: 53 10*3/uL — ABNORMAL LOW (ref 150–400)
RBC: 2.86 MIL/uL — ABNORMAL LOW (ref 4.22–5.81)
RDW: 23.8 % — ABNORMAL HIGH (ref 11.5–15.5)
WBC: 11.6 10*3/uL — ABNORMAL HIGH (ref 4.0–10.5)
nRBC: 10.4 % — ABNORMAL HIGH (ref 0.0–0.2)

## 2020-01-17 MED ORDER — IOHEXOL 300 MG/ML  SOLN: 25.0000 mL | INTRAMUSCULAR | Status: AC

## 2020-01-17 MED ORDER — MORPHINE SULFATE (PF) 2 MG/ML IV SOLN
1.0000 mg | Freq: Once | INTRAVENOUS | Status: AC
  Administered 2020-01-17: 1 mg via INTRAVENOUS
  Filled 2020-01-17: qty 1

## 2020-01-17 MED ORDER — HYDROMORPHONE HCL 1 MG/ML IJ SOLN
1.0000 mg | INTRAMUSCULAR | Status: DC | PRN
  Administered 2020-01-17 – 2020-01-18 (×2): 1 mg via INTRAVENOUS
  Filled 2020-01-17 (×3): qty 1

## 2020-01-17 NOTE — TOC Progression Note (Addendum)
Transition of Care [facility]) - Progression Note    Patient Details  Name: Austin Oconnor MRN: [id number] Date of Birth: [birth year]  Transition of Care [facility]) CM/SW Contact  Zenon Mayo, RN Phone Number: 01/17/2020, 1:25 PM  Clinical Narrative:    NCM received message from daughter Shelton Silvas to call Sula Soda with [facility] at [phone].  NCM contacted Sula Soda she states daughter has chosen them for home hospice , and that he should have all the DME at home.  She may recommend a hospital bed for him.  Will need to let her know when he is being discharged.    Expected Discharge Plan: [place] Barriers to Discharge: Continued Medical Work up  Expected Discharge Plan and Services Expected Discharge Plan: [place] Choice: [place] arrangements for the past 2 months: Single Family Home                                       Social Determinants of Health (SDOH) Interventions    Readmission Risk Interventions No flowsheet data found.

## 2020-01-17 NOTE — Care Management Important Message (Signed)
Important Message  Patient Details  Name: Austin Oconnor MRN: [id number] Date of Birth: 01-Apr-1941   Medicare Important Message Given:  Yes     Shelda Altes 01/17/2020, 10:46 AM

## 2020-01-17 NOTE — Plan of Care (Signed)
  Problem: Elimination: Goal: Will not experience complications related to bowel motility Outcome: Progressing   

## 2020-01-17 NOTE — Progress Notes (Signed)
Lower extremity venous has been completed.   Preliminary results in CV Proc.   Abram Sander 01/17/2020 11:24 AM

## 2020-01-17 NOTE — TOC Progression Note (Signed)
Transition of Care [facility]) - Progression Note    Patient Details  Name: Austin Oconnor MRN: [id number] Date of Birth: 01-02-1941  Transition of Care [facility]) CM/SW Contact  Zenon Mayo, RN Phone Number: 01/17/2020, 10:29 AM  Clinical Narrative:    NCM left message for Shelton Silvas to call this NCM back with name of home hospice she chose for patient. Awaiting call back.   Expected Discharge Plan: [place] Barriers to Discharge: Continued Medical Work up  Expected Discharge Plan and Services Expected Discharge Plan: [place] Choice: [place] arrangements for the past 2 months: Single Family Home                                       Social Determinants of Health (SDOH) Interventions    Readmission Risk Interventions No flowsheet data found.

## 2020-01-17 NOTE — Progress Notes (Signed)
Physical Therapy Treatment Patient Details Name: Austin Oconnor MRN: [id number] DOB: 09/06/1941 Today's Date: 01/17/2020    History of Present Illness 79 y.o. 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 Cancer treatment for supportive care only at this time.    PT Comments    Pt with significant 10/10 L hip/flank pain today limiting mobility. Pt was amb in hallway with minA and RW however today pt requiring maxA for bed mobility, modA for sit to stand and was only able to side step up to [facility]. Pt continues with bilat pitting edema L worse than R. Pt also appears to maybe have some jaundice in eyes. Alerted MD to assess. Pt with decline in mobility this date due to severe L flank pain. Acute PT to continue to follow.   Follow Up Recommendations  Home health PT;Supervision - Intermittent(aware they are looking at home with hospice care)     Equipment Recommendations  Rolling walker with 5" wheels    Recommendations for Other Services       Precautions / Restrictions Precautions Precautions: Fall Restrictions Weight Bearing Restrictions: No    Mobility  Bed Mobility Overal bed mobility: Needs Assistance Bed Mobility: Supine to Sit;Sit to Supine     Supine to sit: Max assist Sit to supine: Max assist   General bed mobility comments: pt with significant L hip/flank pain limiting active mobility, maxA for LE management to EOB and trunk elevation, maxA for LE management back into the bed  Transfers Overall transfer level: Needs assistance Equipment used: Rolling walker (2 wheeled) Transfers: Sit to/from Stand Sit to Stand: Supervision;Mod assist;From elevated surface         General transfer comment: increased time, dec L LE wbing due ot pain, bed very elevated  Ambulation/Gait      Assistive device: Rolling walker (2 wheeled)       General Gait Details: completed 4 side steps to EOB, trunk flexed, limited L LE WBing   Stairs             Wheelchair Mobility    Modified Rankin (Stroke Patients Only)       Balance Overall balance assessment: Needs assistance Sitting-balance support: Feet supported;Single extremity supported Sitting balance-Leahy Scale: Fair Sitting balance - Comments: limited by L flank pain today, heavy reliance on R UE   Standing balance support: During functional activity Standing balance-Leahy Scale: Poor Standing balance comment: dependent on UE support and RW                            Cognition Arousal/Alertness: Awake/alert Behavior During Therapy: WFL for tasks assessed/performed Overall Cognitive Status: Within Functional Limits for tasks assessed                                        Exercises      General Comments General comments (skin integrity, edema, etc.): pt with significant L flank pain today, VSS, noticed some jaundice in bilat eyes today      Pertinent Vitals/Pain Pain Assessment: 0-10 Pain Score: 10-Worst pain ever Pain Location: L hip and flank Pain Descriptors / Indicators: Guarding;Grimacing;Moaning;Sharp Pain Intervention(s): Monitored during session    Home  Living                      Prior Function            PT Goals (current goals can now be found in the care plan section) Progress towards PT goals: Not progressing toward goals - comment    Frequency    Min 3X/week      PT Plan Current plan remains appropriate    Co-evaluation              AM-PAC PT "6 Clicks" Mobility   Outcome Measure  Help needed turning from your back to your side while in a flat bed without using bedrails?: A Lot Help needed moving from lying on your back to sitting on the side of a flat bed without using bedrails?: A Lot Help needed moving to and from  a bed to a chair (including a wheelchair)?: A Lot Help needed standing up from a chair using your arms (e.g., wheelchair or bedside chair)?: A Little Help needed to walk in hospital room?: A Lot Help needed climbing 3-5 steps with a railing? : Total 6 Click Score: 12    End of Session Equipment Utilized During Treatment: Gait belt Activity Tolerance: Patient tolerated treatment well Patient left: with call bell/phone within reach;in bed;with bed alarm set Nurse Communication: Mobility status PT Visit Diagnosis: Other abnormalities of gait and mobility (R26.89)     Time: [id number] PT Time Calculation (min) (ACUTE ONLY): 21 min  Charges:  $Therapeutic Activity: 8-22 mins                     Kittie Plater, PT, DPT Acute Rehabilitation Services Pager #: [phone] Office #: [phone]    Berline Lopes 01/17/2020, 1:43 PM

## 2020-01-17 NOTE — Progress Notes (Signed)
PROGRESS NOTE    Austin Oconnor  [id number] DOB: [birth year] DOA: 01/10/2020 PCP: [name], MD   Brief Narrative:  Austin Oconnor is a 79 y.o. 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 In the ER, afebrile, hemodynamically stable.  Had marked swelling up to the abdomen, hemoglobin 6.2, transaminitis, normal renal function effusion.  Transfused, started on Lasix and admitted to the hospitalist service. -Improving with diuresis and transfusion -Poor prognosis, palliative consulted, patient in denial and refuses to discuss his cancer at all -Patient's oncologist told him he is approaching end-stage status and recommended hospice in January Today 3/8 with acute left lower quadrant abdominal pain   Assessment & Plan:  Acute on chronic diastolic CHF Right-sided pleural effusion Anasarca EF last December 60 to [id number], grade 1 diastolic dysfunction, normal valves. -Admitted with extensive fluid overload, primarily from diastolic dysfunction and third spacing with severe hypoalbuminemia, extensive malignancy and high output failure from severe anemia -Diuresed with IV Lasix, he was -4.8 L  -Subsequently had some worsening of his creatinine hence IV diuretics were held, lisinopril stopped -Still continues to have significant lower extremity edema which is in part secondary to third spacing from hypoalbuminemia and progressive cancer   Acute left lower quadrant abdominal pain -Etiology unclear, check CT abdomen, supportive care -Patient is not a candidate for surgery if he has an acute abdomen   Acute on chronic anemia -Has baseline normocytic anemia likely from chronic disease secondary to mid malignancy -Worsened by hemodilution -Transfused 2 units of PRBCs  -Anemia panel  suggestive of chronic disease, hemoglobin is stabilized  Metastatic prostate cancer, castration resistant -Used to be followed by Dr. Alen Blew, progressed on third line salvage therapy -Per Dr. 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  No obvious bleeding.  Abnormal LFTs -Likely secondary to liver mets -No further work-up planned  Hypertension BP normal -Hold baby aspirin  DVT prophylaxis: SCDs due to severe chronic thrombocytopenia Code Status: Full code Family Communication: No family at bedside, called and updated daughter Shelton Silvas 3/5, 3/8 disposition: Home with hospice pending improvement in acute left lower quadrant abdominal pain which started today  Consultants:     Procedures:   3/2 blood transfusion --2 units PRBC  Antimicrobials:      Culture data:        Subjective: -Complains of severe left lower quadrant abdominal pain since this morning, also has nausea -Reports small bowel movement yesterday, no vomiting yet Objective: Vitals:   01/16/20 1931 01/17/20 0333 01/17/20 0808 01/17/20 1145  BP: 129/69 (!) 145/81 134/64 118/61  Pulse: (!) 103 (!) 108 (!) 109 (!) 103  Resp: 19 19 20 17   Temp: 98.6 F (37 C) 98.4 F (36.9 C) 98.8 F (37.1 C) 98.3 F (36.8 C)  TempSrc: Oral Oral Oral Oral  SpO2: 96% 98% 94% 98%  Weight:  71.2 kg    Height:  Intake/Output Summary (Last 24 hours) at 01/17/2020 1401 Last  data filed at 01/17/2020 1100 Gross per 24 hour  Intake 720 ml  Output 751 ml  Net -31 ml   Filed Weights   01/15/20 0612 01/16/20 0325 01/17/20 0333  Weight: 60.8 kg 71.7 kg 71.2 kg    Examination: Gen: Chronically ill-appearing cachectic male, appears much older than stated age, awake alert oriented to self and place, uncomfortable appearing HEENT: Positive JVD Lungs: Decreased breath sounds the bases CVS: S1-S2, regular rate rhythm Abd: Soft, mild left lower quadrant tenderness, no rigidity or rebound, bowel sounds diminished but present  extremities: 2+ edema Skin: Dressing on his left hip  Data Reviewed: I have personally reviewed following labs and imaging studies:  CBC: Recent Labs  Lab 01/13/20 0304 01/14/20 0431 01/15/20 0448 01/16/20 0420 01/17/20 0430  WBC 12.2* 11.5* 10.8* 11.2* 11.6*  HGB 9.2* 9.5* 8.7* 8.8* 8.4*  HCT 27.9* 29.6* 27.3* 27.9* 26.6*  MCV 90.0 91.4 92.5 91.8 93.0  PLT 49* 48* 44* 48* 53*   Basic Metabolic Panel: Recent Labs  Lab 01/14/20 0431 01/14/20 1121 01/15/20 0448 01/16/20 0420 01/17/20 0430  NA 137 136 138 135 137  K 5.5* 4.7 5.2* 4.8 4.0  CL 101 99 100 96* 97*  CO2 23 23 22 24 23   GLUCOSE 96 155* 118* 104* 111*  BUN 28* 30* 38* 39* 44*  CREATININE 1.14 1.23 1.32* 1.28* 1.42*  CALCIUM 9.0 9.0 8.8* 8.8* 8.7*   GFR: Estimated Creatinine Clearance: 41.5 mL/min (A) (by C-G formula based on SCr of 1.42 mg/dL (H)). Liver Function Tests: Recent Labs  Lab 01/10/20 1824 01/11/20 1225 01/12/20 0622  AST 231* 207* 210*  ALT 65* 60* 61*  ALKPHOS 961* 968* 932*  BILITOT 3.1* 3.8* 3.2*  PROT 5.4* 5.5* 5.5*  ALBUMIN 2.3* 2.1* 2.1*   Recent Labs  Lab 01/10/20 1824  LIPASE 30   No results for input(s): AMMONIA in the last 168 hours. Coagulation Profile: No results for input(s): INR, PROTIME in the last 168 hours. Cardiac Enzymes: No results for input(s): CKTOTAL, CKMB, CKMBINDEX, TROPONINI in the last 168 hours. BNP (last 3  results) No results for input(s): PROBNP in the last 8760 hours. HbA1C: No results for input(s): HGBA1C in the last 72 hours. CBG: Recent Labs  Lab 01/10/20 1825  GLUCAP 110*   Lipid Profile: No results for input(s): CHOL, HDL, LDLCALC, TRIG, CHOLHDL, LDLDIRECT in the last 72 hours. Thyroid Function Tests: No results for input(s): TSH, T4TOTAL, FREET4, T3FREE, THYROIDAB in the last 72 hours. Anemia Panel: No results for input(s): VITAMINB12, FOLATE, FERRITIN, TIBC, IRON, RETICCTPCT in the last 72 hours. Urine analysis:    Component Value Date/Time   COLORURINE AMBER (A) 01/10/2020 2057   APPEARANCEUR CLEAR 01/10/2020 2057   LABSPEC 1.014 01/10/2020 2057   PHURINE 5.0 01/10/2020 2057   GLUCOSEU NEGATIVE 01/10/2020 2057   HGBUR NEGATIVE 01/10/2020 2057   BILIRUBINUR NEGATIVE 01/10/2020 2057   KETONESUR NEGATIVE 01/10/2020 2057   PROTEINUR NEGATIVE 01/10/2020 2057   NITRITE NEGATIVE 01/10/2020 2057   LEUKOCYTESUR MODERATE (A) 01/10/2020 2057   Sepsis Labs: @LABRCNTIP (procalcitonin:4,lacticacidven:4)  ) Recent Results (from the past 240 hour(s))  SARS CORONAVIRUS 2 (TAT 6-24 HRS) Nasopharyngeal Nasopharyngeal Swab     Status: None   Collection Time: 01/10/20  9:47 PM   Specimen: Nasopharyngeal Swab  Result Value Ref Range Status   SARS Coronavirus 2 NEGATIVE NEGATIVE Final    Comment: (NOTE) SARS-CoV-2 target nucleic acids are NOT DETECTED.  The SARS-CoV-2 RNA is generally detectable in upper and lower respiratory specimens during the acute phase of infection. Negative results do not preclude SARS-CoV-2 infection, do not rule out co-infections with other pathogens, and should not be used as the sole basis for treatment or other patient management decisions. Negative results must be combined with clinical observations, patient history, and epidemiological information. The expected result is Negative. Fact Sheet for  Patients: SugarRoll.be Fact Sheet for Healthcare Providers: https://www.woods-mathews.com/ This test is not yet approved or cleared by the [place] FDA and  has been authorized for detection and/or diagnosis of SARS-CoV-2 by FDA under an Emergency Use Authorization (EUA). This EUA will remain  in effect (meaning this test can be used) for the duration of the COVID-19 declaration under Section 56 4(b)(1) of the Act, 21 U.S.C. section 360bbb-3(b)(1), unless the authorization is terminated or revoked sooner. Performed at [facility], [place] [address] [place],  [address]          Radiology Studies: DG Abd Portable 1V  Result Date: 01/17/2020 CLINICAL DATA:  Acute onset abdominal pain this morning. History of metastatic prostate cancer. EXAM: PORTABLE ABDOMEN - 1 VIEW COMPARISON:  Whole-body bone scan 08/03/2019 FINDINGS: The bowel gas pattern is normal. No radio-opaque calculi or other significant radiographic abnormality are seen. Bones are diffusely sclerotic. IMPRESSION: No acute abnormality.  No evidence of obstruction. Diffuse bony sclerosis consistent with known metastatic prostate cancer. Electronically Signed   By: Inge Rise M.D.   On: 01/17/2020 12:04   VAS [place] LOWER EXTREMITY VENOUS (DVT)  Result Date: 01/17/2020  Lower Venous DVTStudy Indications: Swelling, and Edema.  Comparison Study: no prior Performing Technologist: Abram Sander RVS  Examination Guidelines: A complete evaluation includes B-mode imaging, spectral Doppler, color Doppler, and power Doppler as needed of all accessible portions of each vessel. Bilateral testing is considered an integral part of a complete examination. Limited examinations for reoccurring indications may be performed as noted. The reflux portion of the exam is performed with the patient in reverse Trendelenburg.  +---------+---------------+---------+-----------+----------+--------------+  RIGHT    CompressibilityPhasicitySpontaneityPropertiesThrombus Aging +---------+---------------+---------+-----------+----------+--------------+ CFV      Full           Yes      Yes                                 +---------+---------------+---------+-----------+----------+--------------+ SFJ      Full                                                        +---------+---------------+---------+-----------+----------+--------------+ FV Prox  Full                                                        +---------+---------------+---------+-----------+----------+--------------+ FV Mid   Full                                                        +---------+---------------+---------+-----------+----------+--------------+  FV DistalFull                                                        +---------+---------------+---------+-----------+----------+--------------+ PFV      Full                                                        +---------+---------------+---------+-----------+----------+--------------+ POP      Full           Yes      Yes                                 +---------+---------------+---------+-----------+----------+--------------+ PTV      Full                                                        +---------+---------------+---------+-----------+----------+--------------+ PERO     Full                                                        +---------+---------------+---------+-----------+----------+--------------+   +---------+---------------+---------+-----------+----------+-------------------+ LEFT     CompressibilityPhasicitySpontaneityPropertiesThrombus Aging      +---------+---------------+---------+-----------+----------+-------------------+ CFV      Full           Yes      Yes                                      +---------+---------------+---------+-----------+----------+-------------------+ SFJ      Full                                                              +---------+---------------+---------+-----------+----------+-------------------+ FV Prox  Full                                                             +---------+---------------+---------+-----------+----------+-------------------+ FV Mid   Full                                                             +---------+---------------+---------+-----------+----------+-------------------+ FV Distal               Yes  Yes                  unable to tolerate                                                        compression         +---------+---------------+---------+-----------+----------+-------------------+ PFV      Full                                                             +---------+---------------+---------+-----------+----------+-------------------+ POP      Full           Yes      Yes                                      +---------+---------------+---------+-----------+----------+-------------------+ PTV      Full                                                             +---------+---------------+---------+-----------+----------+-------------------+ PERO     Full                                                             +---------+---------------+---------+-----------+----------+-------------------+     Summary: BILATERAL: - No evidence of deep vein thrombosis seen in the lower extremities, bilaterally.   *See table(s) above for measurements and observations.    Preliminary         Scheduled Meds: . sodium chloride   Intravenous Once  . acetaminophen  1,000 mg Oral TID  . feeding supplement (ENSURE ENLIVE)  237 mL Oral BID BM  . polyethylene glycol  17 g Oral Daily  . senna  1 tablet Oral QHS  . sodium chloride flush  3 mL Intravenous Once   Continuous Infusions:   LOS: 7 days    Time spent: 25 minutes  Domenic Polite, MD [facility] 01/17/2020, 2:01 PM

## 2020-01-17 NOTE — Progress Notes (Signed)
Patient started complaining of left low abdominal pain. Made MD aware.

## 2020-01-17 NOTE — Progress Notes (Signed)
2 bottles of 500 ml premixed oral contrast was given to patient for prep to CT scan.  MAR only shows previous version of oral contrast so wasn't able to scan the med. Radiology will scan the meds downstairs when pt arrives for CT.

## 2020-01-18 ENCOUNTER — Ambulatory Visit: Payer: Medicare Other

## 2020-01-18 ENCOUNTER — Ambulatory Visit: Payer: Medicare Other | Admitting: Orthopaedic Surgery

## 2020-01-18 LAB — CBC
HCT: 25.9 % — ABNORMAL LOW (ref 39.0–52.0)
Hemoglobin: 8.2 g/dL — ABNORMAL LOW (ref 13.0–17.0)
MCH: 29.1 pg (ref 26.0–34.0)
MCHC: 31.7 g/dL (ref 30.0–36.0)
MCV: 91.8 fL (ref 80.0–100.0)
Platelets: 66 10*3/uL — ABNORMAL LOW (ref 150–400)
RBC: 2.82 MIL/uL — ABNORMAL LOW (ref 4.22–5.81)
RDW: 23.8 % — ABNORMAL HIGH (ref 11.5–15.5)
WBC: 14 10*3/uL — ABNORMAL HIGH (ref 4.0–10.5)
nRBC: 11.2 % — ABNORMAL HIGH (ref 0.0–0.2)

## 2020-01-18 LAB — BASIC METABOLIC PANEL
Anion gap: 18 — ABNORMAL HIGH (ref 5–15)
BUN: 46 mg/dL — ABNORMAL HIGH (ref 8–23)
CO2: 23 mmol/L (ref 22–32)
Calcium: 9 mg/dL (ref 8.9–10.3)
Chloride: 94 mmol/L — ABNORMAL LOW (ref 98–111)
Creatinine, Ser: 1.48 mg/dL — ABNORMAL HIGH (ref 0.61–1.24)
GFR calc Af Amer: 52 mL/min — ABNORMAL LOW (ref >60)
GFR calc non Af Amer: 45 mL/min — ABNORMAL LOW (ref >60)
Glucose, Bld: 160 mg/dL — ABNORMAL HIGH (ref 70–99)
Potassium: 4.5 mmol/L (ref 3.5–5.1)
Sodium: 135 mmol/L (ref 135–145)

## 2020-01-18 LAB — PATHOLOGIST SMEAR REVIEW

## 2020-01-18 MED ORDER — OXYCODONE HCL 20 MG PO TABS: 20.0000 mg | ORAL_TABLET | ORAL | 0 refills | Status: DC | PRN

## 2020-01-18 MED ORDER — POLYETHYLENE GLYCOL 3350 17 G PO PACK: 17.0000 g | PACK | Freq: Every day | ORAL | 0 refills | Status: AC | PRN

## 2020-01-18 MED ORDER — SENNA 8.6 MG PO TABS: 1.0000 | ORAL_TABLET | Freq: Every day | ORAL | 0 refills | Status: AC

## 2020-01-18 MED ORDER — OXYCODONE HCL 20 MG PO TABS: 20.0000 mg | ORAL_TABLET | ORAL | 0 refills | Status: AC | PRN

## 2020-01-18 NOTE — Progress Notes (Signed)
Physical Therapy Treatment Patient Details Name: Austin Oconnor MRN: [id number] DOB: [birth year] Today's Date: 01/18/2020    History of Present Illness 79 y.o. 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 Cancer treatment for supportive care only at this time.    PT Comments    Patient seen for mobility progression. Pt tolerated gait distance of ~30 ft this session before fatigued and required mod A for functional transfer gait training. Mobility appeared very effortful for pt today however pain was less limiting than yesterday's session. PT will continue to follow acutely and progress as tolerated.    Follow Up Recommendations  Home health PT;Supervision - Intermittent(noted plan is home with hospice services)     Equipment Recommendations  Rolling walker with 5" wheels    Recommendations for Other Services       Precautions / Restrictions Precautions Precautions: Fall Restrictions Weight Bearing Restrictions: No    Mobility  Bed Mobility               General bed mobility comments: pt OOB in chair upon arrival  Transfers Overall transfer level: Needs assistance Equipment used: Rolling walker (2 wheeled) Transfers: Sit to/from Stand Sit to Stand: Mod assist         General transfer comment: cues for positioning prior to stand; assistance required to power up into standing   Ambulation/Gait Ambulation/Gait assistance: Mod assist Gait Distance (Feet): 30 Feet Assistive device: Rolling walker (2 wheeled) Gait Pattern/deviations: Step-through pattern;Decreased stance time - left;Decreased step length - right;Decreased stride length;Decreased weight shift to left;Antalgic;Trunk flexed     General Gait Details: assistance required for balance and managing RW; pt with  posterior bias     Stairs             Wheelchair Mobility    Modified Rankin (Stroke Patients Only)       Balance Overall balance assessment: Needs assistance Sitting-balance support: Feet supported;Single extremity supported Sitting balance-Leahy Scale: Fair     Standing balance support: During functional activity Standing balance-Leahy Scale: Poor                              Cognition Arousal/Alertness: Awake/alert Behavior During Therapy: WFL for tasks assessed/performed Overall Cognitive Status: Within Functional Limits for tasks assessed                                        Exercises      General Comments        Pertinent Vitals/Pain Pain Assessment: Faces Faces Pain Scale: Hurts little more Pain Location: L hip and flank Pain Descriptors / Indicators: Guarding;Grimacing;Sore Pain Intervention(s): Limited activity within patient's tolerance;Monitored during session;Repositioned    Home Living                      Prior Function            PT Goals (current goals can now be found in the care plan section) Progress towards PT goals: Progressing toward goals    Frequency    Min 3X/week      PT Plan Current plan remains appropriate    Co-evaluation  AM-PAC PT "6 Clicks" Mobility   Outcome Measure  Help needed turning from your back to your side while in a flat bed without using bedrails?: A Lot Help needed moving from lying on your back to sitting on the side of a flat bed without using bedrails?: A Lot Help needed moving to and from a bed to a chair (including a wheelchair)?: A Lot Help needed standing up from a chair using your arms (e.g., wheelchair or bedside chair)?: A Lot Help needed to walk in hospital room?: A Lot Help needed climbing 3-5 steps with a railing? : Total 6 Click Score: 11    End of Session Equipment Utilized During Treatment: Gait belt Activity Tolerance:  Patient limited by fatigue;Patient limited by pain Patient left: with call bell/phone within reach;in chair Nurse Communication: Mobility status PT Visit Diagnosis: Other abnormalities of gait and mobility (R26.89)     Time: [id number] PT Time Calculation (min) (ACUTE ONLY): 25 min  Charges:  $Gait Training: 23-37 mins                     Austin Oconnor, Austin Oconnor Acute Rehabilitation Services Pager: [phone] Office: [phone]     Austin Oconnor 01/18/2020, 3:05 PM

## 2020-01-18 NOTE — Progress Notes (Signed)
Chaplain engaged in initial visit with Austin Oconnor.  Austin Oconnor shared about his children, grandchildren and upbringing with chaplain.  He expressed that it was nice to have someone to talk too.  He also expressed recognizing what is happening with his body.  He has heard staff continuously bring up his life span and hospice, which has fully resonated with him.  He is fully aware of those things, but for him he wants to continue to live his life as normal as possible and whenever God decides the time, it will happen.    Chaplain will follow-up as needed.

## 2020-01-18 NOTE — Progress Notes (Signed)
  Mobility Specialist Criteria Algorithm Info.  Mobility Team:   01/18/20 0848  Mobility  Activity Ambulated in room;Transferred:  Bed to chair  Range of Motion Active;All extremities  Level of Assistance Maximum assist, patient does 25-49% (Max A supine to sit at EOB, Max A for trunk elevation)  Assistive Device Front wheel walker  Minutes Stood 3 minutes  Minutes Ambulated 1 minutes  Distance Ambulated (ft) 5 ft  Mobility Response Tolerated fair  Bed Position Chair   HR Pre: 103 HR Post: 115  Although the patient was complaining of 8/10 LLE hip/flank pain he was eager and willing to participate in mobility. Needed more significant  assistance and time to get up requiring Max A with LE's from supine to sit. He requested to raise the bed height up to assist with standing in which he needed min A to stand from elevated surface. Patient stood at the EOB for approximately 2 minutes needing min assist to balance and steady before ambulating to chair.    01/18/2020 11:11 AM

## 2020-01-18 NOTE — Discharge Summary (Addendum)
Physician Discharge Summary  Austin Oconnor [id number] DOB: 13-Feb-1941 DOA: 01/10/2020  PCP: [name], MD  Admit date: 01/10/2020 Discharge date: 01/18/2020  Time spent: 45 minutes  Recommendations for Outpatient Follow-up:  1. 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 Austin Oconnor is a 79 y.o. 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   Had marked swelling up to the abdomen, hemoglobin 6.2  Hospital Course:   Acute on chronic diastolic CHF Right-sided pleural effusion Anasarca EF last December 60 to [id number], grade 1 diastolic dysfunction, normal valves. -Admitted with extensive fluid overload, primarily from diastolic dysfunction and third spacing with severe hypoalbuminemia, extensive malignancy, worsened -Diuresed with IV Lasix, he was -4.8 L  -Subsequently had some worsening of his creatinine and drop in blood pressure,  hence IV diuretics were held, lisinopril stopped -Still continues to have significant lower extremity edema which is in part secondary to third spacing from hypoalbuminemia and progressive cancer  -Poor prognosis, DC home with hospice   Acute on chronic anemia -Has baseline normocytic anemia likely from  chronic disease secondary to malignancy -Worsened by hemodilution -Transfused 2 units of PRBCs  -Anemia panel suggestive of chronic disease, hemoglobin has stabilized since then  Metastatic prostate cancer, castration resistant -Used to be followed by Dr. Alen Blew, progressed on third line salvage therapy -Per Dr. Alen Blew in January patient has end-stage disease, and recommended hospice  -He is not a candidate for any further treatment -Continue oxycodone for bone pain, dose increased to 20mg  Q4 prn -Palliative consulted for goals of care, patient refused to discuss his cancer, prognosis, plan was for palliative care follow-up, -However patient continued to decline, we had more discussions regarding limitations of options and strong recommendations for comfort focused care -Daughter understands poor prognosis and is agreeable to discharge home with hospice  Recent pathologic fracture complicated by abscess status post debridement -Stable  Constipation -Continue MiraLAX, Senokot  Thrombocytopenia, 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  No obvious bleeding.  Jaundice, worsening LFTs -Secondary to progressive liver metastasis  Hypertension  Severe protein calorie malnutrition  Adult failure to thrive  Discharge Exam: Vitals:   01/18/20 0800 01/18/20 1207  BP:  112/65  Pulse:  (!) 110  Resp:  17  Temp:  98.6 F (37 C)  SpO2: 100% 97%    General: Extremely cachectic chronically ill appearing male AAOx2 Cardiovascular: S1S2/RRR Respiratory: Decreased BS at bases  Discharge Instructions   Discharge Instructions    Diet - low sodium heart healthy   Complete by: As directed    Increase activity slowly   Complete by: As directed      Allergies as of 01/18/2020   No Known Allergies     Medication List    STOP taking these medications   aspirin EC 81 MG tablet   calcium-vitamin D 500-200 MG-UNIT tablet Commonly known as: OSCAL  WITH D    cephALEXin 500 MG capsule Commonly known as: Keflex   ibuprofen 800 MG tablet Commonly known as: ADVIL   lisinopril 10 MG tablet Commonly known as: ZESTRIL   oxyCODONE-acetaminophen 5-325 MG tablet Commonly known as: Percocet   vitamin B-12 1000 MCG tablet Commonly known as: CYANOCOBALAMIN   Vitamin D3 25 MCG (1000 UT) Caps   zinc sulfate 220 (50 Zn) MG capsule     TAKE these medications   furosemide 20 MG tablet Commonly known as: LASIX Take 20 mg by mouth daily.   methocarbamol 750 MG tablet Commonly known as: ROBAXIN Take 1 tablet (750 mg total) by mouth 2 (two) times daily as needed for muscle spasms.   Oxycodone HCl 20 MG Tabs Take 1 tablet (20 mg total) by mouth every 4 (four) hours as needed for breakthrough pain.   polyethylene glycol 17 g packet Commonly known as: MIRALAX / GLYCOLAX Take 17 g by mouth daily as needed for mild constipation or moderate constipation.   senna 8.6 MG Tabs tablet Commonly known as: SENOKOT Take 1 tablet (8.6 mg total) by mouth at bedtime.      No Known Allergies Follow-up Information    [name], MD.   Specialty: Internal Medicine Contact information: [address] [place] [address] [phone]        [facility] Follow up.   Specialty: Home Health Services Why: [facility] Contact information: [place] [address] [phone]            The results of significant diagnostics from this hospitalization (including imaging, microbiology, ancillary and laboratory) are listed below for reference.    Significant Diagnostic Studies: CT ABDOMEN PELVIS WO CONTRAST  Result Date: 01/17/2020 CLINICAL DATA:  Left lower quadrant pain, history of prostate carcinoma EXAM: CT ABDOMEN AND PELVIS WITHOUT CONTRAST TECHNIQUE: Multidetector CT imaging of the abdomen and pelvis was performed following the standard protocol without IV contrast. COMPARISON:   12/15/2018, 08/03/2019 FINDINGS: Lower chest: Lung bases show scattered nodules consistent with metastatic disease new from the prior exam. Small right-sided pleural effusion is noted Hepatobiliary: The liver demonstrates innumerable rounded hypodensities throughout the liver consistent with metastatic disease. These have increased in the interval from the prior exam. Gallbladder is decompressed. Pancreas: Unremarkable. No pancreatic ductal dilatation or surrounding inflammatory changes. Spleen: Normal in size without focal abnormality. Adrenals/Urinary Tract: Adrenal glands are within normal limits bilaterally. Stable right renal cyst is seen. No renal calculi or obstructive changes are seen. The bladder is partially distended. Stomach/Bowel: No obstructive or inflammatory changes of the colon or small bowel are seen. The appendix is not well visualized although no inflammatory changes to suggest appendicitis are noted. Stomach is decompressed. Vascular/Lymphatic: Aortic atherosclerosis. No enlarged abdominal or pelvic lymph nodes. Reproductive: Prostate is stable in appearance. Other: Minimal free fluid is noted within the pelvis new from the prior exam. Musculoskeletal: Diffuse sclerotic metastatic disease is noted throughout the visualized bony structures. No compression deformity is noted. Changes of prior left femoral fracture with medullary rod and fixation screws are seen. Surrounding the proximal left femur there are multiple areas of decreased attenuation in the surrounding musculature as well as in the subcutaneous tissues laterally. The subcutaneous collection measures 4.8 x 4.3 cm. This likely represents postoperative seroma/hematoma from a prior surgical intervention in the left femur. IMPRESSION: Changes consistent with metastatic disease from the patient's known prostate carcinoma. The degree of hepatic metastatic disease  has increased in the interval from the prior exam. Additionally there are new  pulmonary nodules increased from the prior exam consistent with metastatic disease. Diffuse widespread bony metastatic disease is noted. Changes consistent with prior left femoral fracture and fixation. Fluid collections are noted in the surrounding musculature and subcutaneous tissues likely related to the prior surgery. Electronically Signed   By: Inez Catalina M.D.   On: 01/17/2020 19:04   DG Chest Port 1 View  Result Date: 01/10/2020 CLINICAL DATA:  Leg edema EXAM: PORTABLE CHEST 1 VIEW COMPARISON:  10/20/2019 FINDINGS: Mild cardiomegaly. Stable unfolded aorta. Small right-sided pleural effusion. Heterogeneous sclerosis consistent with skeletal metastatic disease. Possible healing left eighth rib fracture. IMPRESSION: 1. Cardiomegaly with small right-sided pleural effusion. 2. Heterogeneous sclerosis involving the ribs, spine and shoulders consistent with diffuse skeletal metastatic disease. Electronically Signed   By: Donavan Foil M.D.   On: 01/10/2020 21:54   DG Abd Portable 1V  Result Date: 01/17/2020 CLINICAL DATA:  Acute onset abdominal pain this morning. History of metastatic prostate cancer. EXAM: PORTABLE ABDOMEN - 1 VIEW COMPARISON:  Whole-body bone scan 08/03/2019 FINDINGS: The bowel gas pattern is normal. No radio-opaque calculi or other significant radiographic abnormality are seen. Bones are diffusely sclerotic. IMPRESSION: No acute abnormality.  No evidence of obstruction. Diffuse bony sclerosis consistent with known metastatic prostate cancer. Electronically Signed   By: Inge Rise M.D.   On: 01/17/2020 12:04   XR FEMUR MIN 2 VIEWS LEFT  Result Date: 01/04/2020 Stable fixation.  Fracture appears to be healing with some consolidation at the inferior neck  XR FEMUR MIN 2 VIEWS LEFT  Result Date: 12/28/2019 Stable alignment of the fracture without hardware complication.  VAS [place] LOWER EXTREMITY VENOUS (DVT)  Result Date: 01/17/2020  Lower Venous DVTStudy Indications: Swelling,  and Edema.  Comparison Study: no prior Performing Technologist: Abram Sander RVS  Examination Guidelines: A complete evaluation includes B-mode imaging, spectral Doppler, color Doppler, and power Doppler as needed of all accessible portions of each vessel. Bilateral testing is considered an integral part of a complete examination. Limited examinations for reoccurring indications may be performed as noted. The reflux portion of the exam is performed with the patient in reverse Trendelenburg.  +---------+---------------+---------+-----------+----------+--------------+ RIGHT    CompressibilityPhasicitySpontaneityPropertiesThrombus Aging +---------+---------------+---------+-----------+----------+--------------+ CFV      Full           Yes      Yes                                 +---------+---------------+---------+-----------+----------+--------------+ SFJ      Full                                                        +---------+---------------+---------+-----------+----------+--------------+ FV Prox  Full                                                        +---------+---------------+---------+-----------+----------+--------------+ FV Mid   Full                                                        +---------+---------------+---------+-----------+----------+--------------+  FV DistalFull                                                        +---------+---------------+---------+-----------+----------+--------------+ PFV      Full                                                        +---------+---------------+---------+-----------+----------+--------------+ POP      Full           Yes      Yes                                 +---------+---------------+---------+-----------+----------+--------------+ PTV      Full                                                        +---------+---------------+---------+-----------+----------+--------------+ PERO      Full                                                        +---------+---------------+---------+-----------+----------+--------------+   +---------+---------------+---------+-----------+----------+-------------------+ LEFT     CompressibilityPhasicitySpontaneityPropertiesThrombus Aging      +---------+---------------+---------+-----------+----------+-------------------+ CFV      Full           Yes      Yes                                      +---------+---------------+---------+-----------+----------+-------------------+ SFJ      Full                                                             +---------+---------------+---------+-----------+----------+-------------------+ FV Prox  Full                                                             +---------+---------------+---------+-----------+----------+-------------------+ FV Mid   Full                                                             +---------+---------------+---------+-----------+----------+-------------------+ FV Distal               Yes  Yes                  unable to tolerate                                                        compression         +---------+---------------+---------+-----------+----------+-------------------+ PFV      Full                                                             +---------+---------------+---------+-----------+----------+-------------------+ POP      Full           Yes      Yes                                      +---------+---------------+---------+-----------+----------+-------------------+ PTV      Full                                                             +---------+---------------+---------+-----------+----------+-------------------+ PERO     Full                                                             +---------+---------------+---------+-----------+----------+-------------------+     Summary:  BILATERAL: - No evidence of deep vein thrombosis seen in the lower extremities, bilaterally.   *See table(s) above for measurements and observations. Electronically signed by Ruta Hinds MD on 01/17/2020 at 3:23:23 PM.    Final     Microbiology: Recent Results (from the past 240 hour(s))  SARS CORONAVIRUS 2 (TAT 6-24 HRS) Nasopharyngeal Nasopharyngeal Swab     Status: None   Collection Time: 01/10/20  9:47 PM   Specimen: Nasopharyngeal Swab  Result Value Ref Range Status   SARS Coronavirus 2 NEGATIVE NEGATIVE Final    Comment: (NOTE) SARS-CoV-2 target nucleic acids are NOT DETECTED. The SARS-CoV-2 RNA is generally detectable in upper and lower respiratory specimens during the acute phase of infection. Negative results do not preclude SARS-CoV-2 infection, do not rule out co-infections with other pathogens, and should not be used as the sole basis for treatment or other patient management decisions. Negative results must be combined with clinical observations, patient history, and epidemiological information. The expected result is Negative. Fact Sheet for Patients: SugarRoll.be Fact Sheet for Healthcare Providers: https://www.woods-mathews.com/ This test is not yet approved or cleared by the [place] FDA and  has been authorized for detection and/or diagnosis of SARS-CoV-2 by FDA under an Emergency Use Authorization (EUA). This EUA will remain  in effect (meaning this test can be used) for the duration of the COVID-19 declaration under Section 56 4(b)(1) of the Act, 21  U.S.C. section 360bbb-3(b)(1), unless the authorization is terminated or revoked sooner. Performed at [facility], [place] [address], [place], [place] [address]      Labs: Basic Metabolic Panel: Recent Labs  Lab 01/14/20 1121 01/15/20 0448 01/16/20 0420 01/17/20 0430 01/18/20 0829  NA 136 138 135 137 135  K 4.7 5.2* 4.8 4.0 4.5  CL 99 100 96* 97* 94*  CO2  23 22 24 23 23   GLUCOSE 155* 118* 104* 111* 160*  BUN 30* 38* 39* 44* 46*  CREATININE 1.23 1.32* 1.28* 1.42* 1.48*  CALCIUM 9.0 8.8* 8.8* 8.7* 9.0   Liver Function Tests: Recent Labs  Lab 01/12/20 0622  AST 210*  ALT 61*  ALKPHOS 932*  BILITOT 3.2*  PROT 5.5*  ALBUMIN 2.1*   No results for input(s): LIPASE, AMYLASE in the last 168 hours. No results for input(s): AMMONIA in the last 168 hours. CBC: Recent Labs  Lab 01/14/20 0431 01/15/20 0448 01/16/20 0420 01/17/20 0430 01/18/20 0829  WBC 11.5* 10.8* 11.2* 11.6* 14.0*  HGB 9.5* 8.7* 8.8* 8.4* 8.2*  HCT 29.6* 27.3* 27.9* 26.6* 25.9*  MCV 91.4 92.5 91.8 93.0 91.8  PLT 48* 44* 48* 53* 66*   Cardiac Enzymes: No results for input(s): CKTOTAL, CKMB, CKMBINDEX, TROPONINI in the last 168 hours. BNP: BNP (last 3 results) Recent Labs    01/10/20 1824  BNP 154.8*    ProBNP (last 3 results) No results for input(s): PROBNP in the last 8760 hours.  CBG: No results for input(s): GLUCAP in the last 168 hours.     Signed:  Domenic Polite MD.  [facility] 01/18/2020, 1:44 PM

## 2020-01-18 NOTE — TOC Transition Note (Addendum)
Transition of Care [facility]) - CM/SW Discharge Note   Patient Details  Name: Austin Oconnor MRN: [id number] Date of Birth: February 23, 1941  Transition of Care [facility]) CM/SW Contact:  Zenon Mayo, RN Phone Number: 01/18/2020, 2:05 PM   Clinical Narrative:    Patient is for dc home today, MD has spoken with daughter, Shelton Silvas.  NCM contacted Sula Soda with [facility] hospice, she states patient does not want a hospital bed at this time, he has a walker and a w/chair at home.  He will need to go by ptar.  NCM left Shelton Silvas a message for return call to see what time she would like to receive patient by ptar.  Awaiting call back. NCM received call back.  Shelton Silvas states he does not want to ride in the ambulance, states she will be picking him up this evening when she comes back to [place] today.  NCM informed Sula Soda with [facility] hospice of this information also.   Per Staff RN Shelton Silvas states her brother will be coming to pick patient up at 4pm today.   Final next level of care: Home w Hospice Care Barriers to Discharge: No Barriers Identified   Patient Goals and CMS Choice Patient states their goals for this hospitalization and ongoing recovery are:: home with hospice CMS Medicare.gov Compare Post Acute Care list provided to:: Patient Represenative (must comment)(daughter) Choice offered to / list presented to : Adult Children([place])  Discharge Placement                       Discharge Plan and Services     Post Acute Care Choice: Home Health          DME Arranged: ([place] will arrange DME)         HH Arranged: RN [place] Agency: ([place]) Date [place]: 01/17/20 Time [place]: 1300 Representative spoke with at [place]: [place] Determinants of Health ([place]) Interventions     Readmission Risk Interventions No flowsheet data found.

## 2020-01-19 ENCOUNTER — Ambulatory Visit: Payer: Medicare Other

## 2020-01-20 ENCOUNTER — Ambulatory Visit: Payer: Medicare Other

## 2020-01-21 ENCOUNTER — Ambulatory Visit: Payer: Medicare Other

## 2020-01-21 ENCOUNTER — Encounter: Payer: Self-pay | Admitting: Radiation Oncology

## 2020-01-24 ENCOUNTER — Ambulatory Visit: Payer: Medicare Other

## 2020-01-25 ENCOUNTER — Ambulatory Visit: Payer: Medicare Other

## 2020-01-25 NOTE — Progress Notes (Signed)
  Radiation Oncology         [phone] ________________________________  Name: Austin Oconnor MRN: [id number]  Date: 01/21/2020  DOB: [birth year]  Chart Note:  This patient was set up for left hip irradiation following surgical stabilization.  The initiation of his treatment was delayed due to some ongoing wound healing.  However, his condition declined requiring hospital admission and due to declining performance status, the patient's goals of care were transitioned from active palliative treatment to comfort care with discharge to home.  Accordingly, his course of radiation therapy was canceled.  I would be happy to be involved in his care in the future if clinically indicated.  ________________________________  Sheral Apley. Tammi Klippel, M.D.

## 2020-01-26 ENCOUNTER — Ambulatory Visit: Payer: Medicare Other

## 2020-01-26 ENCOUNTER — Telehealth: Payer: Self-pay | Admitting: Internal Medicine

## 2020-01-26 NOTE — Telephone Encounter (Signed)
Phone call placed to patient to offer to schedule a visit with [facility]. Phone rang, with no answer I left a voicemail for call back. 

## 2020-01-27 ENCOUNTER — Ambulatory Visit: Payer: Medicare Other

## 2020-01-28 ENCOUNTER — Ambulatory Visit: Payer: Medicare Other

## 2020-01-31 ENCOUNTER — Ambulatory Visit: Payer: Medicare Other

## 2020-02-01 ENCOUNTER — Ambulatory Visit: Payer: Medicare Other

## 2020-02-04 ENCOUNTER — Telehealth: Payer: Self-pay | Admitting: Internal Medicine

## 2020-02-04 NOTE — Telephone Encounter (Signed)
Phone call placed to patient to offer to schedule a visit with [facility]. Phone rang, with no answer and voicemail box was full. 

## 2020-02-10 DEATH — deceased

## 2020-07-02 IMAGING — NM NM BONE WHOLE BODY
2 series · 2 of 2 positions shown · non-contrast
Comparison: 01/26/2018

Correlation: CT abdomen pelvis 12/15/2018

CLINICAL DATA: Metastatic prostate cancer, advanced disease, CRPC,
PSA = 14 on 09/16/2018

EXAM:
NUCLEAR MEDICINE WHOLE BODY BONE SCAN
TECHNIQUE: Whole body anterior and posterior images were obtained approximately
3 hours after intravenous injection of radiopharmaceutical.
RADIOPHARMACEUTICALS:  21.2 mCi Kechnetium-PPm MDP IV

[Series 1: whole body · 2.66mm/px · 1 of 1 slices shown (1 of 2)]
[im 1/1]
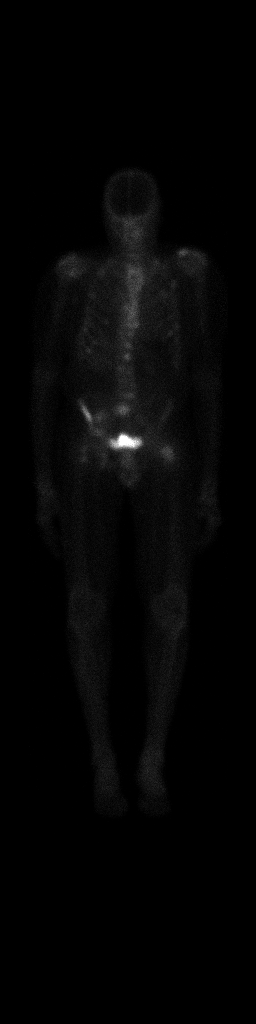

[Series 1: whole body · 2.66mm/px · 1 of 1 slices shown (2 of 2)]
[im 1/1]
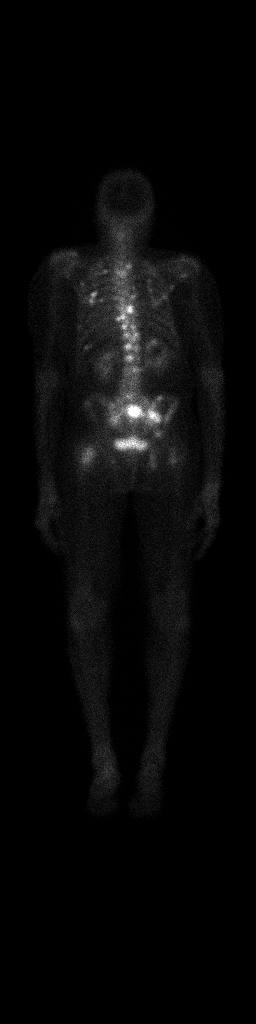

[2 of 2 positions shown; findings below may reference images not displayed]

FINDINGS: Multiple sites of abnormal osseous tracer accumulation are
identified consistent with widespread osseous metastatic disease
markedly progressive since previous exam.

These include the sternum, BILATERAL scapula, BILATERAL anterior and
posterior ribs, thoracic and lumbar spine, pelvis, and BILATERAL
proximal femora.

Questionable foci of increased tracer localization at proximal and
mid LEFT humeral diaphysis. Expected urinary tract and soft tissue
distribution of tracer.
IMPRESSION: Widespread osseous metastatic disease markedly progressive since
prior study.

This includes progressive sites of uptake at the BILATERAL proximal
femora and suspect the LEFT humerus.

## 2021-02-18 IMAGING — NM NM BONE WHOLE BODY
2 series · 2 of 2 positions shown · non-contrast
Comparison: Whole-body bone scan 12/15/2018 and 01/26/2018. CT
abdomen and pelvis 12/15/2018.

CLINICAL DATA: History of metastatic prostate cancer. Elevated PSA.

EXAM:
NUCLEAR MEDICINE WHOLE BODY BONE SCAN
TECHNIQUE: Whole body anterior and posterior images were obtained approximately
3 hours after intravenous injection of radiopharmaceutical.
RADIOPHARMACEUTICALS:  21.8 mCi Bechnetium-NNm MDP IV

[Series 1: whole body · 2.66mm/px · 1 of 1 slices shown (1 of 2)]
[im 1/1]
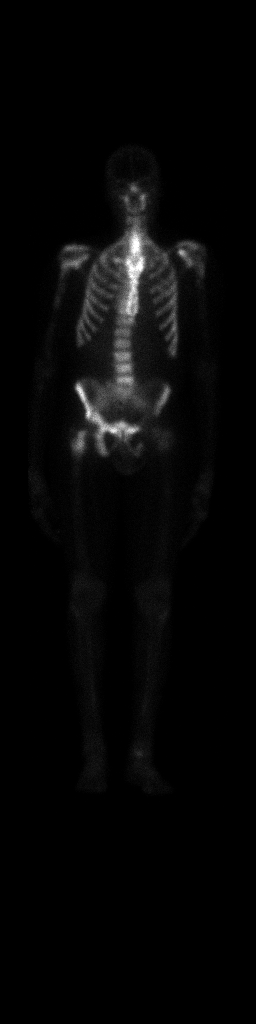

[Series 1: whole body · 2.66mm/px · 1 of 1 slices shown (2 of 2)]
[im 1/1]
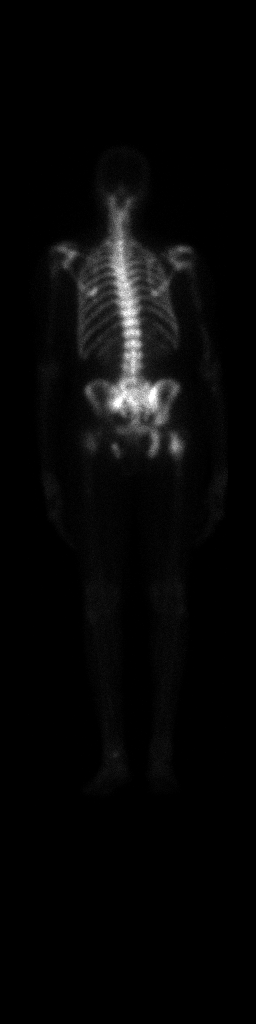

[2 of 2 positions shown; findings below may reference images not displayed]

FINDINGS: Multiple sites of abnormal radiotracer uptake are identified. The
appearance is worse than on the most recent bone scan with increased
activity most notable in the intertrochanteric femur on the right,
right superior and inferior pubic rami, right ilium, lower lumbar
spine, innumerable ribs, sternum and proximal humeri bilaterally.
Activity in the sacrum is also more intense than on the prior exam.
The kidneys are barely perceptible.
IMPRESSION: Marked progression of widespread osseous metastatic disease since
the most recent examination.
